# Patient Record
Sex: Male | Born: 1970 | Race: White | Hispanic: No | Marital: Married | Smoking: Never smoker
Health system: Southern US, Community
[De-identification: ages and names within clinical notes are randomized; demographics above are authoritative.]

## PROBLEM LIST (undated history)

## (undated) DIAGNOSIS — Z8489 Family history of other specified conditions: Secondary | ICD-10-CM

## (undated) DIAGNOSIS — I4510 Unspecified right bundle-branch block: Secondary | ICD-10-CM

## (undated) DIAGNOSIS — E785 Hyperlipidemia, unspecified: Secondary | ICD-10-CM

## (undated) DIAGNOSIS — Z86010 Personal history of colonic polyps: Principal | ICD-10-CM

## (undated) DIAGNOSIS — I341 Nonrheumatic mitral (valve) prolapse: Secondary | ICD-10-CM

## (undated) DIAGNOSIS — T4145XA Adverse effect of unspecified anesthetic, initial encounter: Secondary | ICD-10-CM

## (undated) DIAGNOSIS — G5600 Carpal tunnel syndrome, unspecified upper limb: Secondary | ICD-10-CM

## (undated) DIAGNOSIS — R011 Cardiac murmur, unspecified: Secondary | ICD-10-CM

## (undated) DIAGNOSIS — K625 Hemorrhage of anus and rectum: Secondary | ICD-10-CM

## (undated) DIAGNOSIS — G4730 Sleep apnea, unspecified: Secondary | ICD-10-CM

## (undated) HISTORY — DX: Hyperlipidemia, unspecified: E78.5

## (undated) HISTORY — PX: VASECTOMY: SHX75

## (undated) HISTORY — DX: Nonrheumatic mitral (valve) prolapse: I34.1

## (undated) HISTORY — PX: EYE SURGERY: SHX253

## (undated) HISTORY — DX: Personal history of colonic polyps: Z86.010

## (undated) HISTORY — DX: Cardiac murmur, unspecified: R01.1

## (undated) HISTORY — PX: COLONOSCOPY: SHX174

---

## 1994-12-12 DIAGNOSIS — T8859XA Other complications of anesthesia, initial encounter: Secondary | ICD-10-CM

## 1994-12-12 HISTORY — DX: Other complications of anesthesia, initial encounter: T88.59XA

## 1994-12-12 HISTORY — PX: NASAL TURBINATE REDUCTION: SHX2072

## 2004-12-12 HISTORY — PX: LASIK: SHX215

## 2006-04-22 ENCOUNTER — Observation Stay: Payer: Self-pay | Admitting: Vascular Surgery

## 2008-01-03 ENCOUNTER — Emergency Department (HOSPITAL_COMMUNITY): Admission: EM | Admit: 2008-01-03 | Discharge: 2008-01-03 | Payer: Self-pay | Admitting: Emergency Medicine

## 2009-09-28 ENCOUNTER — Ambulatory Visit: Payer: Self-pay | Admitting: Internal Medicine

## 2009-09-30 LAB — CONVERTED CEMR LAB
Direct LDL: 142.3 mg/dL
Glucose, Bld: 78 mg/dL (ref 70–99)
HDL: 35.9 mg/dL — ABNORMAL LOW (ref >39.00)
Triglycerides: 301 mg/dL — ABNORMAL HIGH (ref 0.0–149.0)
VLDL: 60.2 mg/dL — ABNORMAL HIGH (ref 0.0–40.0)

## 2010-04-22 ENCOUNTER — Ambulatory Visit: Payer: Self-pay | Admitting: Family Medicine

## 2010-04-22 DIAGNOSIS — IMO0002 Reserved for concepts with insufficient information to code with codable children: Secondary | ICD-10-CM | POA: Insufficient documentation

## 2010-09-30 ENCOUNTER — Telehealth: Payer: Self-pay | Admitting: Internal Medicine

## 2010-10-18 ENCOUNTER — Encounter: Payer: Self-pay | Admitting: Internal Medicine

## 2010-12-29 ENCOUNTER — Emergency Department (HOSPITAL_COMMUNITY)
Admission: EM | Admit: 2010-12-29 | Discharge: 2010-12-29 | Payer: Self-pay | Source: Home / Self Care | Admitting: Emergency Medicine

## 2011-01-11 NOTE — Miscellaneous (Signed)
Summary: Flu vaccine  Clinical Lists Changes  Observations: Added new observation of FLU VAX: Historical (10/13/2010 16:23)      Influenza Immunization History:    Influenza # 1:  Historical (10/13/2010) Received from from [facility] [address], [place], [place]

## 2011-01-11 NOTE — Assessment & Plan Note (Signed)
Summary: RIGHT KNEE PAIN/CLE   Vital Signs:  Patient profile:   40 year old male Height:      73 inches Weight:      293.6 pounds BMI:     38.88 Temp:     98.2 degrees F oral Pulse rhythm:   regular BP sitting:   118 / 82  (left arm) Cuff size:   large  Vitals Entered By: Benny Lennert CMA Duncan Dull) (Apr 22, 2010 3:52 PM)  History of Present Illness: Chief complaint right knee pain  the patient is a very friendly [place] gentleman who struck his right knee approximately 5 years ago, and after then, he subsequently developed medial knee pain.  Intermittently, this has not bothered him always, however he has gained approximately 50 pounds since he got married.  He does work in an [employer], and stoops and squats every day. He is unable to go down on his right knee, and has pain almost always when he flexes his right knee.  He was actually evaluated in  [place] previously, and he had a knee MRI that point, which showed his medial meniscal tear.  I confirmed this over the telephone with his wife, who is a  Primary school teacher.  The actual films and his report are not available for my review.  Now he has had worsening knee pain. He does describe 2 instances when it had mechanically locked up.  Does have pain going up and down stairs, does have pain with flexing and  twisting his knee.  Allergies (verified): No Known Drug Allergies  Past History:  Past medical, surgical, family and social histories (including risk factors) reviewed, and no changes noted (except as noted below).  Past Medical History: Reviewed history from 09/28/2009 and no changes required. Unremarkable  Past Surgical History: Reviewed history from 09/28/2009 and no changes required. 1996 Turbinate reduction 2006 Lasik  Family History: Reviewed history from 09/28/2009 and no changes required. Mom has multiple sclerosis--disabled now Dad healthy 1 sister--healthy 2 children with  asthma  Social History: Reviewed history from 09/28/2009 and no changes required. 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 Otherwise the pertinent positives of the ROS are noted above.    Physical Exam  General:  GEN: Well-developed,well-nourished,in no acute distress; alert,appropriate and cooperative throughout examination HEENT: Normocephalic and atraumatic without obvious abnormalities. No apparent alopecia or balding. Ears, externally no deformities PULM: Breathing comfortably in no respiratory distress EXT: No clubbing, cyanosis, or edema PSYCH: Normally interactive. Cooperative during the interview. Pleasant. Friendly and conversant. Not anxious or depressed appearing. Normal, full affect.  Msk:  right knee: Full extension. Flexion to approximately 120.  Nontender with patellar facet loading.  Negative grind test.  Nontender lateral joint line. Medial joint line is tender, notably posteriorly.  Strength is intact, 5/5.  Negative Lachman. Stable to varus and about distress. Negative anterior and posterior drawer  positive flexion pinch test.  Positive McMurray's test. The mcmurrays positive for pain, but no mechanical symptoms are felt.   Impression & Recommendations:  Problem # 1:  TEAR MEDIAL CARTILAGE OR MENISCUS KNEE CURRENT (ICD-836.0) DOI approx 04/11/2005  >25 minutes spent in face to face time with patient, >50% spent in counselling or coordination  of care:  I extensively reviewed the anatomy and different treatment options. Discussed the case with the patient and his wife.  This highly active 65 year old gentleman. He is a known medial meniscal  tear defined on MRI. I suspect that this is still present, and may have enlarged. Classic history and exam.  At this point, I believe it would be most helpful to discuss this case with Orthopedic Surgery, consult Dr. Ave Filter for evaluation and definitive management.  Orders: Orthopedic Surgeon Referral (Ortho Surgeon)  Patient Instructions: 1)  Referral Appointment Information 2)  Day/Date: 3)  Time: 4)  Place/MD: 5)  Address: 6)  Phone/Fax: 7)  Patient given appointment information. Information/Orders faxed/mailed.   Prior Medications (reviewed today): None Current Allergies (reviewed today): No known allergies

## 2011-01-11 NOTE — Progress Notes (Signed)
Summary: Orthopedic appt cancelled....  Phone Note Outgoing Call   Summary of Call: Othop appt cancelled. Pt was referred to a othop physician in May for his knee. Pt cancelled appt. I spoke w/ the pt says his knee is much better, no problems.Daine Gip  September 30, 2010 11:36 AM   Follow-up for Phone Call        noted Follow-up by: Cindee Salt MD,  September 30, 2010 1:28 PM

## 2011-04-08 ENCOUNTER — Encounter: Payer: Self-pay | Admitting: Internal Medicine

## 2011-04-11 ENCOUNTER — Ambulatory Visit: Payer: Self-pay | Admitting: Internal Medicine

## 2011-04-18 ENCOUNTER — Ambulatory Visit (INDEPENDENT_AMBULATORY_CARE_PROVIDER_SITE_OTHER): Payer: Self-pay | Admitting: Internal Medicine

## 2011-04-18 ENCOUNTER — Encounter: Payer: Self-pay | Admitting: Internal Medicine

## 2011-04-18 VITALS — BP 120/80 | HR 62 | Temp 98.5°F | Ht 73.0 in | Wt 296.0 lb

## 2011-04-18 DIAGNOSIS — M7520 Bicipital tendinitis, unspecified shoulder: Secondary | ICD-10-CM

## 2011-04-18 DIAGNOSIS — F4329 Adjustment disorder with other symptoms: Secondary | ICD-10-CM

## 2011-04-18 DIAGNOSIS — F4323 Adjustment disorder with mixed anxiety and depressed mood: Secondary | ICD-10-CM

## 2011-04-18 NOTE — Patient Instructions (Signed)
Please try 1-2 aleve twice a day and heat on the tendonitis area  Please call if your mood issues worsen, we can consider a trial of medication

## 2011-04-18 NOTE — Progress Notes (Signed)
  Subjective:    Patient ID: [name], male    DOB: 10-04-1971, 40 y.o.   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 Went back. Wife upset that she didn't go-- "who would watch the kids" Finds himself snapping at kids easy Getting upset easy Some degreee of anhedonia Sleeping okay Overeating with the stress  Current outpatient prescriptions:naproxen (NAPROSYN) 250 MG tablet, Take 250 mg by mouth 2 (two) times daily with a meal.  , Disp: , Rfl:   No past medical history on file.  Past Surgical History  Procedure Date  . Nasal turbinate reduction 1996  . Lasik 2006    Family History  Problem Relation Age of Onset  . Multiple sclerosis Mother     History   Social History  . Marital Status: Married    Spouse Name: N/A    Number of Children: N/A  . Years of Education: N/A   Occupational History  . Not on file.   Social History Main Topics  . Smoking status: Never Smoker   . Smokeless tobacco: Never Used  . Alcohol Use: Yes     occasional beer/wine  . Drug Use: Not on file  . Sexually Active: Not on file   Other Topics Concern  . Not on file   Social History Narrative  . No narrative on file   Review of Systems No other sig joint problems--does have occ knee pain Weight up 11# in a year     Objective:   Physical Exam  Constitutional: He appears well-developed and well-nourished. No distress.  Musculoskeletal:       Pain over biceps tendon on  right Normal ROM in elbow No bony tenderness  Neurological:       Normal strength in arms  Psychiatric: His behavior is normal. Judgment and thought content normal.       Melancholy Grieving for mom          Assessment & Plan:

## 2011-04-21 ENCOUNTER — Encounter: Payer: Self-pay | Admitting: Internal Medicine

## 2011-04-21 ENCOUNTER — Ambulatory Visit (INDEPENDENT_AMBULATORY_CARE_PROVIDER_SITE_OTHER): Payer: Self-pay | Admitting: Internal Medicine

## 2011-04-21 VITALS — BP 110/80 | HR 74 | Temp 99.0°F | Wt 294.0 lb

## 2011-04-21 DIAGNOSIS — R10A1 Flank pain, right side: Secondary | ICD-10-CM

## 2011-04-21 DIAGNOSIS — M549 Dorsalgia, unspecified: Secondary | ICD-10-CM

## 2011-04-21 DIAGNOSIS — R109 Unspecified abdominal pain: Secondary | ICD-10-CM

## 2011-04-21 LAB — POCT URINALYSIS DIPSTICK
Bilirubin, UA: NEGATIVE
Blood, UA: NEGATIVE
Ketones, UA: NEGATIVE
Protein, UA: NEGATIVE
pH, UA: 7

## 2011-04-21 NOTE — Patient Instructions (Signed)
Call if your pain comes back again

## 2011-04-21 NOTE — Progress Notes (Signed)
  Subjective:    Patient ID: [name], male    DOB: [birth year], 40 y.o.   MRN: [id number]  HPI Felt sharp pain and nausea in left upper back 2 nights ago Seemed to radiate down towards lower abdomen Continued to yesterday morning  Now better No history of kidney stones No hematuria  Did have some pain this AM None right now  Came on first in shower May be more when stretching arm up---not always consistent Working in [employer] yesterday and today Intermittent yesterday but better since the AM  No dysuria or urinary changes  Current outpatient prescriptions:naproxen (NAPROSYN) 250 MG tablet, Take 250 mg by mouth 2 (two) times daily with a meal.  , Disp: , Rfl:   No past medical history on file.  Past Surgical History  Procedure Date  . Nasal turbinate reduction 1996  . Lasik 2006    Family History  Problem Relation Age of Onset  . Multiple sclerosis Mother     History   Social History  . Marital Status: Married    Spouse Name: N/A    Number of Children: N/A  . Years of Education: N/A   Occupational History  . Not on file.   Social History Main Topics  . Smoking status: Never Smoker   . Smokeless tobacco: Never Used  . Alcohol Use: Yes     occasional beer/wine  . Drug Use: Not on file  . Sexually Active: Not on file   Other Topics Concern  . Not on file   Social History Narrative  . No narrative on file   Review of Systems No cough Did feel briefly SOB with the pain occ (very transient) Appetite is not changed    Objective:   Physical Exam  Constitutional: He appears well-developed and well-nourished. No distress.  Abdominal: Soft. He exhibits no mass. There is no tenderness.  Musculoskeletal:       No CVA tenderness now (but pain spot is right CVA area)  Normal gait and strength          Assessment & Plan:

## 2011-06-30 ENCOUNTER — Other Ambulatory Visit: Payer: Self-pay | Admitting: *Deleted

## 2011-06-30 MED ORDER — CLOBETASOL PROPIONATE 0.05 % EX OINT: TOPICAL_OINTMENT | Freq: Two times a day (BID) | CUTANEOUS | Status: DC | PRN

## 2011-06-30 NOTE — Telephone Encounter (Signed)
Rx sent electronically  Please let him know

## 2011-06-30 NOTE — Telephone Encounter (Signed)
Pt is requesting refill on clobetasol cream for his psoriasis.  This is not on med list, he states he has gotten it from you before.  Uses [facility] in [place].

## 2011-06-30 NOTE — Telephone Encounter (Signed)
Spoke with patient and advised results   

## 2011-09-02 LAB — POCT CARDIAC MARKERS
Operator id: 196461
Operator id: 196461
Troponin i, poc: <0.05
Troponin i, poc: <0.05

## 2011-09-02 LAB — DIFFERENTIAL
Basophils Absolute: 0
Basophils Relative: 0
Eosinophils Absolute: 0.1
Eosinophils Relative: 2
Monocytes Absolute: 0.3

## 2011-09-02 LAB — CBC
HCT: 45
Hemoglobin: 15.5
MCHC: 34.3
Platelets: 232
RDW: 12.4

## 2011-09-02 LAB — I-STAT 8, (EC8 V) (CONVERTED LAB)
Acid-Base Excess: 4 — ABNORMAL HIGH
Bicarbonate: 31.6 — ABNORMAL HIGH
Chloride: 105
TCO2: 33
pCO2, Ven: 56.9 — ABNORMAL HIGH
pH, Ven: 7.353 — ABNORMAL HIGH

## 2011-09-02 LAB — POCT I-STAT CREATININE: Operator id: 196461

## 2011-09-02 LAB — HEPATIC FUNCTION PANEL
ALT: 51
AST: 29
Albumin: 4.3
Total Bilirubin: 1

## 2011-12-26 ENCOUNTER — Ambulatory Visit: Payer: Self-pay | Admitting: Family Medicine

## 2011-12-26 ENCOUNTER — Encounter: Payer: Self-pay | Admitting: Internal Medicine

## 2011-12-26 ENCOUNTER — Ambulatory Visit (INDEPENDENT_AMBULATORY_CARE_PROVIDER_SITE_OTHER): Payer: Self-pay | Admitting: Internal Medicine

## 2011-12-26 VITALS — BP 110/80 | HR 64 | Temp 98.0°F | Wt 294.0 lb

## 2011-12-26 DIAGNOSIS — Z733 Stress, not elsewhere classified: Secondary | ICD-10-CM

## 2011-12-26 DIAGNOSIS — F439 Reaction to severe stress, unspecified: Secondary | ICD-10-CM | POA: Insufficient documentation

## 2011-12-26 DIAGNOSIS — K625 Hemorrhage of anus and rectum: Secondary | ICD-10-CM

## 2011-12-26 NOTE — Assessment & Plan Note (Signed)
Not really upper GI Has some nausea but generally intestinal symptoms Rectal exam shows no hemorrhoids or apparent source for his bleeding  Very suspicious for colitis Will check labs Set up with GI

## 2011-12-26 NOTE — Progress Notes (Signed)
  Subjective:    Patient ID: [name], male    DOB: 07-04-1971, 41 y.o.   MRN: [id number]  HPI Here at wife's urging  His nerves are a problem Stomach acts up Wife is concerned due to inappropriate communication he had in the past with another woman He is open with shared e-mail account and she was upset about unprofessional nature of e-mail from lady realtor  Feels his wife may be self concious since she is post partum,etc She even gets upset if he works out ---wonders why he is trying to make himself look better---or at least he should be spending the time with her Not much privacy with all their children---teenager and baby!! 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    No Known Allergies  No past medical history on file.  Past Surgical History  Procedure Date  . Nasal turbinate reduction 1996  . Lasik 2006    Family History  Problem Relation Age of Onset  . Multiple sclerosis Mother     History   Social History  . Marital Status: Married    Spouse Name: N/A    Number of Children: N/A  . Years of Education: N/A   Occupational History  . Not on file.   Social History Main Topics  . Smoking status: Never Smoker   . Smokeless tobacco: Never Used  . Alcohol Use: Yes     occasional beer/wine  . Drug Use: Not on file  . Sexually Active: Not on file   Other Topics Concern  .  Not on file   Social History Narrative  . No narrative on file   Review of Systems Still gets anxious and upset at times--esp at work Wife really watches over him --- observing while he is working, monitors the video New young woman employee--wife is watching and concerned due to past infidelity Moves his bowels frequently---at least 2-3 per day    Objective:   Physical Exam  Constitutional: He appears well-developed and well-nourished. No distress.  Neck: Normal range of motion. Neck supple. No thyromegaly present.  Cardiovascular: Normal rate, regular rhythm and normal heart sounds.  Exam reveals no gallop.   No murmur heard. Pulmonary/Chest: Effort normal and breath sounds normal. No respiratory distress. He has no wheezes. He has no rales.  Abdominal: Soft. Bowel sounds are normal. He exhibits no distension and no mass. There is no rebound and no guarding.       Mild RLQ tenderness and slightly greater LLQ tenderness  Musculoskeletal: He exhibits no edema and no tenderness.  Lymphadenopathy:    He has no cervical adenopathy.  Psychiatric:       Mild anxiety Doesn't seem to be depressed          Assessment & Plan:

## 2011-12-26 NOTE — Assessment & Plan Note (Signed)
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

## 2011-12-27 ENCOUNTER — Encounter: Payer: Self-pay | Admitting: Internal Medicine

## 2011-12-27 LAB — CBC WITH DIFFERENTIAL/PLATELET
Basophils Absolute: 0.1 10*3/uL (ref 0.0–0.1)
Eosinophils Absolute: 0.2 10*3/uL (ref 0.0–0.7)
HCT: 43 % (ref 39.0–52.0)
Lymphs Abs: 2.3 10*3/uL (ref 0.7–4.0)
MCV: 90.4 fl (ref 78.0–100.0)
Monocytes Absolute: 0.5 10*3/uL (ref 0.1–1.0)
Platelets: 232 10*3/uL (ref 150.0–400.0)
RDW: 12.1 % (ref 11.5–14.6)

## 2011-12-27 LAB — BASIC METABOLIC PANEL
BUN: 20 mg/dL (ref 6–23)
Chloride: 104 mEq/L (ref 96–112)
GFR: 85.55 mL/min (ref >60.00)
Glucose, Bld: 84 mg/dL (ref 70–99)
Potassium: 4.8 mEq/L (ref 3.5–5.1)

## 2011-12-27 LAB — TSH: TSH: 1.94 u[IU]/mL (ref 0.35–5.50)

## 2011-12-27 LAB — HEPATIC FUNCTION PANEL: Albumin: 4.6 g/dL (ref 3.5–5.2)

## 2012-01-03 ENCOUNTER — Encounter: Payer: Self-pay | Admitting: Internal Medicine

## 2012-01-03 ENCOUNTER — Ambulatory Visit (INDEPENDENT_AMBULATORY_CARE_PROVIDER_SITE_OTHER): Payer: Self-pay | Admitting: Internal Medicine

## 2012-01-03 DIAGNOSIS — R141 Gas pain: Secondary | ICD-10-CM

## 2012-01-03 DIAGNOSIS — R140 Abdominal distension (gaseous): Secondary | ICD-10-CM | POA: Insufficient documentation

## 2012-01-03 DIAGNOSIS — R198 Other specified symptoms and signs involving the digestive system and abdomen: Secondary | ICD-10-CM

## 2012-01-03 DIAGNOSIS — K625 Hemorrhage of anus and rectum: Secondary | ICD-10-CM

## 2012-01-03 MED ORDER — PEG-KCL-NACL-NASULF-NA ASC-C 100 G PO SOLR: 1.0000 | Freq: Once | ORAL | Status: DC

## 2012-01-03 NOTE — Assessment & Plan Note (Signed)
Still might be anorectal but the persistence and lack of hemorrhoids on exam warrants a colonoscopy. The patient understands and agrees to proceed.

## 2012-01-03 NOTE — Patient Instructions (Signed)
You have been scheduled for a Colonoscopy with separate instructions given. Your prep kit has been sent to your pharmacy for you to pick up. 

## 2012-01-03 NOTE — Progress Notes (Signed)
Subjective:    Patient ID: [name], male    DOB: Oct 25, 1971, 41 y.o.   MRN: [id number]  HPI This pleasant 41 year old white man presents for a 2-3 month history of intermittent rectal bleeding. His stools are more frequent and somewhat loose at times. Mostly the blood has been on the toilet paper or small amounts of the commode though 2 weeks ago he had a large amount of blood in the stool. It has been bright red. He saw primary care and no hemorrhoids were noted. He was sent here for further evaluation. These problems are accompanied by intermittent cramps and bloating. He can have these after eating, [place] food is a particular cause. He has not had that about a month. He says the bloating symptoms are not particularly bothersome and he can live with them but he is concerned about the bleeding. He's been on a probiotic for about 2 days, no change noted at this point. No Known Allergies No outpatient prescriptions prior to visit.   History reviewed. No pertinent past medical history. Past Surgical History  Procedure Date  . Nasal turbinate reduction 1996  . Lasik 2006    bilateral   History   Social History  . Marital Status: Married    Spouse Name: N/A    Number of Children: N/A  . 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 Smoking status: Never Smoker   . Smokeless tobacco: Never Used  . Alcohol Use: Yes     occasional beer/wine  . Drug Use: No  . Sexually Active: None   Other Topics Concern  . None   Social History Narrative  . None   Family History  Problem Relation Age of Onset  . Multiple sclerosis Mother   . Asthma      children; seasonal  . Colon cancer Neg Hx   . Colitis Father     questionable  . Arthritis Son     Wilder Glade rheumatoid         Review of Systems There is some interpersonal stress with his wife, she is recently postpartum, they have 4 children and there is  some stress there. She has some signs about his interactions with other females, and there has been instability in the past. He is having poor sleep at times and does have some anxiety overall this, admittedly. All other review of systems negative except as mentioned above.    Objective:   Physical Exam General:  Well-developed, well-nourished and in no acute distress Eyes:  anicteric. ENT:   Mouth and posterior pharynx free of lesions.  Neck:   supple w/o thyromegaly or mass.  Lungs: Clear to auscultation bilaterally. Heart:  S1S2, no rubs, murmurs, gallops. Abdomen:  soft, non-tender, no hepatosplenomegaly, hernia, or mass and BS+.  Rectal: Normal Tone and anoderm. Light brown stool. No mass. Prostate normal. Anoscopy   exam does not reveal obvious hemorrhoids. Lymph:  no cervical or supraclavicular adenopathy. Extremities:   no edema Skin   no rash. Neuro:  A&O x 3.  Psych:  appropriate mood and  Affect.   Data Reviewed: Recent CBC is normal, Dr. Alphonsus Sias notes are reviewed.       Assessment & Plan:   1. Change in bowel function - mild   2. Bloating   3. Rectal bleeding        Constellation of rectal bleeding, bloating and some change in  bowel habits. Etiology not clear. I couldn't find hemorrhoids though he could have those a little further up in the rectum. Given the overall constellation of problems, colonoscopy is recommended due to his chronic changes here, he could have some type of early inflammatory bowel disease problem, colorectal neoplasia is in the differential as well though less likely.The risks and benefits as well as alternatives of endoscopic procedure(s) have been discussed and reviewed. All questions answered. The patient agrees to proceed.  Is not interested in symptomatic therapy at this time. For the most part he is able to tolerate the bloating symptoms, he is most concerned about the bleeding and what it might represent.

## 2012-01-05 ENCOUNTER — Other Ambulatory Visit: Payer: Self-pay | Admitting: Internal Medicine

## 2012-01-19 ENCOUNTER — Encounter: Payer: Self-pay | Admitting: Internal Medicine

## 2012-01-19 ENCOUNTER — Ambulatory Visit (AMBULATORY_SURGERY_CENTER): Payer: Self-pay | Admitting: Internal Medicine

## 2012-01-19 VITALS — BP 124/72 | HR 73 | Temp 95.5°F | Resp 20 | Ht 76.0 in | Wt 294.0 lb

## 2012-01-19 DIAGNOSIS — D126 Benign neoplasm of colon, unspecified: Secondary | ICD-10-CM

## 2012-01-19 DIAGNOSIS — K921 Melena: Secondary | ICD-10-CM

## 2012-01-19 MED ORDER — SODIUM CHLORIDE 0.9 % IV SOLN: 500.0000 mL | INTRAVENOUS | Status: DC

## 2012-01-19 NOTE — Patient Instructions (Addendum)
One small polyp was removed. It was not causing problems but we removed them so they do not grow into problems or cancer in the future. The anal canal appeared irritated and I think you have bleeding from there at times. No hemorrhoids were seen. Please read the information provided and refer to the endoscopy report also. Iva Boop, MD, F FOLLOW DISCHARGE INSTRUCTIONS [facility] AND GREEN SHEETS).Marland Kitchen

## 2012-01-19 NOTE — Progress Notes (Signed)
Patient did not experience any of the following events: a burn prior to discharge; a fall within the facility; wrong site/side/patient/procedure/implant event; or a hospital transfer or hospital admission upon discharge from the facility. (G8907) Patient did not have preoperative order for IV antibiotic SSI prophylaxis. (G8918)  

## 2012-01-19 NOTE — Op Note (Signed)
[facility] [address] [place], [place]  [address]  COLONOSCOPY PROCEDURE REPORT  PATIENT:  [name], [name]  MR#:  [id number] BIRTHDATE:  August 21, 1971, 40 yrs. old  GENDER:  male ENDOSCOPIST:  Iva Boop, MD, [facility] REF. BY:  Tillman Abide, M.D. PROCEDURE DATE:  01/19/2012 PROCEDURE:  Colonoscopy with snare polypectomy ASA CLASS:  Class I INDICATIONS:  hematochezia, change in bowel habits Small volume bleeding and some change in bowel habits - loose stools MEDICATIONS:   These medications were titrated to patient response per physician's verbal order, Fentanyl 75 mcg IV, Versed 7 mg IV  DESCRIPTION OF PROCEDURE:   After the risks benefits and alternatives of the procedure were thoroughly explained, informed consent was obtained.  Digital rectal exam was performed and revealed no abnormalities.   The [id number] endoscope was introduced through the anus and advanced to the cecum, which was identified by both the appendix and ileocecal valve, without limitations.  The quality of the prep was excellent, using MoviPrep.  The instrument was then slowly withdrawn as the colon was fully examined. <<PROCEDUREIMAGES>>  FINDINGS:  A diminutive polyp (5mm) was found in the sigmoid colon. Polyp was snared without cautery. Retrieval was successful. snare polyp  This was otherwise a normal examination of the colon. Erythematous anal mucosa seen  The time to cecum = 5:50 minutes. The scope was then withdrawn in 13:08 minutes from the cecum and the procedure completed. COMPLICATIONS:  None ENDOSCOPIC IMPRESSION: 1) Diminutive polyp in the sigmoid colon - removed 2) Erythematous anal mucosa 3) Otherwise normal examination - excellent prep RECOMMENDATIONS: Sounds like bleeding has improved, I think it was anorectal - if he has recurrent bleeding problems would use hydrocortisone suppositories or cream to se if it helps as the anal canal appears somewhat inflamed today (in  part probably from the prep but seems like he has had anorectal bleeding).  See GI as needed REPEAT EXAM:  In for Colonoscopy, pending biopsy results.  Iva Boop, MD, [facility]  CC:  Karie Schwalbe, MD and The Patient  n. eSIGNED:   Iva Boop at 01/19/2012 03:23 PM  Letha Cape, [phone]

## 2012-01-20 ENCOUNTER — Telehealth: Payer: Self-pay | Admitting: *Deleted

## 2012-01-20 NOTE — Telephone Encounter (Signed)
  Follow up Call-  Call back number 01/19/2012  Post procedure Call Back phone  # [phone] cell (patient's)  Permission to leave phone message Yes     Patient questions:  Do you have a fever, pain , or abdominal swelling? no Pain Score  0 *  Have you tolerated food without any problems? yes  Have you been able to return to your normal activities? yes  Do you have any questions about your discharge instructions: Diet   no Medications  no Follow up visit  no  Do you have questions or concerns about your Care? no  Actions: * If pain score is 4 or above: No action needed, pain <4.

## 2012-01-24 ENCOUNTER — Encounter: Payer: Self-pay | Admitting: Internal Medicine

## 2012-01-24 DIAGNOSIS — Z860100 Personal history of colon polyps, unspecified: Secondary | ICD-10-CM

## 2012-01-24 HISTORY — DX: Personal history of colonic polyps: Z86.010

## 2012-01-24 HISTORY — DX: Personal history of colon polyps, unspecified: Z86.0100

## 2012-01-24 NOTE — Progress Notes (Signed)
Quick Note:  5 mm adenoma Routine repeat colonoscopy approx 01/2017 ______

## 2012-02-13 ENCOUNTER — Encounter (HOSPITAL_COMMUNITY): Payer: Self-pay | Admitting: Emergency Medicine

## 2012-02-13 ENCOUNTER — Emergency Department (HOSPITAL_COMMUNITY): Payer: Medicaid Other

## 2012-02-13 ENCOUNTER — Emergency Department (HOSPITAL_COMMUNITY)
Admission: EM | Admit: 2012-02-13 | Discharge: 2012-02-14 | Disposition: A | Discharge status: Home / Self Care | Payer: Medicaid Other | Attending: Emergency Medicine | Admitting: Emergency Medicine

## 2012-02-13 DIAGNOSIS — M25569 Pain in unspecified knee: Secondary | ICD-10-CM | POA: Insufficient documentation

## 2012-02-13 DIAGNOSIS — M25561 Pain in right knee: Secondary | ICD-10-CM

## 2012-02-13 DIAGNOSIS — Z87828 Personal history of other (healed) physical injury and trauma: Secondary | ICD-10-CM | POA: Insufficient documentation

## 2012-02-13 NOTE — ED Notes (Signed)
PT. REPORTS RIGHT KNEE PAIN FOR 4 DAYS , DENIES INJURY OR FALL , AMBULATORY.

## 2012-02-14 MED ORDER — HYDROCODONE-ACETAMINOPHEN 5-325 MG PO TABS: 1.0000 | ORAL_TABLET | Freq: Four times a day (QID) | ORAL | Status: AC | PRN

## 2012-02-14 NOTE — ED Notes (Signed)
Reports pain to rgt knee - just medial to patella as well as proximal to patella - reports previous injury x7 yrs ago - states was told he had a "small tear" to his Lateral meniscus - no surgical intervention needed at that time per pt; reports continued pain interm since initial injury - normally takes motrin for pain - has not been helping during this acute exacerbation; no new injury per pt; small amt of swelling noted around rgt patella area; ambulates without difficulty; reports pain worsens with flexion and extension of rgt knee

## 2012-02-14 NOTE — ED Provider Notes (Signed)
History     CSN: [id number]  Arrival date & time 02/13/12  2250   First MD Initiated Contact with Patient 02/14/12 0203      Chief Complaint  Patient presents with  . Knee Pain    (Consider location/radiation/quality/duration/timing/severity/associated sxs/prior treatment) HPI This is a 41 year old male who injured his right meniscus about 7 years ago. He has had intermittent pain in the right knee since then. He complains of moderate to severe pain in the right knee for about the last 4 days. He denies specific injury that precipitated this. The pain is deep to the patella and worse when attempting to flex the knee in a straight position, improved when attempting to flex the knee at about 90. There is no associated swelling or deformity. It is exacerbated by ambulation. He states an MRI of the knee was once considered but he was lost to followup.  History reviewed. No pertinent past medical history.  Past Surgical History  Procedure Date  . Nasal turbinate reduction 1996  . Lasik 2006    bilateral    Family History  Problem Relation Age of Onset  . Multiple sclerosis Mother   . Asthma      children; seasonal  . Colon cancer Neg Hx   . Colitis Father     questionable  . Arthritis Son     juvinile rheumatoid    History  Substance Use Topics  . Smoking status: Never Smoker   . Smokeless tobacco: Never Used  . Alcohol Use: Yes     occasional beer/wine      Review of Systems  All other systems reviewed and are negative.    Allergies  Review of patient's allergies indicates no known allergies.  Home Medications   Current Outpatient Rx  Name Route Sig Dispense Refill  . IBUPROFEN 800 MG PO TABS Oral Take 800 mg by mouth every 8 (eight) hours as needed. For pain    . MULTI-VITAMIN/MINERALS PO TABS Oral Take 1 tablet by mouth daily as needed.      BP 111/72  Pulse 95  Temp(Src) 98 F (36.7 C) (Oral)  Resp 16  SpO2 95%  Physical Exam General:  Well-developed, well-nourished male in no acute distress; appearance consistent with age of record HENT: normocephalic, atraumatic Eyes: pupils equal round and reactive to light; extraocular muscles intact Neck: supple Heart: regular rate and rhythm Lungs: clear to auscultation bilaterally Abdomen: soft; nondistended; nontender Extremities: No deformity; full range of motion; pulses normal; pain on range of motion right knee without deformity or swelling Neurologic: Awake, alert and oriented; motor function intact in all extremities and symmetric; no facial droop Skin: Warm and dry Psychiatric: Normal mood and affect    ED Course  Procedures (including critical care time)   MDM  We'll refer to Dr. Ranell Patrick of [facility].         Hanley Seamen, MD 02/14/12 [phone]

## 2012-02-14 NOTE — Discharge Instructions (Signed)
Knee Pain The knee is the complex joint between your thigh and your lower leg. It is made up of bones, tendons, ligaments, and cartilage. The bones that make up the knee are:  The femur in the thigh.   The tibia and fibula in the lower leg.   The patella or kneecap riding in the groove on the lower femur.  CAUSES  Knee pain is a common complaint with many causes. A few of these causes are:  Injury, such as:   A ruptured ligament or tendon injury.   Torn cartilage.   Medical conditions, such as:   Gout   Arthritis   Infections   Overuse, over training or overdoing a physical activity.  Knee pain can be minor or severe. Knee pain can accompany debilitating injury. Minor knee problems often respond well to self-care measures or get well on their own. More serious injuries may need medical intervention or even surgery. SYMPTOMS The knee is complex. Symptoms of knee problems can vary widely. Some of the problems are:  Pain with movement and weight bearing.   Swelling and tenderness.   Buckling of the knee.   Inability to straighten or extend your knee.   Your knee locks and you cannot straighten it.   Warmth and redness with pain and fever.   Deformity or dislocation of the kneecap.  DIAGNOSIS  Determining what is wrong may be very straight forward such as when there is an injury. It can also be challenging because of the complexity of the knee. Tests to make a diagnosis may include:  Your caregiver taking a history and doing a physical exam.   Routine X-rays can be used to rule out other problems. X-rays will not reveal a cartilage tear. Some injuries of the knee can be diagnosed by:   Arthroscopy a surgical technique by which a small video camera is inserted through tiny incisions on the sides of the knee. This procedure is used to examine and repair internal knee joint problems. Tiny instruments can be used during arthroscopy to repair the torn knee cartilage  (meniscus).   Arthrography is a radiology technique. A contrast liquid is directly injected into the knee joint. Internal structures of the knee joint then become visible on X-ray film.   An MRI scan is a non x-ray radiology procedure in which magnetic fields and a computer produce two- or three-dimensional images of the inside of the knee. Cartilage tears are often visible using an MRI scanner. MRI scans have largely replaced arthrography in diagnosing cartilage tears of the knee.   Blood work.   Examination of the fluid that helps to lubricate the knee joint (synovial fluid). This is done by taking a sample out using a needle and a syringe.  TREATMENT The treatment of knee problems depends on the cause. Some of these treatments are:  Depending on the injury, proper casting, splinting, surgery or physical therapy care will be needed.   Give yourself adequate recovery time. Do not overuse your joints. If you begin to get sore during workout routines, back off. Slow down or do fewer repetitions.   For repetitive activities such as cycling or running, maintain your strength and nutrition.   Alternate muscle groups. For example if you are a weight lifter, work the upper body on one day and the lower body the next.   Either tight or weak muscles do not give the proper support for your knee. Tight or weak muscles do not absorb the stress placed   on the knee joint. Keep the muscles surrounding the knee strong.   Take care of mechanical problems.   If you have flat feet, orthotics or special shoes may help. See your caregiver if you need help.   Arch supports, sometimes with wedges on the inner or outer aspect of the heel, can help. These can shift pressure away from the side of the knee most bothered by osteoarthritis.   A brace called an "unloader" brace also may be used to help ease the pressure on the most arthritic side of the knee.   If your caregiver has prescribed crutches, braces,  wraps or ice, use as directed. The acronym for this is PRICE. This means protection, rest, ice, compression and elevation.   Nonsteroidal anti-inflammatory drugs (NSAID's), can help relieve pain. But if taken immediately after an injury, they may actually increase swelling. Take NSAID's with food in your stomach. Stop them if you develop stomach problems. Do not take these if you have a history of ulcers, stomach pain or bleeding from the bowel. Do not take without your caregiver's approval if you have problems with fluid retention, heart failure, or kidney problems.   For ongoing knee problems, physical therapy may be helpful.   Glucosamine and chondroitin are over-the-counter dietary supplements. Both may help relieve the pain of osteoarthritis in the knee. These medicines are different from the usual anti-inflammatory drugs. Glucosamine may decrease the rate of cartilage destruction.   Injections of a corticosteroid drug into your knee joint may help reduce the symptoms of an arthritis flare-up. They may provide pain relief that lasts a few months. You may have to wait a few months between injections. The injections do have a small increased risk of infection, water retention and elevated blood sugar levels.   Hyaluronic acid injected into damaged joints may ease pain and provide lubrication. These injections may work by reducing inflammation. A series of shots may give relief for as long as 6 months.   Topical painkillers. Applying certain ointments to your skin may help relieve the pain and stiffness of osteoarthritis. Ask your pharmacist for suggestions. Many over the-counter products are approved for temporary relief of arthritis pain.   In some countries, doctors often prescribe topical NSAID's for relief of chronic conditions such as arthritis and tendinitis. A review of treatment with NSAID creams found that they worked as well as oral medications but without the serious side effects.    PREVENTION  Maintain a healthy weight. Extra pounds put more strain on your joints.   Get strong, stay limber. Weak muscles are a common cause of knee injuries. Stretching is important. Include flexibility exercises in your workouts.   Be smart about exercise. If you have osteoarthritis, chronic knee pain or recurring injuries, you may need to change the way you exercise. This does not mean you have to stop being active. If your knees ache after jogging or playing basketball, consider switching to swimming, water aerobics or other low-impact activities, at least for a few days a week. Sometimes limiting high-impact activities will provide relief.   Make sure your shoes fit well. Choose footwear that is right for your sport.   Protect your knees. Use the proper gear for knee-sensitive activities. Use kneepads when playing volleyball or laying carpet. Buckle your seat belt every time you drive. Most shattered kneecaps occur in car accidents.   Rest when you are tired.  SEEK MEDICAL CARE IF:  You have knee pain that is continual and does not   seem to be getting better.  SEEK IMMEDIATE MEDICAL CARE IF:  Your knee joint feels hot to the touch and you have a high fever. MAKE SURE YOU:   Understand these instructions.   Will watch your condition.   Will get help right away if you are not doing well or get worse.  Document Released: 09/25/2007 Document Revised: 11/17/2011 Document Reviewed: 09/25/2007 Exit[employer] Patient Information 2012 [employer]. 

## 2012-02-24 ENCOUNTER — Encounter (HOSPITAL_COMMUNITY): Payer: Self-pay

## 2012-02-24 ENCOUNTER — Other Ambulatory Visit: Payer: Self-pay | Admitting: Orthopedic Surgery

## 2012-02-24 ENCOUNTER — Encounter (HOSPITAL_COMMUNITY): Payer: Self-pay | Admitting: Pharmacy Technician

## 2012-02-24 ENCOUNTER — Encounter (HOSPITAL_COMMUNITY)
Admission: RE | Admit: 2012-02-24 | Discharge: 2012-02-24 | Disposition: A | Discharge status: Home / Self Care | Payer: Medicaid Other | Source: Ambulatory Visit | Attending: Orthopedic Surgery | Admitting: Orthopedic Surgery

## 2012-02-24 HISTORY — DX: Hemorrhage of anus and rectum: K62.5

## 2012-02-24 NOTE — Pre-Procedure Instructions (Addendum)
20 Pavan Bring  02/24/2012   Your procedure is scheduled on:  Monday, March 18th.  Report to [facility] at 1000AM.  Call this number if you have problems the morning of surgery: [phone]   Remember:   Do not eat food:After Midnight.  May have clear liquids: up to 4 Hours before arrival.  Clear liquids include soda, tea, black coffee, apple or grape juice, broth.  Take these medicines the morning of surgery with A SIP OF WATER: Hydrocodone- Acetaminophen  If needed.  Stop taking Ibuprofen ( Advil, Motrin)  Do not wear jewelry, make-up or nail polish.  Do not wear lotions, powders, or perfumes. You may wear deodorant.  Do not shave 48 hours prior to surgery.  Do not bring valuables to the hospital.  Contacts, dentures or bridgework may not be worn into surgery.  Leave suitcase in the car. After surgery it may be brought to your room.  For patients admitted to the hospital, checkout time is 11:00 AM the day of discharge.   Patients discharged the day of surgery will not be allowed to drive home.  Name and phone number of your driver: --  Special Instructions: CHG Shower Shower 2 days before surgery and 1 day before surgery with Hibiclens.   Please read over the following fact sheets that you were given: Pain Booklet, Coughing and Deep Breathing, MRSA Information and Surgical Site Infection Prevention

## 2012-02-26 MED ORDER — CEFAZOLIN SODIUM-DEXTROSE 2-3 GM-% IV SOLR
2.0000 g | INTRAVENOUS | Status: AC
  Administered 2012-02-27: 2 g via INTRAVENOUS
  Filled 2012-02-26: qty 50

## 2012-02-27 ENCOUNTER — Encounter (HOSPITAL_COMMUNITY)
Admission: RE | Disposition: A | Discharge status: Home / Self Care | Payer: Self-pay | Source: Ambulatory Visit | Attending: Orthopedic Surgery

## 2012-02-27 ENCOUNTER — Ambulatory Visit (HOSPITAL_COMMUNITY): Payer: Medicaid Other

## 2012-02-27 ENCOUNTER — Encounter (HOSPITAL_COMMUNITY): Payer: Self-pay | Admitting: *Deleted

## 2012-02-27 ENCOUNTER — Ambulatory Visit (HOSPITAL_COMMUNITY)
Admission: RE | Admit: 2012-02-27 | Discharge: 2012-02-27 | Disposition: A | Discharge status: Home / Self Care | Payer: Medicaid Other | Source: Ambulatory Visit | Attending: Orthopedic Surgery | Admitting: Orthopedic Surgery

## 2012-02-27 ENCOUNTER — Encounter (HOSPITAL_COMMUNITY): Payer: Self-pay | Admitting: Vascular Surgery

## 2012-02-27 ENCOUNTER — Encounter (HOSPITAL_COMMUNITY): Payer: Self-pay

## 2012-02-27 DIAGNOSIS — M23329 Other meniscus derangements, posterior horn of medial meniscus, unspecified knee: Secondary | ICD-10-CM | POA: Insufficient documentation

## 2012-02-27 DIAGNOSIS — Z01812 Encounter for preprocedural laboratory examination: Secondary | ICD-10-CM | POA: Insufficient documentation

## 2012-02-27 DIAGNOSIS — M2240 Chondromalacia patellae, unspecified knee: Secondary | ICD-10-CM

## 2012-02-27 DIAGNOSIS — S83241A Other tear of medial meniscus, current injury, right knee, initial encounter: Secondary | ICD-10-CM | POA: Diagnosis present

## 2012-02-27 HISTORY — PX: CHONDROPLASTY: SHX5177

## 2012-02-27 HISTORY — DX: Adverse effect of unspecified anesthetic, initial encounter: T41.45XA

## 2012-02-27 LAB — CBC
HCT: 43.6 % (ref 39.0–52.0)
Hemoglobin: 15.4 g/dL (ref 13.0–17.0)
MCH: 30.8 pg (ref 26.0–34.0)
MCV: 87.2 fL (ref 78.0–100.0)
RBC: 5 MIL/uL (ref 4.22–5.81)

## 2012-02-27 SURGERY — ARTHROSCOPY, KNEE, WITH MEDIAL MENISCECTOMY
Anesthesia: General | Site: Knee | Laterality: Right | Wound class: Clean

## 2012-02-27 MED ORDER — LACTATED RINGERS IV SOLN
INTRAVENOUS | Status: DC | PRN
  Administered 2012-02-27: 12:00:00 via INTRAVENOUS

## 2012-02-27 MED ORDER — PROMETHAZINE HCL 25 MG/ML IJ SOLN: 6.2500 mg | INTRAMUSCULAR | Status: DC | PRN

## 2012-02-27 MED ORDER — MEPERIDINE HCL 25 MG/ML IJ SOLN: 6.2500 mg | INTRAMUSCULAR | Status: DC | PRN

## 2012-02-27 MED ORDER — MIDAZOLAM HCL 5 MG/5ML IJ SOLN
INTRAMUSCULAR | Status: DC | PRN
  Administered 2012-02-27: 2 mg via INTRAVENOUS

## 2012-02-27 MED ORDER — OXYCODONE-ACETAMINOPHEN 5-325 MG PO TABS
1.0000 | ORAL_TABLET | Freq: Four times a day (QID) | ORAL | Status: DC | PRN
  Administered 2012-02-27: 2 via ORAL

## 2012-02-27 MED ORDER — PROPOFOL 10 MG/ML IV EMUL
INTRAVENOUS | Status: DC | PRN
  Administered 2012-02-27: 140 ug/kg/min via INTRAVENOUS

## 2012-02-27 MED ORDER — MIDAZOLAM HCL 2 MG/2ML IJ SOLN: 0.5000 mg | Freq: Once | INTRAMUSCULAR | Status: DC | PRN

## 2012-02-27 MED ORDER — ONDANSETRON HCL 4 MG/2ML IJ SOLN
INTRAMUSCULAR | Status: DC | PRN
  Administered 2012-02-27: 4 mg via INTRAVENOUS

## 2012-02-27 MED ORDER — LACTATED RINGERS IV SOLN: INTRAVENOUS | Status: DC

## 2012-02-27 MED ORDER — SODIUM CHLORIDE 0.9 % IR SOLN
Status: DC | PRN
  Administered 2012-02-27: 3000 mL

## 2012-02-27 MED ORDER — KETOROLAC TROMETHAMINE 30 MG/ML IJ SOLN
15.0000 mg | Freq: Once | INTRAMUSCULAR | Status: AC | PRN
  Administered 2012-02-27: 30 mg via INTRAVENOUS

## 2012-02-27 MED ORDER — ACETAMINOPHEN 325 MG PO TABS: 325.0000 mg | ORAL_TABLET | ORAL | Status: DC | PRN

## 2012-02-27 MED ORDER — OXYCODONE-ACETAMINOPHEN 5-325 MG PO TABS: 1.0000 | ORAL_TABLET | Freq: Four times a day (QID) | ORAL | Status: AC | PRN

## 2012-02-27 MED ORDER — OXYCODONE-ACETAMINOPHEN 5-325 MG PO TABS
ORAL_TABLET | ORAL | Status: AC
  Filled 2012-02-27: qty 2

## 2012-02-27 MED ORDER — FENTANYL CITRATE 0.05 MG/ML IJ SOLN
INTRAMUSCULAR | Status: DC | PRN
  Administered 2012-02-27: 100 ug via INTRAVENOUS
  Administered 2012-02-27 (×2): 50 ug via INTRAVENOUS

## 2012-02-27 MED ORDER — FENTANYL CITRATE 0.05 MG/ML IJ SOLN: 25.0000 ug | INTRAMUSCULAR | Status: DC | PRN

## 2012-02-27 MED ORDER — POVIDONE-IODINE 7.5 % EX SOLN: Freq: Once | CUTANEOUS | Status: DC

## 2012-02-27 MED ORDER — BUPIVACAINE-EPINEPHRINE PF 0.25-1:200000 % IJ SOLN
INTRAMUSCULAR | Status: DC | PRN
  Administered 2012-02-27: 20 mL

## 2012-02-27 MED ORDER — LIDOCAINE HCL (CARDIAC) 20 MG/ML IV SOLN
INTRAVENOUS | Status: DC | PRN
  Administered 2012-02-27: 60 mg via INTRAVENOUS

## 2012-02-27 MED ORDER — PROPOFOL 10 MG/ML IV EMUL
INTRAVENOUS | Status: DC | PRN
  Administered 2012-02-27: 200 mg via INTRAVENOUS

## 2012-02-27 MED ORDER — EPINEPHRINE HCL 1 MG/ML IJ SOLN
INTRAMUSCULAR | Status: DC | PRN
  Administered 2012-02-27: 1 mg

## 2012-02-27 SURGICAL SUPPLY — 47 items
BANDAGE ELASTIC 6 VELCRO ST LF (GAUZE/BANDAGES/DRESSINGS) ×3 IMPLANT
BANDAGE GAUZE ELAST BULKY 4 IN (GAUZE/BANDAGES/DRESSINGS) ×1 IMPLANT
BLADE CUDA 5.5 (BLADE) IMPLANT
BLADE CUTTER GATOR 3.5 (BLADE) IMPLANT
BLADE GREAT WHITE 4.2 (BLADE) ×3 IMPLANT
CLOTH BEACON ORANGE TIMEOUT ST (SAFETY) ×3 IMPLANT
CUFF TOURNIQUET SINGLE 34IN LL (TOURNIQUET CUFF) IMPLANT
CUFF TOURNIQUET SINGLE 44IN (TOURNIQUET CUFF) IMPLANT
DRAPE ARTHROSCOPY W/POUCH 114 (DRAPES) ×3 IMPLANT
DRAPE PROXIMA HALF (DRAPES) ×3 IMPLANT
DRAPE U-SHAPE 47X51 STRL (DRAPES) ×3 IMPLANT
DRSG EMULSION OIL 3X3 NADH (GAUZE/BANDAGES/DRESSINGS) ×3 IMPLANT
DRSG PAD ABDOMINAL 8X10 ST (GAUZE/BANDAGES/DRESSINGS) ×1 IMPLANT
DURAPREP 26ML APPLICATOR (WOUND CARE) ×3 IMPLANT
FILTER STRAW FLUID ASPIR (MISCELLANEOUS) ×1 IMPLANT
GAUZE SPONGE 4X4 16PLY XRAY LF (GAUZE/BANDAGES/DRESSINGS) ×3 IMPLANT
GLOVE BIOGEL PI IND STRL 6.5 (GLOVE) ×4 IMPLANT
GLOVE BIOGEL PI IND STRL 8 (GLOVE) ×4 IMPLANT
GLOVE BIOGEL PI INDICATOR 6.5 (GLOVE) ×2
GLOVE BIOGEL PI INDICATOR 8 (GLOVE) ×2
GLOVE ECLIPSE 7.5 STRL STRAW (GLOVE) ×6 IMPLANT
GLOVE SURG SS PI 6.5 STRL IVOR (GLOVE) ×4 IMPLANT
GOWN PREVENTION PLUS XLARGE (GOWN DISPOSABLE) ×3 IMPLANT
GOWN SRG XL XLNG 56XLVL 4 (GOWN DISPOSABLE) ×2 IMPLANT
GOWN STRL NON-REIN LRG LVL3 (GOWN DISPOSABLE) ×3 IMPLANT
GOWN STRL NON-REIN XL XLG LVL4 (GOWN DISPOSABLE) ×3
KIT BASIN OR (CUSTOM PROCEDURE TRAY) ×3 IMPLANT
KIT ROOM TURNOVER OR (KITS) ×3 IMPLANT
NDL 18GX1X1/2 (RX/OR ONLY) (NEEDLE) ×1 IMPLANT
NDL FILTER BLUNT 18X1 1/2 (NEEDLE) IMPLANT
NEEDLE 18GX1X1/2 (RX/OR ONLY) (NEEDLE) ×3 IMPLANT
NEEDLE FILTER BLUNT 18X 1/2SAF (NEEDLE) ×1
NEEDLE FILTER BLUNT 18X1 1/2 (NEEDLE) ×2 IMPLANT
NEEDLE HYPO 25GX1X1/2 BEV (NEEDLE) ×3 IMPLANT
PACK ARTHROSCOPY DSU (CUSTOM PROCEDURE TRAY) ×3 IMPLANT
PAD ARMBOARD 7.5X6 YLW CONV (MISCELLANEOUS) ×6 IMPLANT
PAD CAST 4YDX4 CTTN HI CHSV (CAST SUPPLIES) ×3 IMPLANT
PADDING CAST COTTON 4X4 STRL (CAST SUPPLIES) ×3
SET ARTHROSCOPY TUBING (MISCELLANEOUS) ×3
SET ARTHROSCOPY TUBING LN (MISCELLANEOUS) ×2 IMPLANT
SPONGE GAUZE 4X4 12PLY (GAUZE/BANDAGES/DRESSINGS) ×3 IMPLANT
SUT ETHILON 4 0 PS 2 18 (SUTURE) ×3 IMPLANT
SYR 5ML LL (SYRINGE) ×1 IMPLANT
TOWEL OR 17X24 6PK STRL BLUE (TOWEL DISPOSABLE) ×3 IMPLANT
TOWEL OR 17X26 10 PK STRL BLUE (TOWEL DISPOSABLE) ×3 IMPLANT
TUBE CONNECTING 12X1/4 (SUCTIONS) ×2 IMPLANT
WRAP KNEE MAXI GEL POST OP (GAUZE/BANDAGES/DRESSINGS) ×3 IMPLANT

## 2012-02-27 NOTE — H&P (Signed)
PREOPERATIVE H&P  Chief Complaint: r. Knee pain  HPI: [name] is a 41 y.o. male who presents for evaluation of r. knee. It has been present for 6 mon and has been worsening. He has failed conservative measures. Pain is rated as moderate.  Past Medical History  Diagnosis Date  . Rectal bleeding     polyp removed 01/19/2012 pre canerous  . Complication of anesthesia 1996    ran a fever after sinus surgery   Past Surgical History  Procedure Date  . Nasal turbinate reduction 1996  . Lasik 2006    bilateral  . Colonoscopy    History   Social History  . Marital Status: Married    Spouse Name: N/A    Number of Children: N/A  . 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 Smoking status: Never Smoker   . Smokeless tobacco: Never Used  . Alcohol Use: Yes     occasional beer/wine  . Drug Use: No  . Sexually Active: None   Other Topics Concern  . None   Social History Narrative  . None   Family History  Problem Relation Age of Onset  . Multiple sclerosis Mother   . Anesthesia problems Mother   . Asthma      children; seasonal  . Colon cancer Neg Hx   . Colitis Father     questionable  . Arthritis Son     juvinile rheumatoid   No Known Allergies Prior to Admission medications   Medication Sig Start Date End Date Taking? Authorizing Provider  ibuprofen (ADVIL,MOTRIN) 800 MG tablet Take 800 mg by mouth every 8 (eight) hours as needed. For pain   Yes Historical Provider, MD  Multiple Vitamins-Minerals (MULTIVITAMIN WITH MINERALS) tablet Take 1 tablet by mouth daily as needed.   Yes Historical Provider, MD     Positive ROS: none  All other systems have been reviewed and were otherwise negative with the exception of those mentioned in the HPI and as above.  Physical Exam: Filed Vitals:   02/27/12 1037  BP: 119/83  Pulse: 65  Temp: 98 F (36.7 C)  Resp: 18    General: Alert, no  acute distress Cardiovascular: No pedal edema Respiratory: No cyanosis, no use of accessory musculature GI: No organomegaly, abdomen is soft and non-tender Skin: No lesions in the area of chief complaint Neurologic: Sensation intact distally Psychiatric: Patient is competent for consent with normal mood and affect Lymphatic: No axillary or cervical lymphadenopathy  MUSCULOSKELETAL: r knee: med pain + mcmurray - instability  Assessment/Plan: EFFUSION RIGHT KNEE w/ mmt by mri Plan for Procedure(s): ARTHROSCOPY KNEEright  The risks benefits and alternatives were discussed with the patient including but not limited to the risks of nonoperative treatment, versus surgical intervention including infection, bleeding, nerve injury, malunion, nonunion, hardware prominence, hardware failure, need for hardware removal, blood clots, cardiopulmonary complications, morbidity, mortality, among others, and they were willing to proceed.  Predicted outcome is good, although there will be at least a six to nine month expected recovery.  Harvie Junior, MD 02/27/2012 12:43 PM

## 2012-02-27 NOTE — Progress Notes (Signed)
Orthopedic Tech Progress Note Patient Details:  [name] 1971-03-14 [id number]  Other Ortho Devices Type of Ortho Device: Crutches Ortho Device Interventions: Application   Lashawn Bromwell T 02/27/2012, 2:43 PM

## 2012-02-27 NOTE — Anesthesia Postprocedure Evaluation (Signed)
Anesthesia Post Note  Patient: [name]  Procedure(s) Performed: Procedure(s) (LRB): KNEE ARTHROSCOPY WITH MEDIAL MENISECTOMY (Right) CHONDROPLASTY (Right)  Anesthesia type: general  Patient location: PACU  Post pain: Pain level controlled  Post assessment: Patient's Cardiovascular Status Stable  Last Vitals:  Filed Vitals:   02/27/12 1415  BP: 123/70  Pulse: 57  Temp:   Resp: 11    Post vital signs: Reviewed and stable  Level of consciousness: sedated  Complications: No apparent anesthesia complications

## 2012-02-27 NOTE — Brief Op Note (Addendum)
02/27/2012  1:47 PM  PATIENT:  Letha Cape  41 y.o. male  PRE-OPERATIVE DIAGNOSIS:Medial meniscus tear right knee  POST-OPERATIVE DIAGNOSIS:  medial meniscus tear; chondromalacia patella-femoral joint right knee  PROCEDURE:  Procedure(s) (LRB): KNEE ARTHROSCOPY WITH MEDIAL MENISECTOMY (Right) CHONDROPLASTY (Right)  SURGEON:  Surgeon(s) and Role:    * Harvie Junior, MD - Primary  PHYSICIAN ASSISTANT: [place] PA-C    ANESTHESIA:   general  EBL:  Total I/O In: 850 [I.V.:850] Out: -   LOCAL MEDICATIONS USED: Marcaine with epi  DICTATION: .Other Dictation: Dictation Number   PLAN OF CARE: Discharge to home after PACU  PATIENT DISPOSITION:  PACU - hemodynamically stable.    Delay start of Pharmacological VTE agent (>24hrs) due to surgical blood loss or risk of bleeding: not applicable   Dictation number  [id number]

## 2012-02-27 NOTE — Discharge Instructions (Signed)
POST-OP KNEE ARTHROSCOPY INSTRUCTIONS  °Dr. John Graves/Jim Breionna Punt PA-C ° °Pain °You will be expected to have a moderate amount of pain in the affected knee for approximately two weeks. However, the first two days will be the most severe pain. A prescription has been provided to take as needed for the pain. The pain can be reduced by applying ice packs to the knee for the first 1-2 weeks post surgery. Also, keeping the leg elevated on pillows will help alleviate the pain. If you develop any acute pain or swelling in your calf muscle, please call the doctor. ° °Activity °It is preferred that you stay at bed rest for approximately 24 hours. However, you may go to the bathroom with help. Weight bearing as tolerated. You may begin the knee exercises the day of surgery. Discontinue crutches as the knee pain resolves. ° °Dressing °Keep the dressing dry. If the ace bandage should wrinkle or roll up, this can be rewrapped to prevent ridges in the bandage. You may remove all dressings in 48 hours,  apply bandaids to each wound. You may shower on the 4th day after surgery but no tub bath. ° °Symptoms to report to your doctor °Extreme pain °Extreme swelling °Temperature above 101 degrees °Change in the feeling, color, or movement of your toes °Redness, heat, or swelling at your incision ° °Exercise °If is preferred that as soon as possible you try to do a straight leg raise without bending the knee and concentrate on bringing the heel of your foot off the bed up to approximately 45 degrees and hold for the count of 10 seconds. Repeat this at least 10 times three or four times per day. Additional exercises are provided below. ° °You are encouraged to bend the knee as tolerated. ° °Follow-Up °Call to schedule a follow-up appointment in 5-7 days. Our office # is [phone]. ° °POST-OP EXERCISES ° °Short Arc Quads ° °1. Lie on back with legs straight. Place towel roll under thigh, just above knee. °2. Tighten thigh muscles to  straighten knee and lift heel off bed. °3. Hold for slow count of five, then lower. °4. Do three sets of ten ° ° ° °Straight Leg Raises ° °1. Lie on back with operative leg straight and other leg bent. °2. Keeping operative leg completely straight, slowly lift operative leg so foot is 5 inches off bed. °3. Hold for slow count of five, then lower. °4. Do three sets of ten. ° ° ° °DO BOTH EXERCISES 2 TIMES A DAY ° °Ankle Pumps ° °Work/move the operative ankle and foot up and down 10 times every hour while awake. °

## 2012-02-27 NOTE — Anesthesia Procedure Notes (Signed)
Procedure Name: LMA Insertion Date/Time: 02/27/2012 1:03 PM Performed by: Carmela Rima Pre-anesthesia Checklist: Emergency Drugs available, Patient identified, Timeout performed, Suction available and Patient being monitored Patient Re-evaluated:Patient Re-evaluated prior to inductionOxygen Delivery Method: Circle system utilized Preoxygenation: Pre-oxygenation with 100% oxygen Intubation Type: IV induction Ventilation: Mask ventilation without difficulty LMA: LMA inserted LMA Size: 5.0 Number of attempts: 1 Placement Confirmation: positive ETCO2 Tube secured with: Tape Dental Injury: Teeth and Oropharynx as per pre-operative assessment

## 2012-02-27 NOTE — Progress Notes (Signed)
Dr. Sheral Apley notified that patient reports he ran a fever after his sinus surgery in 1996.

## 2012-02-27 NOTE — Preoperative (Signed)
Beta Blockers   Reason not to administer Beta Blockers:Not Applicable 

## 2012-02-27 NOTE — Anesthesia Preprocedure Evaluation (Signed)
Anesthesia Evaluation  Patient identified by MRN, date of birth, ID band Patient awake    Reviewed: Allergy & Precautions, H&P , Patient's Chart, lab work & pertinent test results, reviewed documented beta blocker date and time   Airway Mallampati: III TM Distance: >3 FB Neck ROM: full    Dental No notable dental hx.    Pulmonary neg pulmonary ROS,  breath sounds clear to auscultation  Pulmonary exam normal       Cardiovascular Exercise Tolerance: Good negative cardio ROS  Rhythm:regular Rate:Normal     Neuro/Psych negative neurological ROS  negative psych ROS   GI/Hepatic negative GI ROS, Neg liver ROS,   Endo/Other  negative endocrine ROS  Renal/GU negative Renal ROS     Musculoskeletal   Abdominal   Peds  Hematology negative hematology ROS (+)   Anesthesia Other Findings   Reproductive/Obstetrics negative OB ROS                           Anesthesia Physical Anesthesia Plan  ASA: II  Anesthesia Plan: General LMA   Post-op Pain Management:    Induction:   Airway Management Planned:   Additional Equipment:   Intra-op Plan:   Post-operative Plan:   Informed Consent: I have reviewed the patients History and Physical, chart, labs and discussed the procedure including the risks, benefits and alternatives for the proposed anesthesia with the patient or authorized representative who has indicated his/her understanding and acceptance.   Dental Advisory Given  Plan Discussed with: CRNA, Surgeon and Anesthesiologist  Anesthesia Plan Comments:         Anesthesia Quick Evaluation

## 2012-02-27 NOTE — Transfer of Care (Signed)
Immediate Anesthesia Transfer of Care Note  Patient: [name]  Procedure(s) Performed: Procedure(s) (LRB): KNEE ARTHROSCOPY WITH MEDIAL MENISECTOMY (Right) CHONDROPLASTY (Right)  Patient Location: PACU  Anesthesia Type: General  Level of Consciousness: awake, alert  and oriented  Airway & Oxygen Therapy: Patient Spontanous Breathing and Patient connected to nasal cannula oxygen  Post-op Assessment: Report given to PACU RN, Post -op Vital signs reviewed and stable and Patient moving all extremities  Post vital signs: Reviewed and stable  Complications: No apparent anesthesia complications

## 2012-02-28 ENCOUNTER — Encounter (HOSPITAL_COMMUNITY): Payer: Self-pay | Admitting: Orthopedic Surgery

## 2012-02-28 NOTE — Op Note (Signed)
NAMESAYAN, ALDAVA            ACCOUNT NO.:  [id number]  MEDICAL RECORD NO.:  [id number]  LOCATION:  [facility]                         FACILITY:  [facility]  PHYSICIAN:  Harvie Junior, M.D.   DATE OF BIRTH:  [birth year]  DATE OF PROCEDURE:  02/27/2012 DATE OF DISCHARGE:  02/27/2012                              OPERATIVE REPORT   PREOPERATIVE DIAGNOSIS:  Medial meniscal tear.  POSTOPERATIVE DIAGNOSES: 1. Medial meniscal tear. 2. Chondromalacia of patellofemoral joint.  PROCEDURES: 1. Partial posterior horn medial meniscectomy. 2. Chondroplasty of patellofemoral joint.  SURGEON:  Harvie Junior, M.D.  ASSISTANT:  Marshia Ly, PA  ANESTHESIA:  General.  BRIEF HISTORY:  Mr. Heffern is a 41 year old male with a history of having significant left knee pain.  He had been treated conservatively for a period time.  MRI was obtained about a year ago, which showed he had a medial meniscal tear.  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  Old MRI showed same and because of failure of all conservative care, he was also taken to the operating room for operative knee arthroscopy.  PROCEDURE:  The patient was brought to the operating room.  After adequate anesthesia was obtained with general anesthetic, the patient was placed supine on the operating table.  The right leg was then prepped and draped in usual sterile fashion.  Following this, routine arthroscopic examination of the knee revealed there was an obvious posterior horn medial meniscal tear with flaps anterior and posterior. Once these flaps were identified, the straight biting forceps, upbiting forceps, and right-hand side biting forceps were used to contour down the meniscal rim.  Once this was done, the attention was turned to the ACL,  which was normal.  Lateral side normal.  Attention was turned back to the medial side where final touch of work was done with a shaver and following this, attention was turned up to the patellofemoral joint.  He did unfortunately right in the patellofemoral trochlea have a deep chondral groove.  This was debrided with a suction shaver back to a smooth and stable rim.  Once this was done, the knee was copiously and thoroughly lavaged with 6 L of normal saline irrigation and suctioned dry.  All sterile portals were closed with bandage.  Sterile compressive dressing was applied and 20 mL of 0.25% Marcaine with 1:100,000 epinephrine was instilled into the knee for postoperative anesthesia.  The patient was taken to the recovery room where he was noted to be in satisfactory condition.  Estimated loss for this procedure was none.     Harvie Junior, M.D.     Ranae Plumber  D:  02/27/2012  T:  02/28/2012  Job:  [id number]

## 2012-08-23 ENCOUNTER — Telehealth: Payer: Self-pay | Admitting: Internal Medicine

## 2012-08-23 NOTE — Telephone Encounter (Signed)
Patient with continued rectal bleeding and he wants to be seen before his insurance runs out at the end of the month.  He will come see Mike Gip PA on 08/28/12 10:00

## 2012-08-27 ENCOUNTER — Ambulatory Visit: Payer: Medicaid Other | Admitting: Physician Assistant

## 2012-10-15 ENCOUNTER — Ambulatory Visit (INDEPENDENT_AMBULATORY_CARE_PROVIDER_SITE_OTHER): Payer: Self-pay | Admitting: Internal Medicine

## 2012-10-15 ENCOUNTER — Encounter: Payer: Self-pay | Admitting: Internal Medicine

## 2012-10-15 VITALS — BP 122/80 | HR 76 | Temp 98.6°F | Wt 294.0 lb

## 2012-10-15 DIAGNOSIS — IMO0002 Reserved for concepts with insufficient information to code with codable children: Secondary | ICD-10-CM

## 2012-10-15 MED ORDER — CEPHALEXIN 500 MG PO CAPS: 500.0000 mg | ORAL_CAPSULE | Freq: Three times a day (TID) | ORAL | Status: DC

## 2012-10-15 NOTE — Assessment & Plan Note (Signed)
Due to ingrowing Will treat with keflex Distal ingrowing nail resected today

## 2012-10-15 NOTE — Progress Notes (Signed)
  Subjective:    Patient ID: [name], male    DOB: December 20, 1970, 41 y.o.   MRN: [id number]  HPI Has had ingrown nail in left great toe for some time Diagnosed with fungus by Dr Lacie Scotts Given antifungal but didn't do any good so he stopped  Swelling bad in the past month Some discharge along lateral side Some redness Even hurts to have covers on his toe  No current outpatient prescriptions on file prior to visit.    No Known Allergies  Past Medical History  Diagnosis Date  . Rectal bleeding     polyp removed 01/19/2012 pre canerous  . Complication of anesthesia 1996    ran a fever after sinus surgery    Past Surgical History  Procedure Date  . Nasal turbinate reduction 1996  . Lasik 2006    bilateral  . Colonoscopy   . Chondroplasty 02/27/2012    Procedure: CHONDROPLASTY;  Surgeon: Harvie Junior, MD;  Location: [facility];  Service: Orthopedics;  Laterality: Right;  patella-femoral joint    Family History  Problem Relation Age of Onset  . Multiple sclerosis Mother   . Anesthesia problems Mother   . Asthma      children; seasonal  . Colon cancer Neg Hx   . Colitis Father     questionable  . Arthritis Son     juvinile rheumatoid    History   Social History  . Marital Status: Married    Spouse Name: N/A    Number of Children: N/A  . 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 Smoking status: Never Smoker   . Smokeless tobacco: Never Used  . Alcohol Use: Yes     Comment: occasional beer/wine  . Drug Use: No  . Sexually Active: Not on file   Other Topics Concern  . Not on file   Social History Narrative  . No narrative on file   Review of Systems No fever No nausea Appetite is okay    Objective:   Physical Exam  Constitutional: He appears well-developed and well-nourished. No distress.  Musculoskeletal:       Lateral and distal ingrowing of left great toenail Ethyl  chloride/ distal small wedge resection  Swollen, red and tender          Assessment & Plan:

## 2012-12-07 ENCOUNTER — Ambulatory Visit (INDEPENDENT_AMBULATORY_CARE_PROVIDER_SITE_OTHER): Payer: Self-pay | Admitting: Internal Medicine

## 2012-12-07 ENCOUNTER — Encounter: Payer: Self-pay | Admitting: Internal Medicine

## 2012-12-07 VITALS — BP 130/90 | HR 86 | Temp 99.3°F | Wt 293.0 lb

## 2012-12-07 DIAGNOSIS — J111 Influenza due to unidentified influenza virus with other respiratory manifestations: Secondary | ICD-10-CM

## 2012-12-07 MED ORDER — OSELTAMIVIR PHOSPHATE 75 MG PO CAPS: 75.0000 mg | ORAL_CAPSULE | Freq: Two times a day (BID) | ORAL | Status: DC

## 2012-12-07 NOTE — Progress Notes (Signed)
  Subjective:    Patient ID: Randie Tallarico, male    DOB: 1971-05-18, 41 y.o.   MRN: [id number]  HPI Here with wife and 2 children  Started feeling sick yesterday Sore throat and chest is tight Fever and cough--especially last night Lots of sweats---fever broke with advil Generalized body aches  Tylenol also and mucinex Tried chlorphenirmine also  Slight mucus with cough Some pain in chest with cough Head congestion Feels weak Some ear pain  No current outpatient prescriptions on file prior to visit.    No Known Allergies  Past Medical History  Diagnosis Date  . Rectal bleeding     polyp removed 01/19/2012 pre canerous  . Complication of anesthesia 1996    ran a fever after sinus surgery    Past Surgical History  Procedure Date  . Nasal turbinate reduction 1996  . Lasik 2006    bilateral  . Colonoscopy   . Chondroplasty 02/27/2012    Procedure: CHONDROPLASTY;  Surgeon: Harvie Junior, MD;  Location: [facility];  Service: Orthopedics;  Laterality: Right;  patella-femoral joint    Family History  Problem Relation Age of Onset  . Multiple sclerosis Mother   . Anesthesia problems Mother   . Asthma      children; seasonal  . Colon cancer Neg Hx   . Colitis Father     questionable  . Arthritis Son     juvinile rheumatoid    History   Social History  . Marital Status: Married    Spouse Name: N/A    Number of Children: N/A  . 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 Smoking status: Never Smoker   . Smokeless tobacco: Never Used  . Alcohol Use: Yes     Comment: occasional beer/wine  . Drug Use: No  . Sexually Active: Not on file   Other Topics Concern  . Not on file   Social History Narrative  . No narrative on file   Review of Systems No vomiting or diarrhea  Still able to eat    Objective:   Physical Exam  Constitutional: He appears well-developed and well-nourished.  HENT:        No sinus tenderness  Mild pharyngeal injection Moderate nasal inflammation and swelling  Neck: Normal range of motion. Neck supple.  Pulmonary/Chest: Effort normal and breath sounds normal. No respiratory distress. He has no wheezes. He has no rales.  Lymphadenopathy:    He has no cervical adenopathy.  Skin: He is diaphoretic.          Assessment & Plan:

## 2012-12-07 NOTE — Assessment & Plan Note (Signed)
Classic symptom complex Onset ~24 hours Discussed tamiflu and possibility that it could shorten the duration of the illness

## 2012-12-10 ENCOUNTER — Ambulatory Visit (INDEPENDENT_AMBULATORY_CARE_PROVIDER_SITE_OTHER): Payer: Self-pay | Admitting: Internal Medicine

## 2012-12-10 ENCOUNTER — Encounter: Payer: Self-pay | Admitting: Internal Medicine

## 2012-12-10 VITALS — BP 122/80 | HR 73 | Temp 98.4°F | Resp 12 | Wt 296.0 lb

## 2012-12-10 DIAGNOSIS — J019 Acute sinusitis, unspecified: Secondary | ICD-10-CM | POA: Insufficient documentation

## 2012-12-10 MED ORDER — AMOXICILLIN 500 MG PO TABS: 1000.0000 mg | ORAL_TABLET | Freq: Two times a day (BID) | ORAL | Status: DC

## 2012-12-10 NOTE — Progress Notes (Signed)
  Subjective:    Patient ID: [name], male    DOB: [birth year], 41 y.o.   MRN: [id number]  HPI Here with wife Not feeling better Day 4 of the tamiflu Now productive of thick dark green mucus Feels pain under sternum upon deep breath--radiates to back Some SOB---hard to finish his breath at times  Has had fevers at night Drenching night sweats Stopped the advil yesterday as he was feeling less aching  Throat feels mostly better No ear pain  Current Outpatient Prescriptions on File Prior to Visit  Medication Sig Dispense Refill  . oseltamivir (TAMIFLU) 75 MG capsule Take 1 capsule (75 mg total) by mouth 2 (two) times daily.  10 capsule  0    No Known Allergies  Past Medical History  Diagnosis Date  . Rectal bleeding     polyp removed 01/19/2012 pre canerous  . Complication of anesthesia 1996    ran a fever after sinus surgery    Past Surgical History  Procedure Date  . Nasal turbinate reduction 1996  . Lasik 2006    bilateral  . Colonoscopy   . Chondroplasty 02/27/2012    Procedure: CHONDROPLASTY;  Surgeon: Harvie Junior, MD;  Location: [facility];  Service: Orthopedics;  Laterality: Right;  patella-femoral joint    Family History  Problem Relation Age of Onset  . Multiple sclerosis Mother   . Anesthesia problems Mother   . Asthma      children; seasonal  . Colon cancer Neg Hx   . Colitis Father     questionable  . Arthritis Son     juvinile rheumatoid    History   Social History  . Marital Status: Married    Spouse Name: N/A    Number of Children: N/A  . 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 Smoking status: Never Smoker   . Smokeless tobacco: Never Used  . Alcohol Use: Yes     Comment: occasional beer/wine  . Drug Use: No  . Sexually Active: Not on file   Other Topics Concern  . Not on file   Social History Narrative  . No narrative on file   Review of Systems No  vomiting or diarrhea Appetite is improving     Objective:   Physical Exam  Constitutional: He appears well-developed and well-nourished. No distress.  HENT:  Mouth/Throat: Oropharynx is clear and moist. No oropharyngeal exudate.       No sinus tenderness Moderate nasal congestion and inflammation. Green mucus TMS normal  Neck: Normal range of motion. Neck supple.  Pulmonary/Chest: Effort normal and breath sounds normal. No respiratory distress. He has no wheezes. He has no rales.  Lymphadenopathy:    He has no cervical adenopathy.          Assessment & Plan:

## 2012-12-10 NOTE — Assessment & Plan Note (Signed)
May have secondary bacterial infection now Overall improved Will treat with amoxil

## 2012-12-31 ENCOUNTER — Encounter: Payer: Self-pay | Admitting: Internal Medicine

## 2013-01-04 ENCOUNTER — Encounter: Payer: Self-pay | Admitting: Internal Medicine

## 2013-01-04 ENCOUNTER — Ambulatory Visit (INDEPENDENT_AMBULATORY_CARE_PROVIDER_SITE_OTHER): Payer: Self-pay | Admitting: Internal Medicine

## 2013-01-04 VITALS — BP 112/70 | HR 71 | Temp 97.9°F | Ht 74.0 in | Wt 297.0 lb

## 2013-01-04 DIAGNOSIS — E669 Obesity, unspecified: Secondary | ICD-10-CM | POA: Insufficient documentation

## 2013-01-04 DIAGNOSIS — Z0000 Encounter for general adult medical examination without abnormal findings: Secondary | ICD-10-CM

## 2013-01-04 LAB — CBC WITH DIFFERENTIAL/PLATELET
Basophils Relative: 0.8 % (ref 0.0–3.0)
Eosinophils Relative: 3.1 % (ref 0.0–5.0)
HCT: 45.9 % (ref 39.0–52.0)
Hemoglobin: 15.5 g/dL (ref 13.0–17.0)
Lymphocytes Relative: 38.5 % (ref 12.0–46.0)
Lymphs Abs: 2.6 10*3/uL (ref 0.7–4.0)
Monocytes Relative: 8.4 % (ref 3.0–12.0)
Neutro Abs: 3.4 10*3/uL (ref 1.4–7.7)
RBC: 5.15 Mil/uL (ref 4.22–5.81)
RDW: 13.3 % (ref 11.5–14.6)
WBC: 6.8 10*3/uL (ref 4.5–10.5)

## 2013-01-04 LAB — HEPATIC FUNCTION PANEL
ALT: 83 U/L — ABNORMAL HIGH (ref 0–53)
AST: 39 U/L — ABNORMAL HIGH (ref 0–37)
Albumin: 4.3 g/dL (ref 3.5–5.2)
Alkaline Phosphatase: 51 U/L (ref 39–117)
Bilirubin, Direct: 0 mg/dL (ref 0.0–0.3)
Total Bilirubin: 0.7 mg/dL (ref 0.3–1.2)
Total Protein: 7.5 g/dL (ref 6.0–8.3)

## 2013-01-04 LAB — BASIC METABOLIC PANEL WITH GFR
BUN: 19 mg/dL (ref 6–23)
CO2: 28 meq/L (ref 19–32)
Calcium: 9.3 mg/dL (ref 8.4–10.5)
Chloride: 104 meq/L (ref 96–112)
Creatinine, Ser: 1 mg/dL (ref 0.4–1.5)
GFR: 90.13 mL/min (ref >60.00)
Glucose, Bld: 106 mg/dL — ABNORMAL HIGH (ref 70–99)
Potassium: 4.5 meq/L (ref 3.5–5.1)
Sodium: 138 meq/L (ref 135–145)

## 2013-01-04 LAB — TSH: TSH: 0.92 u[IU]/mL (ref 0.35–5.50)

## 2013-01-04 LAB — LDL CHOLESTEROL, DIRECT: Direct LDL: 143.4 mg/dL

## 2013-01-04 LAB — LIPID PANEL
HDL: 38.3 mg/dL — ABNORMAL LOW (ref >39.00)
Total CHOL/HDL Ratio: 6

## 2013-01-04 NOTE — Progress Notes (Signed)
Subjective:    Patient ID: [name], male    DOB: 11-Feb-1971, 41 y.o.   MRN: [id number]  HPI Here for physical No specific concerns---worried about cholesterol, etc  Has gained 70# since getting married Schedule tough with owning restaurant Knee is better --hopes to start walking again when weather is better May join Y with wife Discussed dietary rigor  No current outpatient prescriptions on file prior to visit.    No Known Allergies  Past Medical History  Diagnosis Date  . Rectal bleeding     polyp removed 01/19/2012 pre canerous  . Complication of anesthesia 1996    ran a fever after sinus surgery    Past Surgical History  Procedure Date  . Nasal turbinate reduction 1996  . Lasik 2006    bilateral  . Colonoscopy   . Chondroplasty 02/27/2012    Procedure: CHONDROPLASTY;  Surgeon: Harvie Junior, MD;  Location: [facility];  Service: Orthopedics;  Laterality: Right;  patella-femoral joint    Family History  Problem Relation Age of Onset  . Multiple sclerosis Mother   . Anesthesia problems Mother   . Asthma      children; seasonal  . Colon cancer Neg Hx   . Colitis Father     questionable  . Arthritis Son     juvinile rheumatoid    History   Social History  . Marital Status: Married    Spouse Name: N/A    Number of Children: N/A  . 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 Smoking status: Never Smoker   . Smokeless tobacco: Never Used  . Alcohol Use: Yes     Comment: occasional beer/wine  . Drug Use: No  . Sexually Active: Not on file   Other Topics Concern  . Not on file   Social History Narrative  . No narrative on file   Review of Systems  Constitutional: Negative for fatigue and unexpected weight change.       Wears seat belt  HENT: Positive for congestion. Negative for hearing loss, rhinorrhea, dental problem and tinnitus.        Regular with dentist Occ  congestion--no regular problems.   Eyes: Negative for visual disturbance.       No diplopia or unilateral vision loss  Respiratory: Negative for cough, chest tightness and shortness of breath.   Cardiovascular: Positive for palpitations. Negative for chest pain and leg swelling.       With stress heart may go fast  Gastrointestinal: Negative for nausea, vomiting, abdominal pain, constipation and blood in stool.       Occ stomach upset if he eats too late  Genitourinary: Negative for frequency and difficulty urinating.       No sexual problems  Musculoskeletal: Negative for back pain, joint swelling and arthralgias.  Skin: Negative for rash.       No suspicious lesions Dry skin in cold weather  Neurological: Negative for dizziness, syncope, weakness, light-headedness, numbness and headaches.  Hematological: Negative for adenopathy. Does not bruise/bleed easily.  Psychiatric/Behavioral: Negative for sleep disturbance and dysphoric mood. The patient is not nervous/anxious.        Stress with work is persistent---feels overwhelmed at times       Objective:   Physical Exam  Constitutional: He is oriented to person, place, and time. He appears well-developed and well-nourished. No distress.  HENT:  Head: Normocephalic and atraumatic.  Right Ear: External ear normal.  Left Ear: External ear normal.  Mouth/Throat: Oropharynx is clear and moist. No oropharyngeal exudate.  Eyes: Conjunctivae normal and EOM are normal. Pupils are equal, round, and reactive to light.  Neck: Normal range of motion. Neck supple. No thyromegaly present.  Cardiovascular: Normal rate, regular rhythm, normal heart sounds and intact distal pulses.  Exam reveals no gallop.   No murmur heard. Pulmonary/Chest: Effort normal and breath sounds normal. No respiratory distress. He has no wheezes. He has no rales.  Abdominal: Soft. There is no tenderness.  Musculoskeletal: He exhibits no edema and no tenderness.    Lymphadenopathy:    He has no cervical adenopathy.  Neurological: He is alert and oriented to person, place, and time.  Skin: No rash noted. No erythema.       Multiple benign nevi  Psychiatric: He has a normal mood and affect. His behavior is normal.       Clear stress           Assessment & Plan:

## 2013-01-04 NOTE — Patient Instructions (Addendum)

## 2013-01-04 NOTE — Assessment & Plan Note (Signed)
Healthy Needs to work on fitness and better eating

## 2013-01-04 NOTE — Assessment & Plan Note (Signed)
Discussed trying to fit in exercise when he can Must improve diet

## 2013-01-07 ENCOUNTER — Encounter: Payer: Self-pay | Admitting: *Deleted

## 2013-01-14 ENCOUNTER — Telehealth: Payer: Self-pay

## 2013-01-14 NOTE — Telephone Encounter (Signed)
Pt is changing insurance to [employer] and wants to discuss some issues about his and wife's condition prior to changing insurance co. I asked pt if he could be more specific about what he needed to know and Pt is in no hurry but wants to speak with Dr Alphonsus Sias.

## 2013-01-14 NOTE — Telephone Encounter (Signed)
He wonders if I could still downcode visits if he has insurance with high deductible I told him I still think those plans have a copayment for primary care visits If not, I can reduce charge to accomodate him

## 2013-07-12 ENCOUNTER — Encounter: Payer: Self-pay | Admitting: Family Medicine

## 2013-07-12 ENCOUNTER — Ambulatory Visit (INDEPENDENT_AMBULATORY_CARE_PROVIDER_SITE_OTHER): Payer: BC Managed Care – PPO | Admitting: Family Medicine

## 2013-07-12 VITALS — BP 110/78 | HR 64 | Temp 98.4°F | Wt 302.5 lb

## 2013-07-12 DIAGNOSIS — R1011 Right upper quadrant pain: Secondary | ICD-10-CM

## 2013-07-12 LAB — CBC WITH DIFFERENTIAL/PLATELET
Eosinophils Absolute: 0.2 10*3/uL (ref 0.0–0.7)
Hemoglobin: 15 g/dL (ref 13.0–17.0)
Lymphocytes Relative: 45 % (ref 12–46)
Lymphs Abs: 3.1 10*3/uL (ref 0.7–4.0)
MCH: 32.2 pg (ref 26.0–34.0)
MCV: 91 fL (ref 78.0–100.0)
Monocytes Relative: 6 % (ref 3–12)
Neutrophils Relative %: 45 % (ref 43–77)
Platelets: 243 10*3/uL (ref 150–400)
RBC: 4.66 MIL/uL (ref 4.22–5.81)
WBC: 6.7 10*3/uL (ref 4.0–10.5)

## 2013-07-12 LAB — COMPREHENSIVE METABOLIC PANEL
AST: 29 U/L (ref 0–37)
Albumin: 4.1 g/dL (ref 3.5–5.2)
Alkaline Phosphatase: 65 U/L (ref 39–117)
BUN: 24 mg/dL — ABNORMAL HIGH (ref 6–23)
Creat: 1.1 mg/dL (ref 0.50–1.35)
Glucose, Bld: 82 mg/dL (ref 70–99)

## 2013-07-12 NOTE — Assessment & Plan Note (Signed)
Nontoxic, minimal pain now.  D/w pt.  He ate about 2 hours ago.  Would not proceed with u/s yet given recent po intake.  Would check labs stat, notify pt.  Nondairy, low fat diet in meantime.  If profoundly worse over the weekend, to ER.  O/w check u/s early next week.  He agrees.  Okay for outpatient f/u.

## 2013-07-12 NOTE — Progress Notes (Signed)
Sx started this AM with sharp pain on RUQ.  Sitting down would help.  Worse on standing.  No pain like this previously.  He did have some abnormal sensation in the RUQ prev, much more mild in the last week.  Last night he had some nausea. He took zofran and melatonin, either or both seemed to help.  Fatigued more in the last week.  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  No fevers.  No vomiting.  No diarrhea.  No blood in stool.  H/o colonoscopy.  No h/o abdominal surgery.    Meds, vitals, and allergies reviewed.   ROS: See HPI.  Otherwise, noncontributory.  nad ncat Mmm rrr ctab abd soft, not ttp except for minimal ttp in the RUQ, murphy's neg No rebound No jaundice. Normal BS Ext w/o edema

## 2013-07-12 NOTE — Patient Instructions (Addendum)
Go to the lab on the way out.  We'll contact you with your lab report.  See Shirlee Limerick about your referral before you leave today. If the pain is much worse, then go to the ER.  Take care.

## 2013-07-15 ENCOUNTER — Encounter: Payer: Self-pay | Admitting: Family Medicine

## 2013-07-15 ENCOUNTER — Telehealth: Payer: Self-pay | Admitting: Family Medicine

## 2013-07-15 ENCOUNTER — Other Ambulatory Visit: Payer: Self-pay | Admitting: Family Medicine

## 2013-07-15 ENCOUNTER — Ambulatory Visit: Payer: Self-pay | Admitting: Family Medicine

## 2013-07-15 DIAGNOSIS — R1011 Right upper quadrant pain: Secondary | ICD-10-CM

## 2013-07-15 NOTE — Telephone Encounter (Signed)
Didn't you talk to the patient about this already?

## 2013-07-15 NOTE — Telephone Encounter (Signed)
[facility] Triage Call Report Triage Record Num: [id number] Operator: Craig Guess Patient Name: [name] Call Date & Time: 07/12/2013 8:25:06PM Patient Phone: PCP: Crawford Givens Patient Gender: Male PCP Fax : Patient DOB: 12-29-1970 Practice Name: [facility] Reason for Call: Caller: Zella Ball; PCP: Crawford Givens Clelia Croft) [facility]); CB#: [phone]; Call regarding Zella Ball is calling from [place] regarding a CMP ordered on [place] by Crawford Givens Clelia Croft) [facility]). Zella Ball calling with results of STAT labs. BUN is high at 24 (normals 6-23) and ALT is elevated at 74 (normals 0-53). No critical results. Results of CBC, lipase and rest of CMP are all within normal limits. Results faxed to office per practice profile. Protocol(s) Used: PCP Calls, No Triage (Adult) Recommended Outcome per Protocol: Call Provider within 24 Hours Reason for Outcome: Lab calling with test results Care Advice: ~ 07/12/2013 8:31:00PM Page 1 of 1 [id number]

## 2013-07-15 NOTE — Telephone Encounter (Signed)
I had reviewed and talked to pt about labs.  See result note.

## 2013-07-16 ENCOUNTER — Other Ambulatory Visit: Payer: Self-pay | Admitting: Family Medicine

## 2013-07-16 DIAGNOSIS — R1011 Right upper quadrant pain: Secondary | ICD-10-CM

## 2013-07-18 ENCOUNTER — Ambulatory Visit: Payer: Self-pay | Admitting: Family Medicine

## 2013-07-18 ENCOUNTER — Encounter: Payer: Self-pay | Admitting: Family Medicine

## 2013-07-18 ENCOUNTER — Other Ambulatory Visit: Payer: Self-pay | Admitting: Family Medicine

## 2013-07-18 DIAGNOSIS — R1011 Right upper quadrant pain: Secondary | ICD-10-CM

## 2013-07-19 ENCOUNTER — Encounter: Payer: Self-pay | Admitting: Family Medicine

## 2013-07-21 ENCOUNTER — Telehealth (INDEPENDENT_AMBULATORY_CARE_PROVIDER_SITE_OTHER): Payer: Self-pay | Admitting: General Surgery

## 2013-07-21 NOTE — Telephone Encounter (Signed)
His wife called.  He has biliary dyskinesia and is in pain in the RUQ after eating chicken salad.  He was instructed, by his PCP to get an appointment with [place].  He does not have cholelithiasis.  They were told to go to the ED if his situation got worse.  I discussed the nature of his disease with her.  I suggested he try and take Advil first before going to the ED and if that did not work, they could then go to the ED.  I also recommended a strict lowfat diet.  He was wondering if there was any way he could avoid an operation by changing his diet and losing weight.  I explained to him that in my experience with biliary dyskinesia, once it becomes symptomatic, it rarely become asymptomatic.  I advised them to call the office tomorrow morning and arrangement to be seen next week.

## 2013-07-23 ENCOUNTER — Ambulatory Visit (INDEPENDENT_AMBULATORY_CARE_PROVIDER_SITE_OTHER): Payer: BC Managed Care – PPO | Admitting: General Surgery

## 2013-07-23 ENCOUNTER — Encounter (INDEPENDENT_AMBULATORY_CARE_PROVIDER_SITE_OTHER): Payer: Self-pay | Admitting: General Surgery

## 2013-07-23 VITALS — BP 138/72 | HR 68 | Temp 97.7°F | Resp 14 | Ht 74.0 in | Wt 295.8 lb

## 2013-07-23 DIAGNOSIS — K828 Other specified diseases of gallbladder: Secondary | ICD-10-CM

## 2013-07-23 NOTE — Progress Notes (Signed)
Subjective:     Patient ID: [name], male   DOB: [birth year], 42 y.o.   MRN: [id number]  HPI The patient is a 42 year old male who is referred by Dr.   Para March for an evaluation of biliary dyskinesia. Patient states that for approximately to 3 months he's had some previous right upper quadrant pain as well as pain radiating to her shoulder. Patient undergo ultrasound which revealed no stones but is also HIDA scan which revealed an ejection fraction of 19%.  The patient states he had several life status changes recently which include increased stress as well as having a more fast food take out for meals. He feels that his right upper quadrant pain has fallen in line with these changes.  Review of Systems  Constitutional: Negative.   HENT: Negative.   Respiratory: Negative.   Cardiovascular: Negative.   Gastrointestinal: Negative.   Neurological: Negative.   All other systems reviewed and are negative.       Objective:   Physical Exam  Constitutional: He is oriented to person, place, and time. He appears well-developed and well-nourished.  HENT:  Head: Normocephalic and atraumatic.  Eyes: Conjunctivae and EOM are normal. Pupils are equal, round, and reactive to light.  Neck: Normal range of motion. Neck supple.  Cardiovascular: Normal rate, regular rhythm and normal heart sounds.   Pulmonary/Chest: Effort normal and breath sounds normal.  Abdominal: Bowel sounds are normal. There is tenderness (min ttp).  Musculoskeletal: Normal range of motion.  Neurological: He is alert and oriented to person, place, and time.       Assessment:     42 year old male with likely biliary dyskinesia with an ejection fraction of 19%.     Plan:     1. The patient and I had a long discussion in regards to nonoperative treatment at this time which would include doing a low fat diet and eating as much holding carbs as possible. The patient works as a Investment banker, operational and has a high interest in the content of  food.  2.the patient and follow back up as needed if he continues with occasional right upper quadrant pain even with diet changes. At that time we talked about removing his gallbladder

## 2013-07-29 ENCOUNTER — Ambulatory Visit (INDEPENDENT_AMBULATORY_CARE_PROVIDER_SITE_OTHER): Payer: BC Managed Care – PPO | Admitting: General Surgery

## 2013-10-08 ENCOUNTER — Encounter: Payer: Self-pay | Admitting: Internal Medicine

## 2014-03-05 ENCOUNTER — Ambulatory Visit (INDEPENDENT_AMBULATORY_CARE_PROVIDER_SITE_OTHER): Payer: BC Managed Care – PPO | Admitting: Internal Medicine

## 2014-03-05 ENCOUNTER — Encounter: Payer: Self-pay | Admitting: Internal Medicine

## 2014-03-05 VITALS — BP 120/80 | HR 85 | Temp 98.5°F | Wt 304.0 lb

## 2014-03-05 DIAGNOSIS — Z733 Stress, not elsewhere classified: Secondary | ICD-10-CM

## 2014-03-05 DIAGNOSIS — F439 Reaction to severe stress, unspecified: Secondary | ICD-10-CM

## 2014-03-05 NOTE — Progress Notes (Signed)
Pre visit review using our clinic review tool, if applicable. No additional management support is needed unless otherwise documented below in the visit note. 
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
## 2014-03-05 NOTE — Assessment & Plan Note (Signed)
Wife wants him on meds but I don't think he has explosive affective disorder requiring meds He gets angry when stressed (mostly marital---doesn't get out of control with work stress)---but knows to walk away Will hold off on meds for now They need to go for marital counseling-made referral for wife  Spent 35-40 minutes Mostly counseling

## 2014-03-18 ENCOUNTER — Encounter: Payer: Self-pay | Admitting: Internal Medicine

## 2014-03-18 MED ORDER — HYDROCORTISONE 2.5 % EX CREA: TOPICAL_CREAM | Freq: Three times a day (TID) | CUTANEOUS | Status: DC | PRN

## 2014-04-16 ENCOUNTER — Encounter: Payer: Self-pay | Admitting: Internal Medicine

## 2014-04-16 ENCOUNTER — Ambulatory Visit (INDEPENDENT_AMBULATORY_CARE_PROVIDER_SITE_OTHER): Payer: BC Managed Care – PPO | Admitting: Internal Medicine

## 2014-04-16 VITALS — BP 110/80 | HR 69 | Temp 98.3°F | Ht 74.0 in | Wt 304.0 lb

## 2014-04-16 DIAGNOSIS — F439 Reaction to severe stress, unspecified: Secondary | ICD-10-CM

## 2014-04-16 DIAGNOSIS — Z0000 Encounter for general adult medical examination without abnormal findings: Secondary | ICD-10-CM

## 2014-04-16 DIAGNOSIS — E785 Hyperlipidemia, unspecified: Secondary | ICD-10-CM

## 2014-04-16 DIAGNOSIS — I872 Venous insufficiency (chronic) (peripheral): Secondary | ICD-10-CM

## 2014-04-16 DIAGNOSIS — Z733 Stress, not elsewhere classified: Secondary | ICD-10-CM

## 2014-04-16 LAB — COMPREHENSIVE METABOLIC PANEL
ALK PHOS: 55 U/L (ref 39–117)
ALT: 80 U/L — ABNORMAL HIGH (ref 0–53)
AST: 38 U/L — ABNORMAL HIGH (ref 0–37)
Albumin: 4.3 g/dL (ref 3.5–5.2)
BILIRUBIN TOTAL: 0.4 mg/dL (ref 0.2–1.2)
BUN: 19 mg/dL (ref 6–23)
CO2: 28 mEq/L (ref 19–32)
CREATININE: 1.1 mg/dL (ref 0.4–1.5)
Calcium: 9.6 mg/dL (ref 8.4–10.5)
Chloride: 104 mEq/L (ref 96–112)
GFR: 77.56 mL/min (ref >60.00)
GLUCOSE: 92 mg/dL (ref 70–99)
Potassium: 4.3 mEq/L (ref 3.5–5.1)
SODIUM: 140 meq/L (ref 135–145)
Total Protein: 7.3 g/dL (ref 6.0–8.3)

## 2014-04-16 LAB — CBC WITH DIFFERENTIAL/PLATELET
BASOS PCT: 0.6 % (ref 0.0–3.0)
Basophils Absolute: 0 10*3/uL (ref 0.0–0.1)
EOS PCT: 2.9 % (ref 0.0–5.0)
Eosinophils Absolute: 0.2 10*3/uL (ref 0.0–0.7)
HEMATOCRIT: 42.8 % (ref 39.0–52.0)
HEMOGLOBIN: 15.3 g/dL (ref 13.0–17.0)
LYMPHS ABS: 2.2 10*3/uL (ref 0.7–4.0)
Lymphocytes Relative: 34.5 % (ref 12.0–46.0)
MCHC: 35.7 g/dL (ref 30.0–36.0)
MCV: 93.7 fl (ref 78.0–100.0)
MONO ABS: 0.5 10*3/uL (ref 0.1–1.0)
Monocytes Relative: 8 % (ref 3.0–12.0)
Neutro Abs: 3.4 10*3/uL (ref 1.4–7.7)
Neutrophils Relative %: 54 % (ref 43.0–77.0)
Platelets: 258 10*3/uL (ref 150.0–400.0)
RBC: 4.57 Mil/uL (ref 4.22–5.81)
RDW: 12.9 % (ref 11.5–15.5)
WBC: 6.3 10*3/uL (ref 4.0–10.5)

## 2014-04-16 LAB — LIPID PANEL
CHOL/HDL RATIO: 6
Cholesterol: 205 mg/dL — ABNORMAL HIGH (ref 0–200)
HDL: 37.1 mg/dL — ABNORMAL LOW (ref >39.00)
LDL CALC: 132 mg/dL — AB (ref 0–99)
Triglycerides: 178 mg/dL — ABNORMAL HIGH (ref 0.0–149.0)
VLDL: 35.6 mg/dL (ref 0.0–40.0)

## 2014-04-16 LAB — T4, FREE: FREE T4: 0.75 ng/dL (ref 0.60–1.60)

## 2014-04-16 LAB — TSH: TSH: 1.51 u[IU]/mL (ref 0.35–4.50)

## 2014-04-16 NOTE — Assessment & Plan Note (Signed)
Healthy but lots of stress and unhealthy lifestyle---discussed

## 2014-04-16 NOTE — Patient Instructions (Signed)
Exercise to Lose Weight Exercise and a healthy diet may help you lose weight. Your doctor may suggest specific exercises. EXERCISE IDEAS AND TIPS  Choose low-cost things you enjoy doing, such as walking, bicycling, or exercising to workout videos.  Take stairs instead of the elevator.  Walk during your lunch break.  Park your car further away from work or school.  Go to a gym or an exercise class.  Start with 5 to 10 minutes of exercise each day. Build up to 30 minutes of exercise 4 to 6 days a week.  Wear shoes with good support and comfortable clothes.  Stretch before and after working out.  Work out until you breathe harder and your heart beats faster.  Drink extra water when you exercise.  Do not do so much that you hurt yourself, feel dizzy, or get very short of breath. Exercises that burn about 150 calories:  Running 1  miles in 15 minutes.  Playing volleyball for 45 to 60 minutes.  Washing and waxing a car for 45 to 60 minutes.  Playing touch football for 45 minutes.  Walking 1  miles in 35 minutes.  Pushing a stroller 1  miles in 30 minutes.  Playing basketball for 30 minutes.  Raking leaves for 30 minutes.  Bicycling 5 miles in 30 minutes.  Walking 2 miles in 30 minutes.  Dancing for 30 minutes.  Shoveling snow for 15 minutes.  Swimming laps for 20 minutes.  Walking up stairs for 15 minutes.  Bicycling 4 miles in 15 minutes.  Gardening for 30 to 45 minutes.  Jumping rope for 15 minutes.  Washing windows or floors for 45 to 60 minutes. Document Released: 12/31/2010 Document Revised: 02/20/2012 Document Reviewed: 12/31/2010 [facility] Patient Information 2014 [place], [place]. DASH Diet The DASH diet stands for "Dietary Approaches to Stop Hypertension." It is a healthy eating plan that has been shown to reduce high blood pressure (hypertension) in as little as 14 days, while also possibly providing other significant health benefits. These other  health benefits include reducing the risk of breast cancer after menopause and reducing the risk of type 2 diabetes, heart disease, colon cancer, and stroke. Health benefits also include weight loss and slowing kidney failure in patients with chronic kidney disease.  DIET GUIDELINES  Limit salt (sodium). Your diet should contain less than 1500 mg of sodium daily.  Limit refined or processed carbohydrates. Your diet should include mostly whole grains. Desserts and added sugars should be used sparingly.  Include small amounts of heart-healthy fats. These types of fats include nuts, oils, and tub margarine. Limit saturated and trans fats. These fats have been shown to be harmful in the body. CHOOSING FOODS  The following food groups are based on a 2000 calorie diet. See your Registered Dietitian for individual calorie needs. Grains and Grain Products (6 to 8 servings daily)  Eat More Often: Whole-wheat bread, brown rice, whole-grain or wheat pasta, quinoa, popcorn without added fat or salt (air popped).  Eat Less Often: White bread, white pasta, white rice, cornbread. Vegetables (4 to 5 servings daily)  Eat More Often: Fresh, frozen, and canned vegetables. Vegetables may be raw, steamed, roasted, or grilled with a minimal amount of fat.  Eat Less Often/Avoid: Creamed or fried vegetables. Vegetables in a cheese sauce. Fruit (4 to 5 servings daily)  Eat More Often: All fresh, canned (in natural juice), or frozen fruits. Dried fruits without added sugar. One hundred percent fruit juice ( cup [237 mL] daily).  Eat Less Often: Dried fruits with added sugar. Canned fruit in light or heavy syrup. [employer], Fish, and Poultry (2 servings or less daily. One serving is 3 to 4 oz [85-114 g]).  Eat More Often: Ninety percent or leaner ground beef, tenderloin, sirloin. Round cuts of beef, chicken breast, [place] breast. All fish. Grill, bake, or broil your meat. Nothing should be fried.  Eat Less  Often/Avoid: Fatty cuts of meat, [place], or chicken leg, thigh, or wing. Fried cuts of meat or fish. Dairy (2 to 3 servings)  Eat More Often: Low-fat or fat-free milk, low-fat plain or light yogurt, reduced-fat or part-skim cheese.  Eat Less Often/Avoid: Milk (whole, 2%).Whole milk yogurt. Full-fat cheeses. Nuts, Seeds, and Legumes (4 to 5 servings per week)  Eat More Often: All without added salt.  Eat Less Often/Avoid: Salted nuts and seeds, canned beans with added salt. Fats and Sweets (limited)  Eat More Often: Vegetable oils, tub margarines without trans fats, sugar-free gelatin. Mayonnaise and salad dressings.  Eat Less Often/Avoid: Coconut oils, palm oils, butter, stick margarine, cream, half and half, cookies, candy, pie. FOR MORE INFORMATION The Dash Diet Eating Plan: www.dashdiet.org Document Released: 11/17/2011 Document Revised: 02/20/2012 Document Reviewed: 11/17/2011 [facility] Patient Information 2014 [place], [place].

## 2014-04-16 NOTE — Progress Notes (Signed)
Subjective:    Patient ID: [name], male    DOB: 1971/05/12, 43 y.o.   MRN: [id number]  HPI Doing some better Things have calmed down--stress with the kids, etc Her jealousy seems some better--they are working on this Legal issue due to be dealt with (where she was arrested) He feels they are back on the same team in dealing with the kids He still has financial issues--- now putting kids in [facility] for better faith and discipline experience  Had some left arm stiffness and hand tingling--- 2-3 days ago No known injury Tingling in ulnar distribution  Weight is back up some Hard to find time for exercise Discussed proper eating  No current outpatient prescriptions on file prior to visit.   No current facility-administered medications on file prior to visit.    No Known Allergies  Past Medical History  Diagnosis Date  . Rectal bleeding     polyp removed 01/19/2012 pre canerous  . Complication of anesthesia 1996    ran a fever after sinus surgery    Past Surgical History  Procedure Laterality Date  . Nasal turbinate reduction  1996  . Lasik  2006    bilateral  . Colonoscopy    . Chondroplasty  02/27/2012    Procedure: CHONDROPLASTY;  Surgeon: Alta Corning, MD;  Location: [facility];  Service: Orthopedics;  Laterality: Right;  patella-femoral joint    Family History  Problem Relation Age of Onset  . Multiple sclerosis Mother   . Anesthesia problems Mother   . Asthma      children; seasonal  . Colon cancer Neg Hx   . Colitis Father     questionable  . Arthritis Son     juvinile rheumatoid    History   Social History  . Marital Status: Married    Spouse Name: N/A    Number of Children: N/A  . 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 Smoking status: Never Smoker   . Smokeless tobacco: Never Used  . Alcohol Use: Yes     Comment: occasional beer/wine  . Drug Use: No  . Sexual  Activity: Not on file   Other Topics Concern  . Not on file   Social History Narrative   5 children and 3 step children   Review of Systems  Constitutional: Positive for unexpected weight change. Negative for fatigue.       Wears seat belt  HENT: Negative for dental problem, hearing loss and tinnitus.        Regular with dentist  Eyes: Negative for visual disturbance.       Some refractive change No diplopia or unilateral vision  Respiratory: Positive for cough and chest tightness. Negative for shortness of breath.        Tightness due to stress Slight tickle cough  Cardiovascular: Positive for leg swelling. Negative for chest pain and palpitations.       May have slight swelling in feet at end of day  Gastrointestinal: Negative for nausea, vomiting, abdominal pain, constipation and blood in stool.       No heartburn   Endocrine: Negative for cold intolerance and heat intolerance.  Genitourinary: Negative for urgency, frequency and difficulty urinating.       No sexual problems  Musculoskeletal: Negative for arthralgias, back pain and joint swelling.  Skin: Positive for rash.       Ongoing eczema--  has clobetasol cream  Allergic/Immunologic: Positive for environmental allergies. Negative for immunocompromised state.       This year pollen not as bad for him  Neurological: Positive for numbness. Negative for dizziness, syncope, weakness, light-headedness and headaches.  Hematological: Negative for adenopathy. Does not bruise/bleed easily.  Psychiatric/Behavioral: Negative for sleep disturbance and dysphoric mood. The patient is nervous/anxious.        Sleeping better with new mattress       Objective:   Physical Exam  Constitutional: He appears well-developed and well-nourished. No distress.  HENT:  Head: Normocephalic and atraumatic.  Right Ear: External ear normal.  Left Ear: External ear normal.  Mouth/Throat: Oropharynx is clear and moist. No oropharyngeal exudate.    Eyes: Conjunctivae and EOM are normal. Pupils are equal, round, and reactive to light.  Neck: Normal range of motion. Neck supple. No thyromegaly present.  Cardiovascular: Normal rate, regular rhythm, normal heart sounds and intact distal pulses.  Exam reveals no gallop.   No murmur heard. Pulmonary/Chest: Effort normal and breath sounds normal. No respiratory distress. He has no wheezes. He has no rales.  Abdominal: Soft. There is no tenderness.  Musculoskeletal: He exhibits no edema and no tenderness.  Lymphadenopathy:    He has no cervical adenopathy.  Skin: No erythema.  Scaling in spots Multiple benign nevi  Psychiatric: He has a normal mood and affect. His behavior is normal.          Assessment & Plan:

## 2014-04-16 NOTE — Progress Notes (Signed)
Pre visit review using our clinic review tool, if applicable. No additional management support is needed unless otherwise documented below in the visit note. 

## 2014-04-16 NOTE — Assessment & Plan Note (Signed)
Some better

## 2014-04-16 NOTE — Assessment & Plan Note (Signed)
[name] recheck but no primary prevention

## 2014-09-03 ENCOUNTER — Emergency Department (HOSPITAL_COMMUNITY)
Admission: EM | Admit: 2014-09-03 | Discharge: 2014-09-04 | Disposition: A | Discharge status: Home / Self Care | Payer: BC Managed Care – PPO | Attending: Emergency Medicine | Admitting: Emergency Medicine

## 2014-09-03 ENCOUNTER — Encounter (HOSPITAL_COMMUNITY): Payer: Self-pay | Admitting: Emergency Medicine

## 2014-09-03 ENCOUNTER — Emergency Department (HOSPITAL_COMMUNITY): Payer: BC Managed Care – PPO

## 2014-09-03 DIAGNOSIS — R079 Chest pain, unspecified: Secondary | ICD-10-CM | POA: Diagnosis present

## 2014-09-03 DIAGNOSIS — Z862 Personal history of diseases of the blood and blood-forming organs and certain disorders involving the immune mechanism: Secondary | ICD-10-CM | POA: Insufficient documentation

## 2014-09-03 DIAGNOSIS — Z8719 Personal history of other diseases of the digestive system: Secondary | ICD-10-CM | POA: Diagnosis not present

## 2014-09-03 DIAGNOSIS — Z7982 Long term (current) use of aspirin: Secondary | ICD-10-CM | POA: Diagnosis not present

## 2014-09-03 DIAGNOSIS — Z8639 Personal history of other endocrine, nutritional and metabolic disease: Secondary | ICD-10-CM | POA: Diagnosis not present

## 2014-09-03 LAB — BASIC METABOLIC PANEL
Anion gap: 13 (ref 5–15)
BUN: 19 mg/dL (ref 6–23)
CALCIUM: 9.5 mg/dL (ref 8.4–10.5)
CHLORIDE: 104 meq/L (ref 96–112)
CO2: 24 meq/L (ref 19–32)
Creatinine, Ser: 0.86 mg/dL (ref 0.50–1.35)
GFR calc Af Amer: >90 mL/min (ref >90)
GFR calc non Af Amer: >90 mL/min (ref >90)
GLUCOSE: 106 mg/dL — AB (ref 70–99)
Potassium: 4.1 mEq/L (ref 3.7–5.3)
Sodium: 141 mEq/L (ref 137–147)

## 2014-09-03 LAB — CBC
HEMATOCRIT: 41.5 % (ref 39.0–52.0)
Hemoglobin: 14.9 g/dL (ref 13.0–17.0)
MCH: 32.1 pg (ref 26.0–34.0)
MCHC: 35.9 g/dL (ref 30.0–36.0)
MCV: 89.4 fL (ref 78.0–100.0)
PLATELETS: 235 10*3/uL (ref 150–400)
RBC: 4.64 MIL/uL (ref 4.22–5.81)
RDW: 12.5 % (ref 11.5–15.5)
WBC: 6 10*3/uL (ref 4.0–10.5)

## 2014-09-03 LAB — I-STAT TROPONIN, ED
TROPONIN I, POC: 0 ng/mL (ref 0.00–0.08)
Troponin i, poc: 0 ng/mL (ref 0.00–0.08)

## 2014-09-03 NOTE — ED Notes (Signed)
Pt reports onset one hour ago of mid chest pain that he describes as "squeezing pain." having mild nausea, diaphoresis and sob. ekg done at triage, airway intact.

## 2014-09-03 NOTE — ED Provider Notes (Signed)
CSN: [id number]     Arrival date & time 09/03/14  1850 History   First MD Initiated Contact with Patient 09/03/14 2038     Chief Complaint  Patient presents with  . Chest Pain     (Consider location/radiation/quality/duration/timing/severity/associated sxs/prior Treatment) HPI Comments: Patient is a 43 year old male past medical history significant for hyperlipidemia presenting to the emergency department for acute onset mid chest pain with radiation into her throat the patient describes as a squeezing pain with associated mild nausea, diaphoresis and shortness of breath. He also endorses he had a mild headache with bilateral hand tingling, those have also resolved. He states the pain only lasted a few minutes. He has not had any recurrence of his pain. He states the pain began at 6:30 PM this evening. He states he had a stress test and echocardiogram 2 years ago without any abnormalities. No early familial cardiac history.  Patient is a 43 y.o. male presenting with chest pain.  Chest Pain Associated symptoms: diaphoresis (resolved), nausea (resolved) and shortness of breath (resolved)     Past Medical History  Diagnosis Date  . Rectal bleeding     polyp removed 01/19/2012 pre canerous  . Complication of anesthesia 1996    ran a fever after sinus surgery  . Hyperlipidemia    Past Surgical History  Procedure Laterality Date  . Nasal turbinate reduction  1996  . Lasik  2006    bilateral  . Colonoscopy    . Chondroplasty  02/27/2012    Procedure: CHONDROPLASTY;  Surgeon: Alta Corning, MD;  Location: [facility];  Service: Orthopedics;  Laterality: Right;  patella-femoral joint   Family History  Problem Relation Age of Onset  . Multiple sclerosis Mother   . Anesthesia problems Mother   . Asthma      children; seasonal  . Colon cancer Neg Hx   . Colitis Father     questionable  . Arthritis Son     juvinile rheumatoid   History  Substance Use Topics  . Smoking status: Never Smoker    . Smokeless tobacco: Never Used  . Alcohol Use: Yes     Comment: occasional beer/wine    Review of Systems  Constitutional: Positive for diaphoresis (resolved).  Respiratory: Positive for shortness of breath (resolved).   Cardiovascular: Positive for chest pain (resolved).  Gastrointestinal: Positive for nausea (resolved).  All other systems reviewed and are negative.     Allergies  Review of patient's allergies indicates no known allergies.  Home Medications   Prior to Admission medications   Medication Sig Start Date End Date Taking? Authorizing Provider  aspirin 81 MG tablet Take 162 mg by mouth once.   Yes Historical Provider, MD   BP 139/74  Pulse 68  Temp(Src) 98 F (36.7 C) (Oral)  Resp 17  SpO2 95% Physical Exam  Nursing note and vitals reviewed. Constitutional: He is oriented to person, place, and time. He appears well-developed and well-nourished. No distress.  HENT:  Head: Normocephalic and atraumatic.  Right Ear: External ear normal.  Left Ear: External ear normal.  Nose: Nose normal.  Mouth/Throat: Oropharynx is clear and moist. No oropharyngeal exudate.  Eyes: Conjunctivae and EOM are normal.  Neck: Neck supple.  Cardiovascular: Normal rate, regular rhythm, normal heart sounds and intact distal pulses.   Pulmonary/Chest: Effort normal and breath sounds normal. No respiratory distress.  Abdominal: Soft. Bowel sounds are normal. There is no tenderness.  Musculoskeletal: Normal range of motion. He exhibits no edema.  Neurological: He is alert and oriented to person, place, and time.  Sensation grossly intact.   Skin: Skin is warm and dry. He is not diaphoretic.    ED Course  Procedures (including critical care time) Medications - No data to display  Labs Review Labs Reviewed  BASIC METABOLIC PANEL - Abnormal; Notable for the following:    Glucose, Bld 106 (*)    All other components within normal limits  CBC  I-STAT TROPOININ, ED  Randolm Idol, ED    Imaging Review Dg Chest 2 View  09/03/2014   CLINICAL DATA:  43 year old male with chest pain and nausea.  EXAM: CHEST - 2 VIEW  COMPARISON:  01/03/2008  FINDINGS: Cardiomediastinal silhouette within normal limits in size and contour. No pulmonary vascular congestion is evident.  No confluent airspace disease, pneumothorax, pleural effusion.  No displaced fracture.  Unremarkable appearance of the upper abdomen.  IMPRESSION: No radiographic evidence of acute cardiopulmonary disease  Signed,  Dulcy Fanny. Earleen Newport, DO  [facility]  [facility]   Electronically Signed   By: Corrie Mckusick O.D.   On: 09/03/2014 22:04     EKG Interpretation   Date/Time:  Wednesday September 03 2014 18:53:35 EDT Ventricular Rate:  85 PR Interval:  194 QRS Duration: 162 QT Interval:  414 QTC Calculation: 492 R Axis:   -126 Text Interpretation:  Normal sinus rhythm Right bundle branch block  Inferior infarct , age undetermined Anterolateral infarct , age  undetermined Abnormal ECG No prior Confirmed by Mingo Amber  MD, BLAIR [phone])  on 09/03/2014 8:39:47 PM      Patient states he has RBBB, this is not new for him.   Heart Score 3  Plan to delta trop patient.   MDM   Final diagnoses:  Chest pain, central    Filed Vitals:   09/03/14 2330  BP: 139/74  Pulse: 68  Temp:   Resp: 17   Afebrile, NAD, non-toxic appearing, AAOx4. Patient is to be discharged with recommendation to follow up with PCP in regards to today's hospital visit. Chest pain is not likely of cardiac or pulmonary etiology d/t presentation, perc negative, VSS, no tracheal deviation, no JVD or new murmur, RRR, breath sounds equal bilaterally, EKG without acute abnormalities, negative delta troponin, and negative CXR. Pt has been advised to follow up with cardiology and his PCP and return to the ED is CP becomes exertional, associated with diaphoresis or nausea, radiates to left jaw/arm,  worsens or becomes concerning in any way. Pt appears reliable for follow up and is agreeable to discharge.   Case has been discussed with  Dr. Mingo Amber who agrees with the above plan to discharge.       Harlow Mares, PA-C 09/04/14 0112

## 2014-09-03 NOTE — ED Notes (Signed)
Patient transported to X-ray 

## 2014-09-04 ENCOUNTER — Telehealth: Payer: Self-pay | Admitting: Internal Medicine

## 2014-09-04 ENCOUNTER — Encounter: Payer: Self-pay | Admitting: Internal Medicine

## 2014-09-04 DIAGNOSIS — R079 Chest pain, unspecified: Secondary | ICD-10-CM

## 2014-09-04 NOTE — Telephone Encounter (Signed)
Rosaria Ferries made card appointment 10/8 dr Rockey Situ  Pt wanted to be seen sooner   Made appointment with dr Silvio Pate 9/25 @ 11 pt aware of appointment date and time

## 2014-09-04 NOTE — Telephone Encounter (Signed)
Pt called asking what cardiology office the er dr wanted him to see.  He stated he didn't have discharge note.  I had Dee look to see if she could find discharge note in pt chart.  She couldn't find name of cardiology office they recommended him to go to  [facility] said to  give lb cardiology.  And to make him an appointment to see dr Haynes Bast appointment with dr Silvio Pate wed 9/30 @ 12 pt stated he had to check with his wife for his schedule. He wanted to know if he still needed to go to cardiologist I told pt I wasn't clinical and he needed to do what the discharge note said  I put pt on hold to get cardiology number in [place]  Went back to let pt know number he had put the phone down  I could hear people talking but pt never came back to phone

## 2014-09-04 NOTE — Discharge Instructions (Signed)
Please follow up with your primary care physician in 1-2 days. If you do not have one please call the [place] number listed above. Please follow up with the cardiology office to schedule a follow up appointment.  Please read all discharge instructions and return precautions.    Chest Pain (Nonspecific) It is often hard to give a specific diagnosis for the cause of chest pain. There is always a chance that your pain could be related to something serious, such as a heart attack or a blood clot in the lungs. You need to follow up with your health care provider for further evaluation. CAUSES   Heartburn.  Pneumonia or bronchitis.  Anxiety or stress.  Inflammation around your heart (pericarditis) or lung (pleuritis or pleurisy).  A blood clot in the lung.  A collapsed lung (pneumothorax). It can develop suddenly on its own (spontaneous pneumothorax) or from trauma to the chest.  Shingles infection (herpes zoster virus). The chest wall is composed of bones, muscles, and cartilage. Any of these can be the source of the pain.  The bones can be bruised by injury.  The muscles or cartilage can be strained by coughing or overwork.  The cartilage can be affected by inflammation and become sore (costochondritis). DIAGNOSIS  Lab tests or other studies may be needed to find the cause of your pain. Your health care provider may have you take a test called an ambulatory electrocardiogram (ECG). An ECG records your heartbeat patterns over a 24-hour period. You may also have other tests, such as:  Transthoracic echocardiogram (TTE). During echocardiography, sound waves are used to evaluate how blood flows through your heart.  Transesophageal echocardiogram (TEE).  Cardiac monitoring. This allows your health care provider to monitor your heart rate and rhythm in real time.  Holter monitor. This is a portable device that records your heartbeat and can help diagnose heart  arrhythmias. It allows your health care provider to track your heart activity for several days, if needed.  Stress tests by exercise or by giving medicine that makes the heart beat faster. TREATMENT   Treatment depends on what may be causing your chest pain. Treatment may include:  Acid blockers for heartburn.  Anti-inflammatory medicine.  Pain medicine for inflammatory conditions.  Antibiotics if an infection is present.  You may be advised to change lifestyle habits. This includes stopping smoking and avoiding alcohol, caffeine, and chocolate.  You may be advised to keep your head raised (elevated) when sleeping. This reduces the chance of acid going backward from your stomach into your esophagus. Most of the time, nonspecific chest pain will improve within 2-3 days with rest and mild pain medicine.  HOME CARE INSTRUCTIONS   If antibiotics were prescribed, take them as directed. Finish them even if you start to feel better.  For the next few days, avoid physical activities that bring on chest pain. Continue physical activities as directed.  Do not use any tobacco products, including cigarettes, chewing tobacco, or electronic cigarettes.  Avoid drinking alcohol.  Only take medicine as directed by your health care provider.  Follow your health care provider's suggestions for further testing if your chest pain does not go away.  Keep any follow-up appointments you made. If you do not go to an appointment, you could develop lasting (chronic) problems with pain. If there is any problem keeping an appointment, call to reschedule. SEEK MEDICAL CARE IF:   Your chest pain does not go away, even after treatment.  You have a rash with blisters on your chest.  You have a fever. SEEK IMMEDIATE MEDICAL CARE IF:   You have increased chest pain or pain that spreads to your arm, neck, jaw, back, or abdomen.  You have shortness of breath.  You have an increasing cough, or you cough up  blood.  You have severe back or abdominal pain.  You feel nauseous or vomit.  You have severe weakness.  You faint.  You have chills. This is an emergency. Do not wait to see if the pain will go away. Get medical help at once. Call your local emergency services (911 in [place] Do not drive yourself to the hospital. MAKE SURE YOU:   Understand these instructions.  Will watch your condition.  Will get help right away if you are not doing well or get worse. Document Released: 09/07/2005 Document Revised: 12/03/2013 Document Reviewed: 07/03/2008 [facility] Patient Information 2015 [place], [place]. This information is not intended to replace advice given to you by your health care provider. Make sure you discuss any questions you have with your health care provider.

## 2014-09-04 NOTE — Telephone Encounter (Signed)
Responded to his My Chart comment about the cardiologist. Reviewed his ER note from yesterday.  He is very anxious about his past history of heart murmur and chest symptoms (though work up negative in ER) Had testing 2 years ago (?[place]) but not in EPIC Wants to see cardiologist again---will refer to [facility] in [place]

## 2014-09-04 NOTE — Telephone Encounter (Signed)
I spoke to him after this and put in referral for cardiology. He is very anxious about what is going on and should see cardiology. Can hold off on appt with me

## 2014-09-05 ENCOUNTER — Ambulatory Visit: Payer: BC Managed Care – PPO | Admitting: Internal Medicine

## 2014-09-05 DIAGNOSIS — Z0289 Encounter for other administrative examinations: Secondary | ICD-10-CM

## 2014-09-05 NOTE — Telephone Encounter (Signed)
I called [facility] just now for any cancellations and they gave him an Appt with Dr Fletcher Anon for this Monday, Set 28th at 1:30pm. Gave the paper to [facility] to give to your patient when he arrives here for his appt.

## 2014-09-08 ENCOUNTER — Telehealth: Payer: Self-pay | Admitting: Internal Medicine

## 2014-09-08 ENCOUNTER — Ambulatory Visit (INDEPENDENT_AMBULATORY_CARE_PROVIDER_SITE_OTHER): Payer: BC Managed Care – PPO | Admitting: Cardiovascular Disease

## 2014-09-08 ENCOUNTER — Encounter: Payer: Self-pay | Admitting: Cardiovascular Disease

## 2014-09-08 VITALS — BP 112/84 | HR 70 | Ht 76.0 in | Wt 309.2 lb

## 2014-09-08 DIAGNOSIS — R0602 Shortness of breath: Secondary | ICD-10-CM

## 2014-09-08 DIAGNOSIS — R079 Chest pain, unspecified: Secondary | ICD-10-CM

## 2014-09-08 NOTE — Assessment & Plan Note (Signed)
One episode of chest pain with quality concerning for angina although he only had one episode. I recommend evaluation with a treadmill nuclear stress test. His baseline ECG is abnormal with right bundle branch block and repolarization abnormalities. Thus, a treadmill stress test alone is not going to be sufficient.

## 2014-09-08 NOTE — Telephone Encounter (Signed)
Patient did not come for their scheduled appointment 09/05/14 for er follow up  Please let me know if the patient needs to be contacted immediately for follow up or if no follow up is necessary.

## 2014-09-08 NOTE — Progress Notes (Signed)
Primary care physician: Dr. Silvio Pate  HPI  This is a pleasant 43 year old male who was referred for evaluation of chest pain. He reports having a heart murmur due to unspecified mitral valve disease. He has history of obesity and hyperlipidemia. No history of diabetes or hypertension. No smoking or family history of premature coronary artery disease. He reports [facility] than one [facility] due to atypical chest pain. Stress test was unremarkable. 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 This lasted for about 10 minutes and was associated with shortness of breath. He has noticed worsening exertional dyspnea since then. He went to the emergency room at [facility]. His labs were unremarkable including negative cardiac enzymes. EKG showed right bundle branch block. He reports no similar episodes since then although he reports continued worsening exertional dyspnea.  No Known Allergies   No current outpatient prescriptions on file prior to visit.   No current facility-administered medications on file prior to visit.     Past Medical History  Diagnosis Date  . Rectal bleeding     polyp removed 01/19/2012 pre canerous  . Complication of anesthesia 1996    ran a fever after sinus surgery  . Hyperlipidemia   . Heart murmur   . Mitral valve prolapse      Past Surgical History  Procedure Laterality Date  . Nasal turbinate reduction  1996  . Lasik  2006    bilateral  . Colonoscopy    . Chondroplasty  02/27/2012    Procedure: CHONDROPLASTY;  Surgeon: Alta Corning, MD;  Location: [place];  Service: Orthopedics;  Laterality: Right;  patella-femoral joint     Family History  Problem Relation Age of Onset  . Multiple sclerosis Mother   . Anesthesia problems Mother   . Hypertension Mother   . Asthma      children;  seasonal  . Colon cancer Neg Hx   . Colitis Father     questionable  . Arthritis Son     juvinile rheumatoid     History   Social History  . Marital Status: Married    Spouse Name: N/A    Number of Children: N/A  . 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 Smoking status: Never Smoker   . Smokeless tobacco: Never Used  . Alcohol Use: Yes     Comment: occasional beer/wine  . Drug Use: No  . Sexual Activity: Not on file   Other Topics Concern  . Not on file   Social History Narrative   5 children and 3 step children     ROS A 10 point review of system was performed. It is negative other than that mentioned in the history of present illness.   PHYSICAL EXAM   BP 112/84  Pulse 70  Ht 6\' 4"  (1.93 m)  Wt 309 lb 4 oz (140.275 kg)  BMI 37.66 kg/m2 Constitutional: He is oriented to person, place, and time. He appears well-developed and well-nourished. No distress.  HENT: No nasal discharge.  Head: Normocephalic and atraumatic.  Eyes: Pupils are equal and round.  No discharge. Neck: Normal range of motion. Neck supple. No JVD present. No thyromegaly present.  Cardiovascular: Normal rate, regular rhythm, normal heart sounds. Exam reveals no gallop  and no friction rub. No murmur heard.  Pulmonary/Chest: Effort normal and breath sounds normal. No stridor. No respiratory distress. He has no wheezes. He has no rales. He exhibits no tenderness.  Abdominal: Soft. Bowel sounds are normal. He exhibits no distension. There is no tenderness. There is no rebound and no guarding.  Musculoskeletal: Normal range of motion. He exhibits no edema and no tenderness.  Neurological: He is alert and oriented to person, place, and time. Coordination normal.  Skin: Skin is warm and dry. No rash noted. He is not diaphoretic. No erythema. No pallor.  Psychiatric: He has a normal mood and affect. His behavior is normal.  Judgment and thought content normal.       ACZ:YSAYT  Rhythm  -Right bundle branch block.   ABNORMAL     ASSESSMENT AND PLAN

## 2014-09-08 NOTE — Assessment & Plan Note (Signed)
Given worsening exertional dyspnea and reported history of mitral valve disease, I requested an echocardiogram for evaluation. If cardiac workup is unremarkable and he continues to have symptoms, further pulmonary evaluation might be needed.

## 2014-09-08 NOTE — Patient Instructions (Addendum)
[place]  Your caregiver has ordered a Stress Test with nuclear imaging. The purpose of this test is to evaluate the blood supply to your heart muscle. This procedure is referred to as a "Non-Invasive Stress Test." This is because other than having an IV started in your vein, nothing is inserted or "invades" your body. Cardiac stress tests are done to find areas of poor blood flow to the heart by determining the extent of coronary artery disease (CAD). Some patients exercise on a treadmill, which naturally increases the blood flow to your heart, while others who are  unable to walk on a treadmill due to physical limitations have a pharmacologic/chemical stress agent called Lexiscan . This medicine will mimic walking on a treadmill by temporarily increasing your coronary blood flow.   Please note: these test may take anywhere between 2-4 hours to complete  PLEASE REPORT TO [facility] WILL DIRECT YOU WHERE TO GO  Date of Procedure:  09/09/14 and 09/10/14  Arrival Time for Procedure:   At [place] am on 9/29 and 0745 am on 9/30      PLEASE NOTIFY THE OFFICE AT LEAST 24 HOURS IN ADVANCE IF YOU ARE UNABLE TO KEEP YOUR APPOINTMENT.  [phone] AND  PLEASE NOTIFY NUCLEAR MEDICINE AT [facility] AT LEAST 24 HOURS IN ADVANCE IF YOU ARE UNABLE TO KEEP YOUR APPOINTMENT. [phone]  How to prepare for your Myoview test:  1. Do not eat or drink after midnight 2. No caffeine for 24 hours prior to test 3. No smoking 24 hours prior to test. 4. Your medication may be taken with water.  If your doctor stopped a medication because of this test, do not take that medication. 5. Ladies, please do not wear dresses.  Skirts or pants are appropriate. Please wear a short sleeve shirt. 6. No perfume, cologne or lotion. 7. Wear comfortable walking shoes. No heels!       Your physician has requested that you have an echocardiogram. Echocardiography is a painless test  that uses sound waves to create images of your heart. It provides your doctor with information about the size and shape of your heart and how well your heart's chambers and valves are working. This procedure takes approximately one hour. There are no restrictions for this procedure.  Your physician recommends that you schedule a follow-up appointment in:  As needed with Dr. Fletcher Anon

## 2014-09-08 NOTE — ED Provider Notes (Signed)
Medical screening examination/treatment/procedure(s) were performed by non-physician practitioner and as supervising physician I was immediately available for consultation/collaboration.   EKG Interpretation   Date/Time:  Wednesday September 03 2014 18:53:35 EDT Ventricular Rate:  85 PR Interval:  194 QRS Duration: 162 QT Interval:  414 QTC Calculation: 492 R Axis:   -126 Text Interpretation:  Normal sinus rhythm Right bundle branch block  Inferior infarct , age undetermined Anterolateral infarct , age  undetermined Abnormal ECG No prior Confirmed by Mingo Amber  MD, Tyrae Alcoser ([id number])  on 09/03/2014 8:39:47 PM        Evelina Bucy, MD 09/08/14 1520

## 2014-09-08 NOTE — Telephone Encounter (Signed)
No follow up is necessary He has gone to see a cardiologist

## 2014-09-09 ENCOUNTER — Ambulatory Visit: Payer: Self-pay | Admitting: Cardiovascular Disease

## 2014-09-09 DIAGNOSIS — R079 Chest pain, unspecified: Secondary | ICD-10-CM

## 2014-09-10 ENCOUNTER — Other Ambulatory Visit: Payer: Self-pay

## 2014-09-10 DIAGNOSIS — R079 Chest pain, unspecified: Secondary | ICD-10-CM

## 2014-09-18 ENCOUNTER — Ambulatory Visit: Payer: BC Managed Care – PPO | Admitting: Cardiovascular Disease

## 2014-09-23 ENCOUNTER — Other Ambulatory Visit: Payer: BC Managed Care – PPO

## 2014-10-24 ENCOUNTER — Encounter: Payer: Self-pay | Admitting: Internal Medicine

## 2014-10-27 ENCOUNTER — Encounter: Payer: Self-pay | Admitting: Internal Medicine

## 2014-10-28 ENCOUNTER — Other Ambulatory Visit: Payer: BC Managed Care – PPO

## 2014-11-13 ENCOUNTER — Other Ambulatory Visit (INDEPENDENT_AMBULATORY_CARE_PROVIDER_SITE_OTHER): Payer: BC Managed Care – PPO

## 2014-11-13 DIAGNOSIS — R0602 Shortness of breath: Secondary | ICD-10-CM

## 2014-11-13 DIAGNOSIS — R079 Chest pain, unspecified: Secondary | ICD-10-CM

## 2014-11-13 DIAGNOSIS — R011 Cardiac murmur, unspecified: Secondary | ICD-10-CM

## 2014-11-21 ENCOUNTER — Ambulatory Visit (INDEPENDENT_AMBULATORY_CARE_PROVIDER_SITE_OTHER): Payer: BC Managed Care – PPO | Admitting: Podiatry

## 2014-11-21 VITALS — BP 107/73 | HR 65 | Resp 16 | Ht 76.0 in | Wt 300.0 lb

## 2014-11-21 DIAGNOSIS — L600 Ingrowing nail: Secondary | ICD-10-CM

## 2014-11-21 NOTE — Progress Notes (Signed)
   Subjective:    Patient ID: [name], male    DOB: [birth year], 43 y.o.   MRN: [id number]  HPI Comments: Ingrown toenail left great , lateral corner. Puffy and sore      Review of Systems  All other systems reviewed and are negative.      Objective:   Physical Exam        Assessment & Plan:

## 2014-11-21 NOTE — Patient Instructions (Signed)

## 2014-11-22 NOTE — Progress Notes (Signed)
Subjective:     Patient ID: [name], male   DOB: 1971/01/22, 43 y.o.   MRN: [id number]  HPI patient states that had a chronic ingrown toenail of my left big toe lateral border and I've tried to soak and trimming without relief and there was drainage which has resolved but pain remains   Review of Systems  All other systems reviewed and are negative.      Objective:   Physical Exam  Constitutional: He is oriented to person, place, and time.  Cardiovascular: Intact distal pulses.   Musculoskeletal: Normal range of motion.  Neurological: He is oriented to person, place, and time.  Skin: Skin is warm.  Nursing note and vitals reviewed.  neurovascular status intact with muscle strength adequate range of motion within normal limits of the subtalar and midtarsal joint. 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 I've recommended removal of the corner and I explained procedure to patient and risk. Patient wants surgery and today I infiltrated 60 mg Xylocaine Marcaine mixture remove the lateral border exposed matrix and apply chemical 3 applications phenol 30 seconds followed by alcohol lavaged and sterile dressing. Gave instructions on soaks and reappoint

## 2014-12-12 HISTORY — PX: HEEL SPUR SURGERY: SHX665

## 2014-12-29 ENCOUNTER — Telehealth: Payer: Self-pay | Admitting: Internal Medicine

## 2014-12-29 ENCOUNTER — Encounter: Payer: Self-pay | Admitting: Internal Medicine

## 2014-12-29 MED ORDER — CEPHALEXIN 500 MG PO CAPS: 500.0000 mg | ORAL_CAPSULE | Freq: Four times a day (QID) | ORAL | Status: DC

## 2014-12-29 NOTE — Telephone Encounter (Signed)
Spoke with patient and advised results   

## 2014-12-29 NOTE — Telephone Encounter (Signed)
Saw him outside of office. Had left great toenail removed by podiatrist some time ago "hasn't taken care of it" and now seems infected  I checked it and there was clear paronychia but no worrisome findings Will treat empirically with keflex Advised soaks   Dee, Please let him know the Rx has been sent

## 2015-02-10 HISTORY — PX: KNEE ARTHROSCOPY: SUR90

## 2015-03-03 ENCOUNTER — Encounter: Payer: Self-pay | Admitting: Internal Medicine

## 2015-03-03 NOTE — Telephone Encounter (Signed)
Okay to send refill to cover her till her next visit Check with him on her name because I don't remember it offhand

## 2015-03-30 ENCOUNTER — Encounter: Payer: Self-pay | Admitting: Internal Medicine

## 2015-04-21 ENCOUNTER — Ambulatory Visit (INDEPENDENT_AMBULATORY_CARE_PROVIDER_SITE_OTHER): Payer: BLUE CROSS/BLUE SHIELD | Admitting: Internal Medicine

## 2015-04-21 ENCOUNTER — Encounter: Payer: Self-pay | Admitting: Internal Medicine

## 2015-04-21 VITALS — BP 120/80 | HR 67 | Temp 98.5°F | Ht 76.0 in | Wt 300.0 lb

## 2015-04-21 DIAGNOSIS — Z0000 Encounter for general adult medical examination without abnormal findings: Secondary | ICD-10-CM | POA: Diagnosis not present

## 2015-04-21 DIAGNOSIS — Z23 Encounter for immunization: Secondary | ICD-10-CM

## 2015-04-21 DIAGNOSIS — E785 Hyperlipidemia, unspecified: Secondary | ICD-10-CM

## 2015-04-21 LAB — COMPREHENSIVE METABOLIC PANEL
ALBUMIN: 4.2 g/dL (ref 3.5–5.2)
ALT: 55 U/L — ABNORMAL HIGH (ref 0–53)
AST: 26 U/L (ref 0–37)
Alkaline Phosphatase: 56 U/L (ref 39–117)
BUN: 23 mg/dL (ref 6–23)
CHLORIDE: 103 meq/L (ref 96–112)
CO2: 30 mEq/L (ref 19–32)
Calcium: 9.9 mg/dL (ref 8.4–10.5)
Creatinine, Ser: 1.04 mg/dL (ref 0.40–1.50)
GFR: 83.21 mL/min (ref >60.00)
Glucose, Bld: 101 mg/dL — ABNORMAL HIGH (ref 70–99)
Potassium: 4.3 mEq/L (ref 3.5–5.1)
SODIUM: 138 meq/L (ref 135–145)
TOTAL PROTEIN: 7.3 g/dL (ref 6.0–8.3)
Total Bilirubin: 0.7 mg/dL (ref 0.2–1.2)

## 2015-04-21 LAB — CBC WITH DIFFERENTIAL/PLATELET
BASOS ABS: 0 10*3/uL (ref 0.0–0.1)
BASOS PCT: 0.7 % (ref 0.0–3.0)
Eosinophils Absolute: 0.2 10*3/uL (ref 0.0–0.7)
Eosinophils Relative: 2.4 % (ref 0.0–5.0)
HEMATOCRIT: 43.5 % (ref 39.0–52.0)
HEMOGLOBIN: 15.2 g/dL (ref 13.0–17.0)
LYMPHS ABS: 2.8 10*3/uL (ref 0.7–4.0)
Lymphocytes Relative: 41 % (ref 12.0–46.0)
MCHC: 34.9 g/dL (ref 30.0–36.0)
MCV: 91.6 fl (ref 78.0–100.0)
MONOS PCT: 7.8 % (ref 3.0–12.0)
Monocytes Absolute: 0.5 10*3/uL (ref 0.1–1.0)
NEUTROS ABS: 3.3 10*3/uL (ref 1.4–7.7)
Neutrophils Relative %: 48.1 % (ref 43.0–77.0)
Platelets: 263 10*3/uL (ref 150.0–400.0)
RBC: 4.76 Mil/uL (ref 4.22–5.81)
RDW: 12.8 % (ref 11.5–15.5)
WBC: 6.8 10*3/uL (ref 4.0–10.5)

## 2015-04-21 LAB — LIPID PANEL
Cholesterol: 190 mg/dL (ref 0–200)
HDL: 40.5 mg/dL (ref >39.00)
LDL Cholesterol: 119 mg/dL — ABNORMAL HIGH (ref 0–99)
NONHDL: 149.5
Total CHOL/HDL Ratio: 5
Triglycerides: 154 mg/dL — ABNORMAL HIGH (ref 0.0–149.0)
VLDL: 30.8 mg/dL (ref 0.0–40.0)

## 2015-04-21 LAB — T4, FREE: Free T4: 0.96 ng/dL (ref 0.60–1.60)

## 2015-04-21 NOTE — Progress Notes (Signed)
Pre visit review using our clinic review tool, if applicable. No additional management support is needed unless otherwise documented below in the visit note. 

## 2015-04-21 NOTE — Addendum Note (Signed)
Addended by: Despina Hidden on: 04/21/2015 02:39 PM   Modules accepted: Orders

## 2015-04-21 NOTE — Assessment & Plan Note (Signed)
Mild Discussed meds--he prefers no statin for primary prevention Will recheck though

## 2015-04-21 NOTE — Assessment & Plan Note (Signed)
Healthy but still out of shape Fortunately normal stress test in past year Discussed fitness---but work and family stress continue Colonoscopy will be due in 2018 Tdap today

## 2015-04-21 NOTE — Progress Notes (Signed)
Subjective:    Patient ID: [name], male    DOB: [birth year], 44 y.o.   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   No current facility-administered medications on file prior to visit.    No Known Allergies  Past Medical History  Diagnosis Date  . Rectal bleeding     polyp removed 01/19/2012 pre canerous  . Complication of anesthesia 1996    ran a fever after sinus surgery  . Hyperlipidemia   . Heart murmur   . Mitral valve prolapse     Past Surgical History  Procedure Laterality Date  . Nasal turbinate reduction  1996  . Lasik  2006    bilateral  . Colonoscopy    . Chondroplasty  02/27/2012    Procedure: CHONDROPLASTY;  Surgeon: Alta Corning, MD;  Location: [place];  Service: Orthopedics;  Laterality: Right;  patella-femoral joint  . Knee arthroscopy Left 3/16    Family History  Problem Relation Age of Onset  . Multiple sclerosis Mother   . Anesthesia problems Mother   . Hypertension Mother   . Asthma      children; seasonal  . Colon cancer Neg Hx   . Colitis Father     questionable  . Arthritis Son     juvinile rheumatoid    History   Social History  . Marital Status: Married    Spouse Name: N/A  . Number of Children: N/A  . 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 Smoking status: Never Smoker   . Smokeless tobacco: Never Used  . Alcohol Use: Yes     Comment: occasional beer/wine  . Drug Use: No    . Sexual Activity: Not on file   Other Topics Concern  . Not on file   Social History Narrative   5 children and 3 step children    Review of Systems  Constitutional: Negative for fatigue and unexpected weight change.       Does stress eat at times and not exercising Wears seat belt  HENT: Negative for dental problem, hearing loss and tinnitus.        Keeps up with dentist  Eyes: Negative for visual disturbance.       No diplopia or unilateral vision loss Hard to focus at times  Respiratory: Negative for cough and shortness of breath.   Cardiovascular: Positive for chest pain. Negative for palpitations and leg swelling.       Gets tightness in chest when stressed  Gastrointestinal: Positive for anal bleeding. Negative for nausea, vomiting, abdominal pain and constipation.  Endocrine: Negative for polydipsia and polyuria.  Genitourinary: Negative for frequency and difficulty urinating.       No sexual problems  Musculoskeletal: Positive for arthralgias. Negative for back pain.  Skin: Negative for rash.       Ingrown nail debrided on left by podiatrist  Allergic/Immunologic: Negative for environmental allergies and immunocompromised  state.  Neurological: Negative for dizziness, syncope, weakness, light-headedness, numbness and headaches.       Some tingling in arms--if stressed out  Hematological: Negative for adenopathy. Does not bruise/bleed easily.  Psychiatric/Behavioral: Negative for sleep disturbance. The patient is nervous/anxious.        Lots of stress Some dysthymia from arguing with wife and stress with teenaged kids       Objective:   Physical Exam  Constitutional: He is oriented to person, place, and time. He appears well-developed and well-nourished. No distress.  HENT:  Head: Normocephalic and atraumatic.  Right Ear: External ear normal.  Left Ear: External ear normal.  Mouth/Throat: Oropharynx is clear and moist. No oropharyngeal exudate.  Eyes:  Conjunctivae and EOM are normal. Pupils are equal, round, and reactive to light.  Neck: Normal range of motion. Neck supple. No thyromegaly present.  Cardiovascular: Normal rate, regular rhythm, normal heart sounds and intact distal pulses.  Exam reveals no gallop.   No murmur heard. Pulmonary/Chest: Effort normal and breath sounds normal. No respiratory distress. He has no wheezes. He has no rales.  Abdominal: Soft. There is no tenderness.  Musculoskeletal: He exhibits no edema or tenderness.  Lymphadenopathy:    He has no cervical adenopathy.  Neurological: He is alert and oriented to person, place, and time.  Skin: No rash noted. No erythema.  Psychiatric: He has a normal mood and affect. His behavior is normal.          Assessment & Plan:

## 2015-05-13 ENCOUNTER — Ambulatory Visit: Payer: BLUE CROSS/BLUE SHIELD | Admitting: Internal Medicine

## 2015-05-20 ENCOUNTER — Ambulatory Visit: Payer: BLUE CROSS/BLUE SHIELD | Admitting: Internal Medicine

## 2015-05-27 ENCOUNTER — Telehealth: Payer: Self-pay | Admitting: *Deleted

## 2015-05-27 NOTE — Telephone Encounter (Signed)
Spoke w/ pt's wife.  She reports that pt is experiencing chest pain, HA & sweating all over for the past hr.  She requests an appt to be seen today. Advised her that Dr. Fletcher Anon is out of the office today; she is a pt of Dr. Rockey Situ and requests an appt w/ him. Advised her that if pt is symptomatic, he needs to seek immediate attention and for her to either take pt to ED or call EMS to have them evaluate him.  After some discussion, she verbalizes understanding and will proceed w/ taking pt to ED.

## 2015-05-27 NOTE — Telephone Encounter (Signed)
Pt wife calling stating that patient is having an episode He is having chills, like sweat chest pains  Pt c/o of Chest Pain: STAT if CP now or developed within 24 hours  1. Are you having CP right now? Little pains here and there.   2. Are you experiencing any other symptoms (ex. SOB, nausea, vomiting, sweating)? Sweating and chills, and headache   3. How long have you been experiencing CP?  For about an hour   4. Is your CP continuous or coming and going? Coming and going   5. Have you taken Nitroglycerin? Does not have it, took two aspirin   ?HR is about 90 right now

## 2015-07-03 ENCOUNTER — Telehealth: Payer: Self-pay | Admitting: *Deleted

## 2015-07-03 ENCOUNTER — Telehealth: Payer: Self-pay | Admitting: Internal Medicine

## 2015-07-03 NOTE — Telephone Encounter (Signed)
S/w pt wife Lattie Haw who states pt has been experiencing leg pain/tingling, headache, ear ache and throat pain since last night. States he has expressed some chest tightness as well but per pt wife, this may be anxiety.  Called PCP and states office is closed. Would like for pt to be seen. Informed wife that MD has no openings today and she should consider having him go to Urgent Care or ED if chest pain with left arm tingling, diaphoretic, nauseated. Lattie Haw verbalized understanding and states she will try and get him to go to Urgent Care.

## 2015-07-03 NOTE — Telephone Encounter (Signed)
Patient Name: BRAYLIN XU DOB: 1971-03-26 Initial Comment caller states husband has headache, leg pain and feels like something is stuck in his throat Nurse Assessment Nurse: Vallery Sa, RN, [place] Date/Time (Eastern Time): 07/03/2015 2:36:46 PM Confirm and document reason for call. If symptomatic, describe symptoms. ---Lattie Haw states that she is not with [facility] and she is unable to answer triage questions. She is unsure is he is having a swallowing emergency. She will have him call back for triage evaluation or will take him to an ER/urgent care facility as needed as there are no appointments at the office this afternoon. Encouraged to call back as needed. Has the patient traveled out of the country within the last 30 days? ---Not Applicable Does the patient require triage? ---No Guidelines Guideline Title Affirmed Question Affirmed Notes Final Disposition User Clinical Call Cameron, RN, Cathy Referrals GO TO [facility]

## 2015-07-03 NOTE — Telephone Encounter (Signed)
Pt wife calling stating pt is not sure if it is anxiety.  Pt is not sure if stress or not, asked them why have they not called for ED  She thinks it would be easier to come to "someone they know"  tingling   in leg, called PCP but they are closed already.  Is having headaches. But again not sure where to take him, they just want him to be check out.

## 2015-07-03 NOTE — Telephone Encounter (Signed)
I agree with advisement to go to UC - it does not sound like he can wait for Sat clinic

## 2015-07-04 NOTE — Telephone Encounter (Signed)
Please check on him on Monday 

## 2015-07-06 NOTE — Telephone Encounter (Signed)
Spoke with patient and he did not go to UC, he states that he got better. Pt is now asking if he can have another colonoscopy? Per pt he's having rectal bleeding, I advised he is not due for another colonoscopy, per pt the first colonoscopy was free and now he has insurance. I advised that from his last colonoscopy it was suggested if he has rectal bleeding again that they would try suppositories or cream. I advised pt to call GI to set up appointment.

## 2015-07-07 MED ORDER — HYDROCORTISONE 2.5 % EX CREA: TOPICAL_CREAM | Freq: Three times a day (TID) | CUTANEOUS | Status: DC | PRN

## 2015-07-07 NOTE — Telephone Encounter (Signed)
Please call him to reassure that another colonoscopy is not needed. I will send Rx for 2.5% hydrocortisone cream  to use tid --to calm down the irritation that was seen on the colonoscopy and is probably causing any bleeding he is having.  It is okay for him to go in to GI to be reevaluated---but he should stop worrying about something serious (since colonoscopy only 3 years ago)

## 2015-07-07 NOTE — Telephone Encounter (Signed)
Left detailed message on VM that identified him, advised pt to call back if he has any questions

## 2016-02-01 ENCOUNTER — Telehealth: Payer: Self-pay

## 2016-02-01 NOTE — Telephone Encounter (Signed)
Pt has appt with Dr Silvio Pate 02/02/16 at 8:45.

## 2016-02-01 NOTE — Telephone Encounter (Signed)
PLEASE NOTE: All timestamps contained within this report are represented as [place] Standard Time. CONFIDENTIALTY NOTICE: This fax transmission is intended only for the addressee. It contains information that is legally privileged, confidential or otherwise protected from use or disclosure. If you are not the intended recipient, you are strictly prohibited from reviewing, disclosing, copying using or disseminating any of this information or taking any action in reliance on or regarding this information. If you have received this fax in error, please notify us immediately by telephone so that we can arrange for its return to [place]. Phone: [phone], Toll-Free: [phone], Fax: [phone] Page: 1 of 2 Call Id: [id number] Terrell Patient Name: [name] Gender: Male DOB: 03-Mar-1971 Age: 45 Y 10 M 11 D Return Phone Number: [phone] (Primary) Address: City/State/Zip:  Client [place] Night - Client Client Site [employer] Physician Viviana Simpler Contact Type Call Who Is Calling Patient / Member / Family / Caregiver Call Type Triage / Clinical Caller Name Saaid Relationship To Patient Self Return Phone Number [phone] (Primary) Chief Complaint Post-op Incision Symptoms Reason for Call Symptomatic / Request for [place] states he had previous foot surgery - causing swelling and pain. Also a rash on the front part of leg. Wants to be seen today. PreDisposition Call Doctor Translation No Nurse Assessment Nurse: Mills-Hernandez, RN, Izora Gala Date/Time (Eastern Time): 02/01/2016 3:22:13 PM Confirm and document reason for call. If symptomatic, describe symptoms. You must click the next button to save text entered. ---Caller states he had previous foot surgery on Dec 19th - causing swelling and  pain. Also a rash on the front part of leg. Wants to be seen today. Has the patient traveled out of the country within the last 30 days? ---No Does the patient have any new or worsening symptoms? ---Yes Will a triage be completed? ---Yes Related visit to physician within the last 2 weeks? ---Yes Does the PT have any chronic conditions? (i.e. diabetes, asthma, etc.) ---No Is this a behavioral health or substance abuse call? ---No Guidelines Guideline Title Affirmed Question Affirmed Notes Nurse Date/Time Eilene Ghazi Time) Post-Op Incision Symptoms [1] Widespread rash AND [2] bright red, sunburnlike Mills-Hernandez, RN, Izora Gala 02/01/2016 3:25:51 PM Disp. Time Eilene Ghazi Time) Disposition Final User 02/01/2016 3:34:51 PM Go to ED Now Yes Mills-Hernandez, RN, Izora Gala PLEASE NOTE: All timestamps contained within this report are represented as [place] Standard Time. CONFIDENTIALTY NOTICE: This fax transmission is intended only for the addressee. It contains information that is legally privileged, confidential or otherwise protected from use or disclosure. If you are not the intended recipient, you are strictly prohibited from reviewing, disclosing, copying using or disseminating any of this information or taking any action in reliance on or regarding this information. If you have received this fax in error, please notify us immediately by telephone so that we can arrange for its return to [place]. Phone: [phone], Toll-Free: [phone], Fax: [phone] Page: 2 of 2 Call Id: [id number] Caller Understands: Yes Disagree/Comply: Disagree Disagree/Comply Reason: Disagree with instructions Care Advice Given Per Guideline GO TO ED NOW: You need to be seen in the Emergency Department. Go to the ER at ___________ [place] now. Drive carefully. BRING MEDICINES: * Please bring a list of your current medicines when you go to the Emergency Department (ER). DRIVING: Another adult should drive. CARE  ADVICE per Post-Op Incision Symptoms (Adult) guideline. Comments  User: Haroldine Laws, RN Date/Time Eilene Ghazi Time): 02/01/2016 3:34:49 PM Caller states that he is not willilng to go to ER. He just wants to see his doctor. Nurse did call the office because we got the call during their regular office hrs and the lady who answered the phone stated that they do not have any more appointment available for today so they probably send him to [place] to triage. Caller stated to nurse that he was just going to go to the office and see if he could speak with doctor. Referrals GO TO FACILITY REFUSED

## 2016-02-01 NOTE — Telephone Encounter (Signed)
Unfortunately I was overbooked Will check him in the morning

## 2016-02-02 ENCOUNTER — Telehealth: Payer: Self-pay | Admitting: Internal Medicine

## 2016-02-02 ENCOUNTER — Ambulatory Visit: Payer: BLUE CROSS/BLUE SHIELD | Admitting: Internal Medicine

## 2016-02-02 NOTE — Telephone Encounter (Signed)
Patient did not come for their scheduled appointment today for leg pain Please let me know if the patient needs to be contacted immediately for follow up or if no follow up is necessary.

## 2016-02-02 NOTE — Telephone Encounter (Signed)
Called to check on pt and to see why he missed his appt. .left message to have patient return my call.

## 2016-02-03 NOTE — Telephone Encounter (Signed)
He only needs to come in prn. No charge for the no show please

## 2016-02-06 ENCOUNTER — Encounter (HOSPITAL_COMMUNITY): Payer: Self-pay

## 2016-02-06 ENCOUNTER — Emergency Department (HOSPITAL_COMMUNITY)
Admission: EM | Admit: 2016-02-06 | Discharge: 2016-02-06 | Disposition: A | Discharge status: Home / Self Care | Payer: BLUE CROSS/BLUE SHIELD | Attending: Emergency Medicine | Admitting: Emergency Medicine

## 2016-02-06 DIAGNOSIS — M66872 Spontaneous rupture of other tendons, left ankle and foot: Secondary | ICD-10-CM | POA: Diagnosis not present

## 2016-02-06 DIAGNOSIS — Z8679 Personal history of other diseases of the circulatory system: Secondary | ICD-10-CM | POA: Insufficient documentation

## 2016-02-06 DIAGNOSIS — Z8639 Personal history of other endocrine, nutritional and metabolic disease: Secondary | ICD-10-CM | POA: Diagnosis not present

## 2016-02-06 DIAGNOSIS — S86012A Strain of left Achilles tendon, initial encounter: Secondary | ICD-10-CM

## 2016-02-06 DIAGNOSIS — M25572 Pain in left ankle and joints of left foot: Secondary | ICD-10-CM | POA: Diagnosis present

## 2016-02-06 DIAGNOSIS — Z9889 Other specified postprocedural states: Secondary | ICD-10-CM | POA: Insufficient documentation

## 2016-02-06 DIAGNOSIS — Z7952 Long term (current) use of systemic steroids: Secondary | ICD-10-CM | POA: Diagnosis not present

## 2016-02-06 DIAGNOSIS — R011 Cardiac murmur, unspecified: Secondary | ICD-10-CM | POA: Diagnosis not present

## 2016-02-06 DIAGNOSIS — Z8719 Personal history of other diseases of the digestive system: Secondary | ICD-10-CM | POA: Diagnosis not present

## 2016-02-06 NOTE — ED Notes (Signed)
Repaged [facility](Dr. Dorna Leitz) to Dr. Winfred Leeds @ [phone]

## 2016-02-06 NOTE — ED Notes (Signed)
Per Dr. Joanie Coddington request, RN requested that the sec. Page Eulogio Bear for the third time, which was done.

## 2016-02-06 NOTE — Progress Notes (Signed)
Orthopedic Tech Progress Note Patient Details:  [name] May 28, 1971 [id number] Applied fiberglass posterior short leg splint to LLE.  Positioned foot in plantar flexion as ordered.  Pulses, sensation, motion intact before and after splinting.  Capillary refill less than 2 seconds before and after splinting. Ortho Devices Type of Ortho Device: Post (short leg) splint Ortho Device/Splint Location: LLE Ortho Device/Splint Interventions: Application   Darrol Poke 02/06/2016, 8:39 PM

## 2016-02-06 NOTE — ED Provider Notes (Signed)
CSN: [id number]     Arrival date & time 02/06/16  1742 History   First MD Initiated Contact with Patient 02/06/16 1906     Chief Complaint  Patient presents with  . Post-op Problem     (Consider location/radiation/quality/duration/timing/severity/associated sxs/prior Treatment) HPI She complains of pain at left posterior ankle onset yesterday after he forcibly dorsiflexed his ankle while walking. Patient is status post Achilles tendon repair 11/29/2015. He is afraid that he may have re-torn his Achilles tendon left side. He complains of pain at posterior ankle only with weightbearing no pain with nonweightbearing other associated symptoms include swelling at left ankle.. No other associated symptoms Past Medical History  Diagnosis Date  . Rectal bleeding     polyp removed 01/19/2012 pre canerous  . Complication of anesthesia 1996    ran a fever after sinus surgery  . Hyperlipidemia   . Heart murmur   . Mitral valve prolapse    Past Surgical History  Procedure Laterality Date  . Nasal turbinate reduction  1996  . Lasik  2006    bilateral  . Colonoscopy    . Chondroplasty  02/27/2012    Procedure: CHONDROPLASTY;  Surgeon: Alta Corning, MD;  Location: [facility];  Service: Orthopedics;  Laterality: Right;  patella-femoral joint  . Knee arthroscopy Left 3/16   Family History  Problem Relation Age of Onset  . Multiple sclerosis Mother   . Anesthesia problems Mother   . Hypertension Mother   . Asthma      children; seasonal  . Colon cancer Neg Hx   . Colitis Father     questionable  . Arthritis Son     juvinile rheumatoid   Social History  Substance Use Topics  . Smoking status: Never Smoker   . Smokeless tobacco: Never Used  . Alcohol Use: Yes     Comment: occasional beer/wine    Review of Systems  Constitutional: Negative.   Musculoskeletal: Positive for arthralgias.      Allergies  Review of patient's allergies indicates no known allergies.  Home Medications    Prior to Admission medications   Medication Sig Start Date End Date Taking? Authorizing Provider  hydrocortisone 2.5 % cream Apply topically 3 (three) times daily as needed. 07/07/15   Venia Carbon, MD   BP 135/87 mmHg  Pulse 93  Temp(Src) 97.2 F (36.2 C) (Oral)  Resp 22  SpO2 96% Physical Exam  Constitutional: He appears well-developed and well-nourished. No distress.  HENT:  Head: Normocephalic and atraumatic.  Eyes: EOM are normal.  Neck: Neck supple. No tracheal deviation present.  Cardiovascular: Normal rate.   Pulmonary/Chest: Effort normal.  Abdominal:  Obese  Musculoskeletal: Normal range of motion.  Left lower sternum minimally reddened over shin. Surgical scar overlying Achilles tendon. Positive Thompson's test. DP pulse 2+. Good capillary refill. No cords or Homans sign tenderness or thigh tenderness to all other extremities without redness or tenderness neurovascularly intact  Neurological: He is alert. Coordination normal.  Skin: Skin is warm and dry. No rash noted.  Psychiatric: He has a normal mood and affect.  Nursing note and vitals reviewed.   ED Course  Procedures (including critical care time) Labs Review Labs Reviewed - No data to display  Imaging Review No results found. I have personally reviewed and evaluated these images and lab results as part of my medical decision-making.   EKG Interpretation None     Posterior short leg splint placed on patient by orthopedic technician with plantar  flexion. Comfortable for patient. X-ray of left ankle ordered. Patient inclined x-ray and x-ray canceled her declines pain medication in the ED. Patient hasmobic if needed MDM  I'm in agreement the patient does not require x-ray of left ankle Exam is consistent with Achilles tendon rupture and no evidence of infection or phlebitis Final diagnoses:  None  Case discussed with Dr.Thiompson LAN patient is to see Dr. Rip Harbour at [facility] in 2  days Diagnosis left Achilles tendon rupture      Orlie Dakin, MD 02/06/16 2047

## 2016-02-06 NOTE — Discharge Instructions (Signed)
Keep your scheduled appointment with Dr. Rip Harbour in 2 days. You can take your mobile, or other prescribed pain medicine as needed.

## 2016-02-06 NOTE — ED Notes (Signed)
Pt had bone spur and had surgery on left foot on December 19. He has been wearing a cam walker boot since then. He has been noticing swelling and redness to ankle and lower leg. His wife is concerned about a possible blood clot or infection. He called his ortho MD and was told to refrain from wearing the cam walker. He called ortho again today and was told to come to ED.

## 2016-02-06 NOTE — ED Notes (Signed)
[facility] confirmed Dr. Grandville Silos is on call instead of Dr. Berenice Primas.

## 2016-02-06 NOTE — ED Notes (Signed)
Repaged [facility] to Dr. Winfred Leeds, for the 3rd time.

## 2016-02-18 ENCOUNTER — Encounter: Payer: Self-pay | Admitting: Internal Medicine

## 2016-02-18 ENCOUNTER — Ambulatory Visit (INDEPENDENT_AMBULATORY_CARE_PROVIDER_SITE_OTHER): Payer: BLUE CROSS/BLUE SHIELD | Admitting: Internal Medicine

## 2016-02-18 ENCOUNTER — Ambulatory Visit (HOSPITAL_COMMUNITY)
Admission: RE | Admit: 2016-02-18 | Discharge: 2016-02-18 | Disposition: A | Discharge status: Home / Self Care | Payer: BLUE CROSS/BLUE SHIELD | Source: Ambulatory Visit | Attending: Orthopedic Surgery | Admitting: Orthopedic Surgery

## 2016-02-18 ENCOUNTER — Other Ambulatory Visit (HOSPITAL_COMMUNITY): Payer: Self-pay | Admitting: Orthopedic Surgery

## 2016-02-18 VITALS — BP 110/70 | HR 100 | Temp 98.2°F | Wt 318.0 lb

## 2016-02-18 DIAGNOSIS — M79605 Pain in left leg: Secondary | ICD-10-CM | POA: Diagnosis not present

## 2016-02-18 DIAGNOSIS — I8312 Varicose veins of left lower extremity with inflammation: Secondary | ICD-10-CM | POA: Diagnosis not present

## 2016-02-18 DIAGNOSIS — J0110 Acute frontal sinusitis, unspecified: Secondary | ICD-10-CM | POA: Diagnosis not present

## 2016-02-18 DIAGNOSIS — I872 Venous insufficiency (chronic) (peripheral): Secondary | ICD-10-CM

## 2016-02-18 DIAGNOSIS — M7989 Other specified soft tissue disorders: Secondary | ICD-10-CM

## 2016-02-18 DIAGNOSIS — M79662 Pain in left lower leg: Secondary | ICD-10-CM

## 2016-02-18 NOTE — Progress Notes (Signed)
*  PRELIMINARY RESULTS* Vascular Ultrasound Left lower extremity venous duplex has been completed.  Preliminary findings: No evidence of DVT or baker's cyst.  Called results to [facility].   Landry Mellow, RDMS, RVT  02/18/2016, 12:02 PM

## 2016-02-18 NOTE — Progress Notes (Signed)
   Subjective:    Patient ID: [name], male    DOB: 08-20-1971, 45 y.o.   MRN: [id number]  HPI Here due to respiratory symptoms and left leg rash  Saw orthopedist today Rash on left calf---had duplex negative for DVT Rx for cephalexin given Has been going on for some time Foot still swollen and some redness for weeks Had x-ray 2 weeks ago after acute strain--fortunately had not retorn the Achilles  Sick with respiratory illness  Started about 1 week ago Entire family sick No fever--but had cold chill today Frontal pressure Ears feel pressure No cough---just congested No SOB--but gets tight feeling in chest  Taking allergy/decongestant med--some help (zyrtec D) No recent NSAIDs----mobic up till 3-4 days ago  Current Outpatient Prescriptions on File Prior to Visit  Medication Sig Dispense Refill  . meloxicam (MOBIC) 15 MG tablet Take 15 mg by mouth daily.  1   No current facility-administered medications on file prior to visit.    No Known Allergies  Past Medical History  Diagnosis Date  . Rectal bleeding     polyp removed 01/19/2012 pre canerous  . Complication of anesthesia 1996    ran a fever after sinus surgery  . Hyperlipidemia   . Heart murmur   . Mitral valve prolapse     Past Surgical History  Procedure Laterality Date  . Nasal turbinate reduction  1996  . Lasik  2006    bilateral  . Colonoscopy    . Chondroplasty  02/27/2012    Procedure: CHONDROPLASTY;  Surgeon: Alta Corning, MD;  Location: [facility];  Service: Orthopedics;  Laterality: Right;  patella-femoral joint  . Knee arthroscopy Left 3/16    Family History  Problem Relation Age of Onset  . Multiple sclerosis Mother   . Anesthesia problems Mother   . Hypertension Mother   . Asthma      children; seasonal  . Colon cancer Neg Hx   . Colitis Father     questionable  . Arthritis Son     juvinile rheumatoid    Social History   Social History  . Marital Status: Married    Spouse  Name: N/A  . Number of Children: N/A  . 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 Smoking status: Never Smoker   . Smokeless tobacco: Never Used  . Alcohol Use: Yes     Comment: occasional beer/wine  . Drug Use: No  . Sexual Activity: Not on file   Other Topics Concern  . Not on file   Social History Narrative   5 children and 3 step children   Review of Systems  No other rash No vomiting or diarrhea  Appetite is off some     Objective:   Physical Exam  Constitutional: He appears well-developed. No distress.  HENT:  No sinus tenderness Moderate nasal inflammation Slight pharyngeal injection--no exudates TMs normal   Neck: Normal range of motion. Neck supple.  Pulmonary/Chest: Effort normal and breath sounds normal. No respiratory distress. He has no wheezes. He has no rales.  Musculoskeletal:  2+ non pitting edema in left calf No tenderness   Lymphadenopathy:    He has no cervical adenopathy.  Skin:  Classic reddish brown stasis rash on left anterior calf Very faint on right calf          Assessment & Plan:

## 2016-02-18 NOTE — Assessment & Plan Note (Signed)
May still be viral but over a week Okay to use the cephalexin for this

## 2016-02-18 NOTE — Patient Instructions (Signed)
Stasis Dermatitis Stasis dermatitis occurs when veins lose the ability to pump blood back to the heart (poor venous circulation). It causes a reddish-purple to brownish scaly, itchy rash on the legs. The rash comes from pooling of blood (stasis). CAUSES  This occurs because the veins do not work very well anymore or because pressure may be increased in the veins due to other conditions. With blood pooling, the increased pressure in the tiny blood vessels (capillaries) causes fluid to leak out of the capillaries into the tissue. The extra fluid makes it harder for the blood to feed the cells and get rid of waste products. SYMPTOMS  Stasis dermatitis appears as red, scaly, itchy patches on the legs. A yellowish or light brown discoloration is also present. Due to scratching or other injury, these patches can become an ulcer. This ulcer may remain for long periods of time. The ulcer can also become infected. Swelling of the legs is often present with stasis dermatitis. If the leg is swollen, this increases the risk of infection and further damage to the skin. Sometimes, intense itching, tingling, and burning occurs before signs of stasis dermatitis appear. You may find yourself scratching the insides of your ankles or rubbing your ankles together before the rash appears. After healing, there are often brown spots on the affected skin. DIAGNOSIS  Your caregiver makes this diagnosis based on an exam. Other tests may be done to better understand the cause. TREATMENT  If underlying conditions are present, they must be treated. Some of these conditions are heart failure, thyroid problems, poor nutrition, and varicose veins.  Cortisone creams and ointments applied to the skin (topically) may be needed, as well as medicine to reduce swelling in the legs (diuretics).  Compression stockings or an elastic wrap may also be needed to reduce swelling.  If there is an infection, antibiotic medicines may also be  used. HOME CARE INSTRUCTIONS   Try to rest and raise (elevate) the affected leg above the level of the heart, if possible.  Burow's solution wet packs applied for 30 minutes, 3 times daily, will help the weepy rash. Stop using the packs before your skin gets too dry. You can also use a mixture of 3 parts white vinegar to 1 quart water.  Grease your legs daily with ointments, such as petroleum jelly, to fight dryness.  Avoid scratching or injuring the area. SEEK IMMEDIATE MEDICAL CARE IF:   Your rash gets worse.  An ulcer forms.  You have an oral temperature above 102 F (38.9 C), not controlled by medicine.  You have any other severe symptoms.   This information is not intended to replace advice given to you by your health care provider. Make sure you discuss any questions you have with your health care provider.   Document Released: 03/09/2006 Document Revised: 02/20/2012 Document Reviewed: 04/15/2015 Elsevier Interactive Patient Education [employer].

## 2016-02-18 NOTE — Assessment & Plan Note (Signed)
His calf doesn't have cellulitis Reassured him but also told him that this might not go away Encouraged regular movement, elevation and support hose for his legs

## 2016-03-28 ENCOUNTER — Ambulatory Visit (INDEPENDENT_AMBULATORY_CARE_PROVIDER_SITE_OTHER): Payer: BLUE CROSS/BLUE SHIELD | Admitting: Family Medicine

## 2016-03-28 ENCOUNTER — Encounter: Payer: Self-pay | Admitting: Family Medicine

## 2016-03-28 VITALS — BP 122/78 | HR 92 | Temp 98.1°F | Ht 76.0 in | Wt 299.5 lb

## 2016-03-28 DIAGNOSIS — J069 Acute upper respiratory infection, unspecified: Secondary | ICD-10-CM | POA: Insufficient documentation

## 2016-03-28 MED ORDER — AZITHROMYCIN 250 MG PO TABS: ORAL_TABLET | ORAL | Status: DC

## 2016-03-28 NOTE — Patient Instructions (Signed)
Great to see you. Take zpack directed.  Keep doing the supportive care that you are doing.

## 2016-03-28 NOTE — Progress Notes (Signed)
Pre visit review using our clinic review tool, if applicable. No additional management support is needed unless otherwise documented below in the visit note. 

## 2016-03-28 NOTE — Progress Notes (Signed)
Subjective:   Patient ID: [name], male    DOB: [birth year], 45 y.o.   MRN: [id number]  [name] is a pleasant 45 y.o. year old male pt of Dr. Silvio Pate, new to me, who presents to clinic today with Headache  on 03/28/2016  HPI:  Saw Dr. Silvio Pate on 02/18/16- note reviewed. Had URI symptoms for a week with associated frontal sinus pressure. Advised likely viral but ok to use keflex as well which was given to him by his orthopedist for ?leg rash. He did finish the keflex but stopped taking it because it did not seem to help with URI symptoms.  URI symptoms getting worse- cough is productive, he is breaking out in cold sweats. Sinus pressure is worsening.  Taking OTC decongestants, antihistamines and flonase without much improvement.  Current Outpatient Prescriptions on File Prior to Visit  Medication Sig Dispense Refill  . cephALEXin (KEFLEX) 500 MG capsule Take 500 mg by mouth. Reported on 03/28/2016     No current facility-administered medications on file prior to visit.    No Known Allergies  Past Medical History  Diagnosis Date  . Rectal bleeding     polyp removed 01/19/2012 pre canerous  . Complication of anesthesia 1996    ran a fever after sinus surgery  . Hyperlipidemia   . Heart murmur   . Mitral valve prolapse     Past Surgical History  Procedure Laterality Date  . Nasal turbinate reduction  1996  . Lasik  2006    bilateral  . Colonoscopy    . Chondroplasty  02/27/2012    Procedure: CHONDROPLASTY;  Surgeon: Alta Corning, MD;  Location: [facility];  Service: Orthopedics;  Laterality: Right;  patella-femoral joint  . Knee arthroscopy Left 3/16    Family History  Problem Relation Age of Onset  . Multiple sclerosis Mother   . Anesthesia problems Mother   . Hypertension Mother   . Asthma      children; seasonal  . Colon cancer Neg Hx   . Colitis Father     questionable  . Arthritis Son     juvinile rheumatoid    Social History   Social History  .  Marital Status: Married    Spouse Name: N/A  . Number of Children: N/A  . 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 Smoking status: Never Smoker   . Smokeless tobacco: Never Used  . Alcohol Use: 0.0 oz/week    0 Standard drinks or equivalent per week     Comment: occasional beer/wine  . Drug Use: No  . Sexual Activity: Not on file   Other Topics Concern  . Not on file   Social History Narrative   5 children and 3 step children   The PMH, PSH, Social History, Family History, Medications, and allergies have been reviewed in [facility], and have been updated if relevant.  Review of Systems  Constitutional: Positive for fever and fatigue.  HENT: Positive for congestion, postnasal drip, sinus pressure and sore throat.   Respiratory: Positive for cough and shortness of breath. Negative for wheezing and stridor.   Cardiovascular: Negative.   Gastrointestinal: Negative.   Genitourinary: Negative.   Musculoskeletal: Positive for myalgias.  Skin: Negative.   Allergic/Immunologic: Negative.   Neurological: Negative.   All other systems reviewed and are negative.      Objective:    BP 122/78 mmHg  Pulse  92  Temp(Src) 98.1 F (36.7 C) (Oral)  Ht 6\' 4"  (1.93 m)  Wt 299 lb 8 oz (135.852 kg)  BMI 36.47 kg/m2  SpO2 98%   Physical Exam  Constitutional: He is oriented to person, place, and time. He appears well-developed and well-nourished. No distress.  HENT:  Head: Normocephalic.  Right Ear: Hearing and tympanic membrane normal.  Left Ear: Hearing and tympanic membrane normal.  Nose: Mucosal edema and rhinorrhea present. Right sinus exhibits frontal sinus tenderness. Left sinus exhibits frontal sinus tenderness.  Mouth/Throat: Uvula is midline. Posterior oropharyngeal erythema present.  Cardiovascular: Normal rate.   Pulmonary/Chest: Effort normal. He has decreased breath sounds in the left lower field. He has  no wheezes. He has rhonchi in the right lower field and the left lower field.  Neurological: He is alert and oriented to person, place, and time. No cranial nerve deficit.  Skin: Skin is warm and dry. He is not diaphoretic.  Psychiatric: He has a normal mood and affect. His behavior is normal. Judgment and thought content normal.  Nursing note and vitals reviewed.         Assessment & Plan:   Acute upper respiratory infection No Follow-up on file.

## 2016-03-28 NOTE — Assessment & Plan Note (Signed)
Deteriorated.  Concern for CAP. Treat with zpack. Continue supportive care as he has been doing at home.  Declined cough suppressant rx. Call or return to clinic prn if these symptoms worsen or fail to improve as anticipated. The patient indicates understanding of these issues and agrees with the plan.

## 2016-04-04 ENCOUNTER — Encounter: Payer: Self-pay | Admitting: Internal Medicine

## 2016-04-05 ENCOUNTER — Encounter: Payer: Self-pay | Admitting: Internal Medicine

## 2016-04-06 MED ORDER — OSELTAMIVIR PHOSPHATE 75 MG PO CAPS: 75.0000 mg | ORAL_CAPSULE | Freq: Every day | ORAL | Status: DC

## 2016-04-08 ENCOUNTER — Ambulatory Visit: Payer: BLUE CROSS/BLUE SHIELD | Admitting: Internal Medicine

## 2016-04-14 ENCOUNTER — Encounter: Payer: Self-pay | Admitting: Internal Medicine

## 2016-04-14 ENCOUNTER — Ambulatory Visit
Admission: RE | Admit: 2016-04-14 | Discharge: 2016-04-14 | Disposition: A | Discharge status: Home / Self Care | Payer: BLUE CROSS/BLUE SHIELD | Source: Ambulatory Visit | Attending: Internal Medicine | Admitting: Internal Medicine

## 2016-04-14 ENCOUNTER — Ambulatory Visit (INDEPENDENT_AMBULATORY_CARE_PROVIDER_SITE_OTHER): Payer: BLUE CROSS/BLUE SHIELD | Admitting: Internal Medicine

## 2016-04-14 DIAGNOSIS — F0781 Postconcussional syndrome: Secondary | ICD-10-CM | POA: Insufficient documentation

## 2016-04-14 DIAGNOSIS — J328 Other chronic sinusitis: Secondary | ICD-10-CM | POA: Insufficient documentation

## 2016-04-14 DIAGNOSIS — M5020 Other cervical disc displacement, unspecified cervical region: Secondary | ICD-10-CM | POA: Diagnosis not present

## 2016-04-14 DIAGNOSIS — R519 Headache, unspecified: Secondary | ICD-10-CM

## 2016-04-14 DIAGNOSIS — R41840 Attention and concentration deficit: Secondary | ICD-10-CM

## 2016-04-14 DIAGNOSIS — R51 Headache: Secondary | ICD-10-CM | POA: Diagnosis present

## 2016-04-14 DIAGNOSIS — R413 Other amnesia: Secondary | ICD-10-CM

## 2016-04-14 DIAGNOSIS — R6889 Other general symptoms and signs: Secondary | ICD-10-CM

## 2016-04-14 DIAGNOSIS — R42 Dizziness and giddiness: Secondary | ICD-10-CM

## 2016-04-14 NOTE — Patient Instructions (Signed)
Concussion, Adult  A concussion, or closed-head injury, is a brain injury caused by a direct blow to the head or by a quick and sudden movement (jolt) of the head or neck. Concussions are usually not life-threatening. Even so, the effects of a concussion can be serious. If you have had a concussion before, you are more likely to experience concussion-like symptoms after a direct blow to the head.   CAUSES  · Direct blow to the head, such as from running into another player during a soccer game, being hit in a fight, or hitting your head on a hard surface.  · A jolt of the head or neck that causes the brain to move back and forth inside the skull, such as in a car crash.  SIGNS AND SYMPTOMS  The signs of a concussion can be hard to notice. Early on, they may be missed by you, family members, and health care providers. You may look fine but act or feel differently.  Symptoms are usually temporary, but they may last for days, weeks, or even longer. Some symptoms may appear right away while others may not show up for hours or days. Every head injury is different. Symptoms include:  · Mild to moderate headaches that will not go away.  · A feeling of pressure inside your head.  · Having more trouble than usual:    Learning or remembering things you have heard.    Answering questions.    Paying attention or concentrating.    Organizing daily tasks.    Making decisions and solving problems.  · Slowness in thinking, acting or reacting, speaking, or reading.  · Getting lost or being easily confused.  · Feeling tired all the time or lacking energy (fatigued).  · Feeling drowsy.  · Sleep disturbances.    Sleeping more than usual.    Sleeping less than usual.    Trouble falling asleep.    Trouble sleeping (insomnia).  · Loss of balance or feeling lightheaded or dizzy.  · Nausea or vomiting.  · Numbness or tingling.  · Increased sensitivity to:    Sounds.    Lights.    Distractions.  · Vision problems or eyes that tire  easily.  · Diminished sense of taste or smell.  · Ringing in the ears.  · Mood changes such as feeling sad or anxious.  · Becoming easily irritated or angry for little or no reason.  · Lack of motivation.  · Seeing or hearing things other people do not see or hear (hallucinations).  DIAGNOSIS  Your health care provider can usually diagnose a concussion based on a description of your injury and symptoms. He or she will ask whether you passed out (lost consciousness) and whether you are having trouble remembering events that happened right before and during your injury.  Your evaluation might include:  · A brain scan to look for signs of injury to the brain. Even if the test shows no injury, you may still have a concussion.  · Blood tests to be sure other problems are not present.  TREATMENT  · Concussions are usually treated in an emergency department, in urgent care, or at a clinic. You may need to stay in the hospital overnight for further treatment.  · Tell your health care provider if you are taking any medicines, including prescription medicines, over-the-counter medicines, and natural remedies. Some medicines, such as blood thinners (anticoagulants) and aspirin, may increase the chance of complications. Also tell your health care   provider whether you have had alcohol or are taking illegal drugs. This information may affect treatment.  · Your health care provider will send you home with important instructions to follow.  · How fast you will recover from a concussion depends on many factors. These factors include how severe your concussion is, what part of your brain was injured, your age, and how healthy you were before the concussion.  · Most people with mild injuries recover fully. Recovery can take time. In general, recovery is slower in older persons. Also, persons who have had a concussion in the past or have other medical problems may find that it takes longer to recover from their current injury.  HOME  CARE INSTRUCTIONS  General Instructions  · Carefully follow the directions your health care provider gave you.  · Only take over-the-counter or prescription medicines for pain, discomfort, or fever as directed by your health care provider.  · Take only those medicines that your health care provider has approved.  · Do not drink alcohol until your health care provider says you are well enough to do so. Alcohol and certain other drugs may slow your recovery and can put you at risk of further injury.  · If it is harder than usual to remember things, write them down.  · If you are easily distracted, try to do one thing at a time. For example, do not try to watch TV while fixing dinner.  · Talk with family members or close friends when making important decisions.  · Keep all follow-up appointments. Repeated evaluation of your symptoms is recommended for your recovery.  · Watch your symptoms and tell others to do the same. Complications sometimes occur after a concussion. Older adults with a brain injury may have a higher risk of serious complications, such as a blood clot on the brain.  · Tell your teachers, school nurse, school counselor, coach, athletic trainer, or work manager about your injury, symptoms, and restrictions. Tell them about what you can or cannot do. They should watch for:    Increased problems with attention or concentration.    Increased difficulty remembering or learning new information.    Increased time needed to complete tasks or assignments.    Increased irritability or decreased ability to cope with stress.    Increased symptoms.  · Rest. Rest helps the brain to heal. Make sure you:    Get plenty of sleep at night. Avoid staying up late at night.    Keep the same bedtime hours on weekends and weekdays.    Rest during the day. Take daytime naps or rest breaks when you feel tired.  · Limit activities that require a lot of thought or concentration. These include:    Doing homework or job-related  work.    Watching TV.    Working on the computer.  · Avoid any situation where there is potential for another head injury (football, hockey, soccer, basketball, martial arts, downhill snow sports and horseback riding). Your condition will get worse every time you experience a concussion. You should avoid these activities until you are evaluated by the appropriate follow-up health care providers.  Returning To Your Regular Activities  You will need to return to your normal activities slowly, not all at once. You must give your body and brain enough time for recovery.  · Do not return to sports or other athletic activities until your health care provider tells you it is safe to do so.  · Ask   your health care provider when you can drive, ride a bicycle, or operate heavy machinery. Your ability to react may be slower after a brain injury. Never do these activities if you are dizzy.  · Ask your health care provider about when you can return to work or school.  Preventing Another Concussion  It is very important to avoid another brain injury, especially before you have recovered. In rare cases, another injury can lead to permanent brain damage, brain swelling, or death. The risk of this is greatest during the first 7-10 days after a head injury. Avoid injuries by:  · Wearing a seat belt when riding in a car.  · Drinking alcohol only in moderation.  · Wearing a helmet when biking, skiing, skateboarding, skating, or doing similar activities.  · Avoiding activities that could lead to a second concussion, such as contact or recreational sports, until your health care provider says it is okay.  · Taking safety measures in your home.    Remove clutter and tripping hazards from floors and stairways.    Use grab bars in bathrooms and handrails by stairs.    Place non-slip mats on floors and in bathtubs.    Improve lighting in dim areas.  SEEK MEDICAL CARE IF:  · You have increased problems paying attention or  concentrating.  · You have increased difficulty remembering or learning new information.  · You need more time to complete tasks or assignments than before.  · You have increased irritability or decreased ability to cope with stress.  · You have more symptoms than before.  Seek medical care if you have any of the following symptoms for more than 2 weeks after your injury:  · Lasting (chronic) headaches.  · Dizziness or balance problems.  · Nausea.  · Vision problems.  · Increased sensitivity to noise or light.  · Depression or mood swings.  · Anxiety or irritability.  · Memory problems.  · Difficulty concentrating or paying attention.  · Sleep problems.  · Feeling tired all the time.  SEEK IMMEDIATE MEDICAL CARE IF:  · You have severe or worsening headaches. These may be a sign of a blood clot in the brain.  · You have weakness (even if only in one hand, leg, or part of the face).  · You have numbness.  · You have decreased coordination.  · You vomit repeatedly.  · You have increased sleepiness.  · One pupil is larger than the other.  · You have convulsions.  · You have slurred speech.  · You have increased confusion. This may be a sign of a blood clot in the brain.  · You have increased restlessness, agitation, or irritability.  · You are unable to recognize people or places.  · You have neck pain.  · It is difficult to wake you up.  · You have unusual behavior changes.  · You lose consciousness.  MAKE SURE YOU:  · Understand these instructions.  · Will watch your condition.  · Will get help right away if you are not doing well or get worse.     This information is not intended to replace advice given to you by your health care provider. Make sure you discuss any questions you have with your health care provider.     Document Released: 02/18/2004 Document Revised: 12/19/2014 Document Reviewed: 06/20/2013  Elsevier Interactive Patient Education ©2016 [employer] Inc[employer]
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
## 2016-04-14 NOTE — Progress Notes (Signed)
Pre visit review using our clinic review tool, if applicable. No additional management support is needed unless otherwise documented below in the visit note. 

## 2016-04-15 ENCOUNTER — Other Ambulatory Visit: Payer: Self-pay | Admitting: Internal Medicine

## 2016-04-15 ENCOUNTER — Encounter: Payer: Self-pay | Admitting: Internal Medicine

## 2016-04-15 MED ORDER — AMOXICILLIN-POT CLAVULANATE 875-125 MG PO TABS: 1.0000 | ORAL_TABLET | Freq: Two times a day (BID) | ORAL | Status: DC

## 2016-04-19 ENCOUNTER — Encounter: Payer: Self-pay | Admitting: Internal Medicine

## 2016-04-22 ENCOUNTER — Encounter: Payer: Self-pay | Admitting: Internal Medicine

## 2016-04-22 ENCOUNTER — Ambulatory Visit (INDEPENDENT_AMBULATORY_CARE_PROVIDER_SITE_OTHER): Payer: BLUE CROSS/BLUE SHIELD | Admitting: Internal Medicine

## 2016-04-22 VITALS — BP 124/90 | HR 65 | Temp 97.5°F | Wt 315.0 lb

## 2016-04-22 DIAGNOSIS — S060X0D Concussion without loss of consciousness, subsequent encounter: Secondary | ICD-10-CM

## 2016-04-22 DIAGNOSIS — S060X0A Concussion without loss of consciousness, initial encounter: Secondary | ICD-10-CM | POA: Insufficient documentation
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
## 2016-04-22 NOTE — Progress Notes (Signed)
Pre visit review using our clinic review tool, if applicable. No additional management support is needed unless otherwise documented below in the visit note. 

## 2016-04-22 NOTE — Assessment & Plan Note (Signed)
Mild concentration problems Neck and flank are also benign Reassured---all should fade over time

## 2016-05-03 ENCOUNTER — Encounter: Payer: Self-pay | Admitting: Internal Medicine

## 2016-05-03 ENCOUNTER — Ambulatory Visit (INDEPENDENT_AMBULATORY_CARE_PROVIDER_SITE_OTHER): Payer: BLUE CROSS/BLUE SHIELD | Admitting: Internal Medicine

## 2016-05-03 VITALS — BP 108/70 | HR 68 | Temp 97.8°F | Ht 74.0 in | Wt 312.0 lb

## 2016-05-03 DIAGNOSIS — Z0000 Encounter for general adult medical examination without abnormal findings: Secondary | ICD-10-CM | POA: Diagnosis not present

## 2016-05-03 DIAGNOSIS — E785 Hyperlipidemia, unspecified: Secondary | ICD-10-CM

## 2016-05-03 LAB — LIPID PANEL
CHOL/HDL RATIO: 6
Cholesterol: 199 mg/dL (ref 0–200)
HDL: 31.9 mg/dL — ABNORMAL LOW (ref >39.00)
LDL CALC: 140 mg/dL — AB (ref 0–99)
NONHDL: 167.26
Triglycerides: 136 mg/dL (ref 0.0–149.0)
VLDL: 27.2 mg/dL (ref 0.0–40.0)

## 2016-05-03 LAB — COMPREHENSIVE METABOLIC PANEL
ALT: 70 U/L — AB (ref 0–53)
AST: 34 U/L (ref 0–37)
Albumin: 4.4 g/dL (ref 3.5–5.2)
Alkaline Phosphatase: 44 U/L (ref 39–117)
BUN: 17 mg/dL (ref 6–23)
CALCIUM: 9.6 mg/dL (ref 8.4–10.5)
CO2: 29 mEq/L (ref 19–32)
Chloride: 105 mEq/L (ref 96–112)
Creatinine, Ser: 1.04 mg/dL (ref 0.40–1.50)
GFR: 82.8 mL/min (ref >60.00)
Glucose, Bld: 97 mg/dL (ref 70–99)
Potassium: 4.3 mEq/L (ref 3.5–5.1)
Sodium: 140 mEq/L (ref 135–145)
Total Bilirubin: 0.7 mg/dL (ref 0.2–1.2)
Total Protein: 7.1 g/dL (ref 6.0–8.3)

## 2016-05-03 LAB — CBC WITH DIFFERENTIAL/PLATELET
BASOS PCT: 0.7 % (ref 0.0–3.0)
Basophils Absolute: 0 10*3/uL (ref 0.0–0.1)
EOS PCT: 2.6 % (ref 0.0–5.0)
Eosinophils Absolute: 0.2 10*3/uL (ref 0.0–0.7)
HEMATOCRIT: 44.7 % (ref 39.0–52.0)
HEMOGLOBIN: 15.1 g/dL (ref 13.0–17.0)
LYMPHS PCT: 40.4 % (ref 12.0–46.0)
Lymphs Abs: 2.7 10*3/uL (ref 0.7–4.0)
MCHC: 33.9 g/dL (ref 30.0–36.0)
MCV: 90.1 fl (ref 78.0–100.0)
MONO ABS: 0.5 10*3/uL (ref 0.1–1.0)
Monocytes Relative: 7.9 % (ref 3.0–12.0)
Neutro Abs: 3.2 10*3/uL (ref 1.4–7.7)
Neutrophils Relative %: 48.4 % (ref 43.0–77.0)
Platelets: 247 10*3/uL (ref 150.0–400.0)
RBC: 4.96 Mil/uL (ref 4.22–5.81)
RDW: 13.6 % (ref 11.5–15.5)
WBC: 6.6 10*3/uL (ref 4.0–10.5)

## 2016-05-03 NOTE — Patient Instructions (Signed)
DASH Eating Plan  DASH stands for "Dietary Approaches to Stop Hypertension." The DASH eating plan is a healthy eating plan that has been shown to reduce high blood pressure (hypertension). Additional health benefits may include reducing the risk of type 2 diabetes mellitus, heart disease, and stroke. The DASH eating plan may also help with weight loss.  WHAT DO I NEED TO KNOW ABOUT THE DASH EATING PLAN?  For the DASH eating plan, you will follow these general guidelines:  · Choose foods with a percent daily value for sodium of less than 5% (as listed on the food label).  · Use salt-free seasonings or herbs instead of table salt or sea salt.  · Check with your health care provider or pharmacist before using salt substitutes.  · Eat lower-sodium products, often labeled as "lower sodium" or "no salt added."  · Eat fresh foods.  · Eat more vegetables, fruits, and low-fat dairy products.  · Choose whole grains. Look for the word "whole" as the first word in the ingredient list.  · Choose fish and skinless chicken or turkey more often than red meat. Limit fish, poultry, and meat to 6 oz (170 g) each day.  · Limit sweets, desserts, sugars, and sugary drinks.  · Choose heart-healthy fats.  · Limit cheese to 1 oz (28 g) per day.  · Eat more home-cooked food and less restaurant, buffet, and fast food.  · Limit fried foods.  · Cook foods using methods other than frying.  · Limit canned vegetables. If you do use them, rinse them well to decrease the sodium.  · When eating at a restaurant, ask that your food be prepared with less salt, or no salt if possible.  WHAT FOODS CAN I EAT?  Seek help from a dietitian for individual calorie needs.  Grains  Whole grain or whole wheat bread. Brown rice. Whole grain or whole wheat pasta. Quinoa, bulgur, and whole grain cereals. Low-sodium cereals. Corn or whole wheat flour tortillas. Whole grain cornbread. Whole grain crackers. Low-sodium crackers.  Vegetables  Fresh or frozen vegetables  (raw, steamed, roasted, or grilled). Low-sodium or reduced-sodium tomato and vegetable juices. Low-sodium or reduced-sodium tomato sauce and paste. Low-sodium or reduced-sodium canned vegetables.   Fruits  All fresh, canned (in natural juice), or frozen fruits.  Meat and Other Protein Products  Ground beef (85% or leaner), grass-fed beef, or beef trimmed of fat. Skinless chicken or turkey. Ground chicken or turkey. Pork trimmed of fat. All fish and seafood. Eggs. Dried beans, peas, or lentils. Unsalted nuts and seeds. Unsalted canned beans.  Dairy  Low-fat dairy products, such as skim or 1% milk, 2% or reduced-fat cheeses, low-fat ricotta or cottage cheese, or plain low-fat yogurt. Low-sodium or reduced-sodium cheeses.  Fats and Oils  Tub margarines without trans fats. Light or reduced-fat mayonnaise and salad dressings (reduced sodium). Avocado. Safflower, olive, or canola oils. Natural peanut or almond butter.  Other  Unsalted popcorn and pretzels.  The items listed above may not be a complete list of recommended foods or beverages. Contact your dietitian for more options.  WHAT FOODS ARE NOT RECOMMENDED?  Grains  White bread. White pasta. White rice. Refined cornbread. Bagels and croissants. Crackers that contain trans fat.  Vegetables  Creamed or fried vegetables. Vegetables in a cheese sauce. Regular canned vegetables. Regular canned tomato sauce and paste. Regular tomato and vegetable juices.  Fruits  Dried fruits. Canned fruit in light or heavy syrup. Fruit juice.  Meat and Other Protein   Products  Fatty cuts of meat. Ribs, chicken wings, bacon, sausage, bologna, salami, chitterlings, fatback, hot dogs, bratwurst, and packaged luncheon meats. Salted nuts and seeds. Canned beans with salt.  Dairy  Whole or 2% milk, cream, half-and-half, and cream cheese. Whole-fat or sweetened yogurt. Full-fat cheeses or blue cheese. Nondairy creamers and whipped toppings. Processed cheese, cheese spreads, or cheese  curds.  Condiments  Onion and garlic salt, seasoned salt, table salt, and sea salt. Canned and packaged gravies. Worcestershire sauce. Tartar sauce. Barbecue sauce. Teriyaki sauce. Soy sauce, including reduced sodium. Steak sauce. Fish sauce. Oyster sauce. Cocktail sauce. Horseradish. Ketchup and mustard. Meat flavorings and tenderizers. Bouillon cubes. Hot sauce. Tabasco sauce. Marinades. Taco seasonings. Relishes.  Fats and Oils  Butter, stick margarine, lard, shortening, ghee, and bacon fat. Coconut, palm kernel, or palm oils. Regular salad dressings.  Other  Pickles and olives. Salted popcorn and pretzels.  The items listed above may not be a complete list of foods and beverages to avoid. Contact your dietitian for more information.  WHERE CAN I FIND MORE INFORMATION?  National Heart, Lung, and [employer] Institute: www.nhlbi.nih.gov/health/health-topics/topics/dash/     This information is not intended to replace advice given to you by your health care provider. Make sure you discuss any questions you have with your health care provider.     Document Released: 11/17/2011 Document Revised: 12/19/2014 Document Reviewed: 10/02/2013  Elsevier Interactive Patient Education ©2016 [employer]

## 2016-05-03 NOTE — Assessment & Plan Note (Signed)
Healthy but needs to work on fitness Will check labs Discussed healthy eating and more frequent exercise Dermatologist for routine surveillance Colon due next year

## 2016-05-03 NOTE — Progress Notes (Signed)
Pre visit review using our clinic review tool, if applicable. No additional management support is needed unless otherwise documented below in the visit note. 
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
## 2016-05-03 NOTE — Assessment & Plan Note (Signed)
Was better last year Will check again this year

## 2016-05-10 ENCOUNTER — Encounter: Payer: Self-pay | Admitting: Podiatry

## 2016-05-10 ENCOUNTER — Ambulatory Visit (INDEPENDENT_AMBULATORY_CARE_PROVIDER_SITE_OTHER): Payer: BLUE CROSS/BLUE SHIELD | Admitting: Podiatry

## 2016-05-10 VITALS — BP 119/77 | HR 67 | Resp 18

## 2016-05-10 DIAGNOSIS — L600 Ingrowing nail: Secondary | ICD-10-CM | POA: Diagnosis not present

## 2016-05-10 MED ORDER — CEPHALEXIN 500 MG PO CAPS: 500.0000 mg | ORAL_CAPSULE | Freq: Three times a day (TID) | ORAL | Status: DC

## 2016-05-10 NOTE — Progress Notes (Signed)
Subjective:     Patient ID: [name], male   DOB: October 24, 1971, 45 y.o.   MRN: [id number]  HPI 45 year old Male presents the also concerns of ingrown toenails to his left second toe on his right big toe which is been ongoing for about 1 month cases and red and is in no drainage or pus in the areas are painful pressure in shoes. He is tried trim the toenail himself without any relief. Currently no pus. No with streaks. No other complaints.  Review of Systems  All other systems reviewed and are negative.      Objective:   Physical Exam General: AAO x3, NAD  Dermatological: There is incurvation along the right hallux lateral nail border left lateral second digit toenail with tenderness on patient. Physical was edema and erythema. Small amount serous drainage expressed from the left second toe. There is also mild incurvation as well as localized erythema to the medial third digit. There is no tenderness was there is no drainage or pus. He was unaware the toe is red.  Vascular: Dorsalis Pedis artery and Posterior Tibial artery pedal pulses are 2/4 bilateral with immedate capillary fill time. Pedal hair growth present. No varicosities and no lower extremity edema present bilateral. There is no pain with calf compression, swelling, warmth, erythema.   Neruologic: Grossly intact via light touch bilateral. Vibratory intact via tuning fork bilateral. Protective threshold with Semmes Wienstein monofilament intact to all pedal sites bilateral. Patellar and Achilles deep tendon reflexes 2+ bilateral. No Babinski or clonus noted bilateral.   Musculoskeletal: No gross boney pedal deformities bilateral. No pain, crepitus, or limitation noted with foot and ankle range of motion bilateral. Muscular strength 5/5 in all groups tested bilateral.  Gait: Unassisted, Nonantalgic.      Assessment:     45 year old male with ingrown toenail    Plan:     -Treatment options discussed including all  alternatives, risks, and complications -Etiology of symptoms were discussed At this time, the patient is requesting partial nail removal with chemical matricectomy to the symptomatic portion of the nail. Risks and complications were discussed with the patient for which they understand and  verbally consent to the procedure. Under sterile conditions a total of 3 mL of a mixture of 2% lidocaine plain and 0.5% Marcaine plain was infiltrated in a hallux block fashion. Once anesthetized, the skin was prepped in sterile fashion. A tourniquet was then applied. Next the right lateral hallux and lateral 2nd toe on the left aspect of hallux nail border was then sharply excised making sure to remove the entire offending nail border. Once the nails were ensured to be removed area was debrided and the underlying skin was intact. There is no purulence identified in the procedure. Next phenol was then applied under standard conditions and copiously irrigated. Silvadene was applied. A dry sterile dressing was applied. After application of the dressing the tourniquet was removed and there is found to be an immediate capillary refill time to the digit. The patient tolerated the procedure well any complications. Post procedure instructions were discussed the patient for which he verbally understood. Follow-up in one week for nail check or sooner if any problems are to arise. Discussed signs/symptoms of infection and directed to call the office immediately should any occur or go directly to the emergency room. In the meantime, encouraged to call the office with any questions, concerns, changes symptoms. -Left third digit nails debrided. If symptoms continue may need to have partial nail avulsion performed  this nails well.

## 2016-05-10 NOTE — Patient Instructions (Signed)

## 2016-05-17 ENCOUNTER — Ambulatory Visit: Payer: BLUE CROSS/BLUE SHIELD | Admitting: Podiatry

## 2016-05-26 ENCOUNTER — Ambulatory Visit: Payer: BLUE CROSS/BLUE SHIELD | Admitting: Podiatry

## 2016-08-17 ENCOUNTER — Encounter: Payer: Self-pay | Admitting: Internal Medicine

## 2016-08-17 ENCOUNTER — Telehealth: Payer: Self-pay | Admitting: Cardiovascular Disease

## 2016-08-17 ENCOUNTER — Ambulatory Visit (INDEPENDENT_AMBULATORY_CARE_PROVIDER_SITE_OTHER): Payer: BLUE CROSS/BLUE SHIELD | Admitting: Internal Medicine

## 2016-08-17 DIAGNOSIS — R0789 Other chest pain: Secondary | ICD-10-CM | POA: Diagnosis not present

## 2016-08-17 LAB — COMPREHENSIVE METABOLIC PANEL
ALT: 60 U/L — AB (ref 0–53)
AST: 27 U/L (ref 0–37)
Albumin: 4.3 g/dL (ref 3.5–5.2)
Alkaline Phosphatase: 48 U/L (ref 39–117)
BILIRUBIN TOTAL: 0.7 mg/dL (ref 0.2–1.2)
BUN: 19 mg/dL (ref 6–23)
CO2: 31 mEq/L (ref 19–32)
CREATININE: 0.91 mg/dL (ref 0.40–1.50)
Calcium: 9.2 mg/dL (ref 8.4–10.5)
Chloride: 103 mEq/L (ref 96–112)
GFR: 96.46 mL/min (ref >60.00)
GLUCOSE: 106 mg/dL — AB (ref 70–99)
Potassium: 4.1 mEq/L (ref 3.5–5.1)
Sodium: 139 mEq/L (ref 135–145)
Total Protein: 7.2 g/dL (ref 6.0–8.3)

## 2016-08-17 LAB — CBC WITH DIFFERENTIAL/PLATELET
BASOS PCT: 0.5 % (ref 0.0–3.0)
Basophils Absolute: 0 10*3/uL (ref 0.0–0.1)
EOS PCT: 2.7 % (ref 0.0–5.0)
Eosinophils Absolute: 0.2 10*3/uL (ref 0.0–0.7)
HCT: 41.9 % (ref 39.0–52.0)
Hemoglobin: 14.9 g/dL (ref 13.0–17.0)
LYMPHS ABS: 2.3 10*3/uL (ref 0.7–4.0)
Lymphocytes Relative: 41.3 % (ref 12.0–46.0)
MCHC: 35.5 g/dL (ref 30.0–36.0)
MCV: 92.2 fl (ref 78.0–100.0)
MONOS PCT: 7.7 % (ref 3.0–12.0)
Monocytes Absolute: 0.4 10*3/uL (ref 0.1–1.0)
NEUTROS ABS: 2.7 10*3/uL (ref 1.4–7.7)
NEUTROS PCT: 47.8 % (ref 43.0–77.0)
PLATELETS: 239 10*3/uL (ref 150.0–400.0)
RBC: 4.55 Mil/uL (ref 4.22–5.81)
RDW: 12.4 % (ref 11.5–15.5)
WBC: 5.6 10*3/uL (ref 4.0–10.5)

## 2016-08-17 NOTE — Patient Instructions (Signed)
Please try omeprazole 20mg  daily on an empty stomach (over the counter).

## 2016-08-17 NOTE — Progress Notes (Signed)
Subjective:    Patient ID: [name], male    DOB: 08/30/1971, 45 y.o.   MRN: [id number]  HPI He is here due to dizziness  Had to work extra hard last week--one of his workers was out Got dizzy, tight in chest and SOB Over 2-3 days--- all day long Tried to get some extra rest--but has to keep up with the work in [employer] Was awakening with headaches--then would ease off Noted nocturia and increased thirst every night--getting up ~4AM Feeling exhaused  Still feels some tightness in chest and "hardness to breath" Notices sweating a lot when he gets the symptoms  Current Outpatient Prescriptions on File Prior to Visit  Medication Sig Dispense Refill  . meloxicam (MOBIC) 15 MG tablet Take 15 mg by mouth daily as needed for pain.     No current facility-administered medications on file prior to visit.     No Known Allergies  Past Medical History:  Diagnosis Date  . Complication of anesthesia 1996   ran a fever after sinus surgery  . Heart murmur   . Hyperlipidemia   . Mitral valve prolapse   . Rectal bleeding    polyp removed 01/19/2012 pre canerous    Past Surgical History:  Procedure Laterality Date  . CHONDROPLASTY  02/27/2012   Procedure: CHONDROPLASTY;  Surgeon: Alta Corning, MD;  Location: [facility];  Service: Orthopedics;  Laterality: Right;  patella-femoral joint  . COLONOSCOPY    . KNEE ARTHROSCOPY Left 3/16  . LASIK  2006   bilateral  . NASAL TURBINATE REDUCTION  1996    Family History  Problem Relation Age of Onset  . Multiple sclerosis Mother   . Anesthesia problems Mother   . Hypertension Mother   . Asthma      children; seasonal  . Colon cancer Neg Hx   . Colitis Father     questionable  . Arthritis Son     juvinile rheumatoid    Social History   Social History  . Marital status: Married    Spouse name: N/A  . Number of children: N/A  . 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 Smoking status: Never Smoker  . Smokeless tobacco: Never Used  . Alcohol use 0.0 oz/week     Comment: occasional beer/wine  . Drug use: No  . Sexual activity: Not on file   Other Topics Concern  . Not on file   Social History Narrative   5 children and 3 step children   Review of Systems No heartburn or swallowing problems No daytime urinary symptoms Appetite is okay Weight stable Not getting any exercise    Objective:   Physical Exam  Constitutional: He appears well-developed and well-nourished. No distress.  Neck: Normal range of motion. Neck supple. No thyromegaly present.  Cardiovascular: Normal rate, regular rhythm, normal heart sounds and intact distal pulses.  Exam reveals no gallop.   No murmur heard. Pulmonary/Chest: Breath sounds normal. No respiratory distress. He has no wheezes. He has no rales.  No sternal or chest tenderness  Abdominal: Soft. He exhibits no distension. There is no rebound and no guarding.  ??slight epigastric tenderness?  Musculoskeletal: He exhibits no edema.  Lymphadenopathy:    He has no cervical adenopathy.  Psychiatric: He has a normal mood and affect. His behavior is normal.          Assessment &  Plan:

## 2016-08-17 NOTE — Telephone Encounter (Signed)
Spoke with patient and he states that he had chest pain and some stomach issues but he is not currently having any problems. Instructed him that per Dr. Yvone Neu if he has any symptoms then he needs to be evaluated in the emergency room. He currently denies any chest pain at this time. Let him know that if it returns before his scheduled appointment then he should go to emergency room to have it evaluated. Confirmed his upcoming appointment with Christell Faith PA. He verbalized understanding of our conversation, agreement with plan, and had no further questions at this time.

## 2016-08-17 NOTE — Telephone Encounter (Signed)
[facility] PCP called regarding an appointment for pt due to chest tightness. Made and appt for 9/13 at 11:00 for [name], [place]. Did let Rosaria Ferries know that Dr. Yvone Neu will review EKG that was performed today in [facility] office and call patient if needed to be seen sooner. Please advise after reviewing EKG.

## 2016-08-17 NOTE — Progress Notes (Signed)
Pre visit review using our clinic review tool, if applicable. No additional management support is needed unless otherwise documented below in the visit note. 

## 2016-08-17 NOTE — Addendum Note (Signed)
Addended by: Viviana Simpler I on: 08/17/2016 09:20 AM   Modules accepted: Orders

## 2016-08-17 NOTE — Assessment & Plan Note (Addendum)
Worrisome symptoms with SOB and diaphoresis Associated with stress but could be ischemic Did have reassuring stress test 2 years ago  EKG shows sinus rhythm and RBBB--no change since 2015 Will have him start omeprazole daily in case this could be acid related Will set back up with cardiology Asked him to hold off on the meloxicam for now as well

## 2016-08-18 LAB — T4, FREE: Free T4: 0.85 ng/dL (ref 0.60–1.60)

## 2016-08-23 NOTE — Progress Notes (Deleted)
Cardiology Office Note Date:  08/23/2016  Patient ID:  [name] [birth year], MRN [id number] PCP:  Melvin Simpler, MD  Cardiologist:  Dr. Fletcher Anon, MD  ***refresh   Chief Complaint: Chest pain  History of Present Illness: [name] is a 45 y.o. male with history of heart murmur 2/2 unspecified mitral valve disease, HLD, obesity, and anxiety who presents for evaluation of chest pain. He was last seen by Dr. Fletcher Anon on 09/08/2014 for evaluation of chest pain. He was previously evaluated several years ago for atypical chest pain by stress test that was unremarkable. In 08/2014 he reported chest pain that radiated to this throat and left arm while at work (standing as a Scientist, water quality). EKG at that time showed a RBBB. He underwent a treadmill Myoview that was normal on 09/10/2014. Echo was also ordered to evaluate his mitral valve though he did not have this study done. Patient called in 05/2015 with reported chest pain and was advised to go to the ED, they did not. Patient called in 06/2015 with multiple non-cardiac complaints including headache, otalgia, and sore throat. They were advised to seek assistance via PCP, ED, or urgent care. They did not seek care at that time. Patient was recently seen by PCP on 08/17/16 with complaints of dizziness, chest tightness and SOB in the setting of increased stress and longer hours at work. Also noted noctural headache as well as nocturia with polydipsia. EKG unchanged. Was started on PPI. Labs showed a glucose of 106 (random), K+ 4.1, SCr 0.91, unremarkable CBC, free T4 normal.    Past Medical History:  Diagnosis Date  . Complication of anesthesia 1996   ran a fever after sinus surgery  . Heart murmur   . Hyperlipidemia   . Mitral valve prolapse   . Rectal bleeding    polyp removed 01/19/2012 pre canerous    Past Surgical History:  Procedure Laterality Date  . CHONDROPLASTY  02/27/2012   Procedure: CHONDROPLASTY;  Surgeon: Alta Corning, MD;  Location:  [place];  Service: Orthopedics;  Laterality: Right;  patella-femoral joint  . COLONOSCOPY    . KNEE ARTHROSCOPY Left 3/16  . LASIK  2006   bilateral  . NASAL TURBINATE REDUCTION  1996    Current Outpatient Prescriptions  Medication Sig Dispense Refill  . meloxicam (MOBIC) 15 MG tablet Take 15 mg by mouth daily as needed for pain.     No current facility-administered medications for this visit.     Allergies:   Review of patient's allergies indicates no known allergies.   Social History:  The patient  reports that he has never smoked. He has never used smokeless tobacco. He reports that he drinks alcohol. He reports that he does not use drugs.   Family History:  The patient's family history includes Anesthesia problems in his mother; Arthritis in his son; Colitis in his father; Hypertension in his mother; Multiple sclerosis in his mother.  ROS:   ROS   PHYSICAL EXAM: *** VS:  There were no vitals taken for this visit. BMI: There is no height or weight on file to calculate BMI.  Physical Exam   EKG:  Was ordered and interpreted by me today. Shows ***  Recent Labs: 08/17/2016: ALT 60; BUN 19; Creatinine, Ser 0.91; Hemoglobin 14.9; Platelets 239.0; Potassium 4.1; Sodium 139  05/03/2016: Cholesterol 199; HDL 31.90; LDL Cholesterol 140; Total CHOL/HDL Ratio 6; Triglycerides 136.0; VLDL 27.2   Estimated Creatinine Clearance: 156.8 mL/min (by C-G formula based on SCr of  0.91 mg/dL).   Wt Readings from Last 3 Encounters:  08/17/16 (!) 312 lb (141.5 kg)  05/03/16 (!) 312 lb (141.5 kg)  04/22/16 (!) 315 lb (142.9 kg)     Other studies reviewed: Additional studies/records reviewed today include: summarized above  ASSESSMENT AND PLAN:  1. ***  Disposition: F/u with *** in   Current medicines are reviewed at length with the patient today.  The patient did not have any concerns regarding medicines.  Melvin Banker PA-C 08/23/2016 5:08 PM     [place] [place], [place] [id number] [phone]

## 2016-08-24 ENCOUNTER — Ambulatory Visit: Payer: BLUE CROSS/BLUE SHIELD | Admitting: Physician Assistant

## 2016-08-24 ENCOUNTER — Telehealth: Payer: Self-pay | Admitting: Cardiovascular Disease

## 2016-08-24 NOTE — Telephone Encounter (Signed)
Patient appt for chest pain cancelled appt as Thurmond Butts is sick.  Patient wants to see Dr. Rockey Situ bc wife is his patient and he wants to keep it in the family.  Spoke with [place]. Added to [facility] schedule as consult.  Patient would also like to  Be on the waitlist .  Patient prefers mondays and in mornings.  Can do other week days but only in am.

## 2016-10-10 ENCOUNTER — Encounter: Payer: Self-pay | Admitting: Cardiovascular Disease

## 2016-10-10 ENCOUNTER — Ambulatory Visit (INDEPENDENT_AMBULATORY_CARE_PROVIDER_SITE_OTHER): Payer: BLUE CROSS/BLUE SHIELD | Admitting: Cardiovascular Disease

## 2016-10-10 VITALS — BP 120/80 | HR 87 | Ht 76.0 in | Wt 318.8 lb

## 2016-10-10 DIAGNOSIS — R0789 Other chest pain: Secondary | ICD-10-CM

## 2016-10-10 DIAGNOSIS — F419 Anxiety disorder, unspecified: Secondary | ICD-10-CM

## 2016-10-10 DIAGNOSIS — R0602 Shortness of breath: Secondary | ICD-10-CM

## 2016-10-10 DIAGNOSIS — E7800 Pure hypercholesterolemia, unspecified: Secondary | ICD-10-CM | POA: Diagnosis not present

## 2016-10-10 DIAGNOSIS — Z7689 Persons encountering health services in other specified circumstances: Secondary | ICD-10-CM

## 2016-10-10 DIAGNOSIS — F39 Unspecified mood [affective] disorder: Secondary | ICD-10-CM | POA: Insufficient documentation

## 2016-10-10 NOTE — Progress Notes (Signed)
Cardiology Office Note  Date:  10/10/2016   ID:  [name], DOB 05-03-1971, MRN [id number]  PCP:  Viviana Simpler, MD   Chief Complaint  Patient presents with  . Establish Care    chest pain, tingling in arms, pain between shoulder blades, per pt    HPI:  [name] is a Pleasant 45 year old gentleman with morbid obesity, runs a local restaurant, recent severe stress, long work hours, developed chest pain and shortness of breath. He presents by referral from Dr.  Silvio Pate for consultation  of his chest pain symptoms  He denies any significant family history of coronary artery disease Reports that his mother died of MS, father still alive, no coronary disease  Reports that he is been working very hard recently, one of his workers was out, Wife takes care of 8 children, many of them have health conditions. He works long days, very hard in intensity, Few days to rest and recover Recently started taking Monday off to spend time with his children He reports one episode where he got dizzy, tight in chest and SOB Symptoms lasted for several days in a row  Also reports periodic headaches that would come and go Some nocturia, poor sleep hygiene Feeling exhaused. Does not do any regular exercise program Reports that today he feels okay, going to a movie with 2 of his sons He is excited to start water therapy, possibly with his son  Reports that he does lots of stress eating  Previous records reviewed stress test 2 years ago, 08/2014, no ischemia Echo 11/2014 [facility]  cardiac function  EKG shows sinus rhythm and RBBB, rate 79 bpm  He has chronic Achilles problems, had surgery left ankle Scheduled to have surgery right ankle Weight >300 pounds Non smoker, no diabetes   PMH:   has a past medical history of Complication of anesthesia (1996); Heart murmur; Hyperlipidemia; Mitral valve prolapse; and Rectal bleeding.  PSH:    Past Surgical History:  Procedure Laterality  Date  . CHONDROPLASTY  02/27/2012   Procedure: CHONDROPLASTY;  Surgeon: Alta Corning, MD;  Location: [place];  Service: Orthopedics;  Laterality: Right;  patella-femoral joint  . COLONOSCOPY    . KNEE ARTHROSCOPY Left 3/16  . LASIK  2006   bilateral  . NASAL TURBINATE REDUCTION  1996    Current Outpatient Prescriptions  Medication Sig Dispense Refill  . meloxicam (MOBIC) 15 MG tablet Take 15 mg by mouth daily as needed for pain.     No current facility-administered medications for this visit.      Allergies:   Review of patient's allergies indicates no known allergies.   Social History:  The patient  reports that he has never smoked. He has never used smokeless tobacco. He reports that he drinks alcohol. He reports that he does not use drugs.   Family History:   family history includes Anesthesia problems in his mother; Arthritis in his son; Colitis in his father; Hypertension in his mother; Multiple sclerosis in his mother.    Review of Systems: Review of Systems  Constitutional: Negative.   Respiratory: Positive for shortness of breath.   Cardiovascular: Positive for chest pain.  Gastrointestinal: Negative.   Musculoskeletal: Positive for joint pain.  Neurological: Negative.   Psychiatric/Behavioral: Negative.   All other systems reviewed and are negative.    PHYSICAL EXAM: VS:  BP 120/80   Pulse 87   Ht 6\' 4"  (1.93 m)   Wt (!) 318 lb 12.8 oz (144.6  kg)   SpO2 97%   BMI 38.81 kg/m  , BMI Body mass index is 38.81 kg/m. GEN: Well nourished, well developed, in no acute distress , obese HEENT: normal  Neck: no JVD, carotid bruits, or masses Cardiac: RRR; no murmurs, rubs, or gallops,no edema  Respiratory:  clear to auscultation bilaterally, normal work of breathing GI: soft, nontender, nondistended, + BS MS: no deformity or atrophy  Skin: warm and dry, no rash Neuro:  Strength and sensation are intact Psych: euthymic mood, full affect    Recent Labs: 08/17/2016:  ALT 60; BUN 19; Creatinine, Ser 0.91; Hemoglobin 14.9; Platelets 239.0; Potassium 4.1; Sodium 139    Lipid Panel Lab Results  Component Value Date   CHOL 199 05/03/2016   HDL 31.90 (L) 05/03/2016   LDLCALC 140 (H) 05/03/2016   TRIG 136.0 05/03/2016      Wt Readings from Last 3 Encounters:  10/10/16 (!) 318 lb 12.8 oz (144.6 kg)  08/17/16 (!) 312 lb (141.5 kg)  05/03/16 (!) 312 lb (141.5 kg)       ASSESSMENT AND PLAN:  Encounter to establish care with new doctor - Plan: EKG 12-Lead  Pure hypercholesterolemia Long discussion concerning his cholesterol He does not want a pill, prefers to do this through weight loss, exercise  Morbid obesity ([place]) We have encouraged continued exercise, careful diet management in an effort to lose weight. Discussed various strategies. He does not have time for weight watchers  Chest tightness Atypical chest discomfort, consistent with stress related symptoms Discussed various strategies for cardiac workup. He is unable to treadmill. Other screening options include CT coronary calcium scoring for risk stratification. He will call us if symptoms recur and this could be ordered. For severe symptoms, pharmacologic Myoview could be ordered. He may have attenuation artifact given his size.  Shortness of breath Shortness of breath seems to associate with his chest tightness in stressful situations.  Currently no clinical signs of CHF, recent normal echo We will hold off on any further testing at this time. Symptoms have improved as stress has abated  Anxiety Very stressed given his long work hours, taking care of a children some with medical issues   Total encounter time more than 45 minutes  Greater than 50% was spent in counseling and coordination of care with the patient   Disposition:   F/U  as needed   Orders Placed This Encounter  Procedures  . EKG 12-Lead     Signed, Esmond Plants, M.D., Ph.D. 10/10/2016  [place], [place]

## 2016-10-10 NOTE — Patient Instructions (Signed)
Medication Instructions:   No medication changes made  Labwork:  No new labs needed  Testing/Procedures:  No further testing at this time  Please research CT coronary calcium score    Follow-Up: It was a pleasure seeing you in the office today. Please call us if you have new issues that need to be addressed before your next appt.  [phone]  Your physician wants you to follow-up in: as needed  If you need a refill on your cardiac medications before your next appointment, please call your pharmacy.

## 2016-11-02 ENCOUNTER — Telehealth: Payer: Self-pay | Admitting: Cardiovascular Disease

## 2016-11-02 NOTE — Telephone Encounter (Signed)
Patient lost a diet list dr. Rockey Situ gave him at appt.  Please fax to [phone] if you can find a replacement.  Call Cell phone prior to fax so that they cannot answer the phone.

## 2016-11-02 NOTE — Telephone Encounter (Signed)
[facility] diet faxed to pt.

## 2016-11-11 ENCOUNTER — Other Ambulatory Visit (HOSPITAL_COMMUNITY): Payer: Self-pay | Admitting: Orthopedic Surgery

## 2016-11-11 DIAGNOSIS — M94261 Chondromalacia, right knee: Secondary | ICD-10-CM

## 2016-11-16 ENCOUNTER — Encounter: Payer: Self-pay | Admitting: Internal Medicine

## 2016-11-21 ENCOUNTER — Ambulatory Visit (HOSPITAL_COMMUNITY)
Admission: RE | Admit: 2016-11-21 | Discharge: 2016-11-21 | Disposition: A | Discharge status: Home / Self Care | Payer: BLUE CROSS/BLUE SHIELD | Source: Ambulatory Visit | Attending: Orthopedic Surgery | Admitting: Orthopedic Surgery

## 2016-11-21 DIAGNOSIS — M2341 Loose body in knee, right knee: Secondary | ICD-10-CM | POA: Diagnosis not present

## 2016-11-21 DIAGNOSIS — M23221 Derangement of posterior horn of medial meniscus due to old tear or injury, right knee: Secondary | ICD-10-CM | POA: Insufficient documentation

## 2016-11-21 DIAGNOSIS — M94261 Chondromalacia, right knee: Secondary | ICD-10-CM | POA: Diagnosis not present

## 2016-11-21 DIAGNOSIS — M23031 Cystic meniscus, other medial meniscus, right knee: Secondary | ICD-10-CM | POA: Diagnosis not present

## 2016-11-24 ENCOUNTER — Other Ambulatory Visit: Payer: Self-pay | Admitting: Orthopedic Surgery

## 2016-11-24 ENCOUNTER — Other Ambulatory Visit (HOSPITAL_COMMUNITY): Payer: Self-pay | Admitting: Orthopedic Surgery

## 2016-11-24 ENCOUNTER — Encounter: Payer: Self-pay | Admitting: Cardiovascular Disease

## 2016-11-24 DIAGNOSIS — M25562 Pain in left knee: Secondary | ICD-10-CM

## 2016-11-24 DIAGNOSIS — S83242A Other tear of medial meniscus, current injury, left knee, initial encounter: Secondary | ICD-10-CM

## 2016-11-25 ENCOUNTER — Telehealth: Payer: Self-pay | Admitting: Cardiovascular Disease

## 2016-11-25 NOTE — Telephone Encounter (Signed)
Acceptable risk No further testing needed 

## 2016-11-25 NOTE — Telephone Encounter (Signed)
Routed to fax # provided. 

## 2016-11-25 NOTE — Telephone Encounter (Signed)
Received urgent cardiac clearance request for pt to proceed w/ Rt knee arthroscopy on 11/28/16 w/ Dr. Dorna Leitz. Please route clearance to [place], Attn: Lacretia Nicks, [place] @ [phone]

## 2016-12-03 ENCOUNTER — Ambulatory Visit
Admission: RE | Admit: 2016-12-03 | Discharge: 2016-12-03 | Disposition: A | Discharge status: Home / Self Care | Payer: BLUE CROSS/BLUE SHIELD | Source: Ambulatory Visit | Attending: Orthopedic Surgery | Admitting: Orthopedic Surgery

## 2016-12-03 DIAGNOSIS — M25562 Pain in left knee: Secondary | ICD-10-CM

## 2016-12-08 ENCOUNTER — Ambulatory Visit (HOSPITAL_COMMUNITY): Payer: No Typology Code available for payment source

## 2017-01-18 ENCOUNTER — Encounter: Payer: Self-pay | Admitting: Internal Medicine

## 2017-03-07 ENCOUNTER — Other Ambulatory Visit: Payer: Self-pay | Admitting: Orthopedic Surgery

## 2017-03-09 ENCOUNTER — Encounter (HOSPITAL_COMMUNITY): Payer: Self-pay | Admitting: *Deleted

## 2017-03-09 MED ORDER — DEXTROSE 5 % IV SOLN
3.0000 g | INTRAVENOUS | Status: AC
  Administered 2017-03-10: 3 g via INTRAVENOUS
  Filled 2017-03-09: qty 3000

## 2017-03-09 NOTE — Progress Notes (Signed)
Anesthesia Chart Review:  Pt is a same day work up.   Pt is a 46 year old male scheduled for R knee arthroscopy on 03/10/2017 with Dorna Leitz, MD  - PCP is Viviana Simpler, MD - Cardiologist is Ida Rogue, MD who cleared pt for surgery. Last office visit 10/10/16.   PMH includes:  MVP, hyperlipidemia. Never smoker. BMI 39. S/p R knee arthroscopy 02/27/12.   Anesthesia history: Pt reports having an increased temperature after sinus surgery in 1996 in [place].  Pt also reports his mother had an increased temp and chills >30 years ago after c-section.  Anesthesia record from 02/27/12 in [place] does note pt having a history of MH, but no medications that could trigger MH were used for that surgery.   Medications: mobic  Labs will be obtained DOS.  EKG 10/10/16: NSR. RBBB. Inferior infarct, age undetermined. Anterolateral infarct, age undetermined.  Nuclear stress test 09/10/14:  1. Exercise myocardial perfusion study with no significant ischemia. 2. No significant wall motion abnormality noted.  If labs acceptable DOS, I anticipate pt can proceed as scheduled.   Willeen Cass, FNP-BC [facility]/Anesthesiology Phone: [phone] 03/09/2017 1:38 PM

## 2017-03-09 NOTE — H&P (Signed)
PREOPERATIVE H&P  Chief Complaint: left knee pain  HPI: [name] is a 46 y.o. male who presents for evaluation of left knee pain. It has been present for greater than 3 months and has been worsening. He has failed conservative measures. Pain is rated as moderate.  Past Medical History:  Diagnosis Date  . Complication of anesthesia 1996   ran a fever after sinus surgery.  In [place].  . Family history of adverse reaction to anesthesia    mother fever, chills  . Heart murmur   . Hyperlipidemia   . Mitral valve prolapse   . Rectal bleeding    polyp removed 01/19/2012 pre canerous   Past Surgical History:  Procedure Laterality Date  . CHONDROPLASTY  02/27/2012   Procedure: CHONDROPLASTY;  Surgeon: Alta Corning, MD;  Location: [facility];  Service: Orthopedics;  Laterality: Right;  patella-femoral joint  . COLONOSCOPY    . KNEE ARTHROSCOPY Left 3/16  . LASIK  2006   bilateral  . [place]   Social History   Social History  . Marital status: Married    Spouse name: N/A  . Number of children: N/A  . Years of education: N/A   Occupational History  . 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 Smoking status: Never Smoker  . Smokeless tobacco: Never Used  . Alcohol use 0.0 oz/week     Comment: occasional beer/wine  . Drug use: No  . Sexual activity: Not on file   Other Topics Concern  . Not on file   Social History Narrative   5 children and 3 step children   Family History  Problem Relation Age of Onset  . Multiple sclerosis Mother   . Anesthesia problems Mother   . Hypertension Mother   . Colitis Father     questionable  . Asthma      children; seasonal  . Arthritis Son     juvinile rheumatoid  . Colon cancer Neg Hx    Allergies  Allergen Reactions  . No Known Allergies    Prior to Admission medications   Medication Sig Start Date End Date Taking? Authorizing Provider  meloxicam (MOBIC) 15 MG  tablet Take 15 mg by mouth daily as needed for pain.    Historical Provider, MD     Positive ROS: none  All other systems have been reviewed and were otherwise negative with the exception of those mentioned in the HPI and as above.  Physical Exam: There were no vitals filed for this visit.  General: Alert, no acute distress Cardiovascular: No pedal edema Respiratory: No cyanosis, no use of accessory musculature GI: No organomegaly, abdomen is soft and non-tender Skin: No lesions in the area of chief complaint Neurologic: Sensation intact distally Psychiatric: Patient is competent for consent with normal mood and affect Lymphatic: No axillary or cervical lymphadenopathy  MUSCULOSKELETAL: lleft knee: Positive medial joint tenderness.  Positive McMurray.  No instability.  Trace effusion.  Assessment/Plan: left KNEE MEDIAL MENISCUS TEAR  Plan for Procedure(s): ARTHROSCOPY KNEE  The risks benefits and alternatives were discussed with the patient including but not limited to the risks of nonoperative treatment, versus surgical intervention including infection, bleeding, nerve injury, malunion, nonunion, hardware prominence, hardware failure, need for hardware removal, blood clots, cardiopulmonary complications, morbidity, mortality, among others, and they were willing to proceed.  Predicted outcome is good, although there will be at least a six to nine month expected recovery.  Alta Corning, MD 03/09/2017 10:37 PM

## 2017-03-09 NOTE — Progress Notes (Signed)
Mr Gadd reports that he was seen by Dr Rockey Situ 08/2016.  Patient was having chest tightness, since then he has decreased amount of caffeine and has no further chest discomfort.  Mr Vanecek states that it is left nee that he is having surgery on, orders are for right.  Patient reports that he knows he has a tear on the right knee, but the left knee is the one that gives him problems.   Mr Souter denies having any issus with anesthesia.  I asked about family history of problems with anesthesia first and he reported that his mother had elevated temperature and chills during C- section over 30 years ago.  I asked patient about the information in the chart that reports that he had elevated temp after nasal surgery. He said , "oh that was in 12 in [place], I haven't had any problem with any of the surgeries with Dr Berenice Primas."

## 2017-03-10 ENCOUNTER — Encounter (HOSPITAL_COMMUNITY): Payer: Self-pay | Admitting: General Practice

## 2017-03-10 ENCOUNTER — Encounter (HOSPITAL_COMMUNITY)
Admission: RE | Disposition: A | Discharge status: Home / Self Care | Payer: Self-pay | Source: Ambulatory Visit | Attending: Orthopedic Surgery

## 2017-03-10 ENCOUNTER — Ambulatory Visit (HOSPITAL_COMMUNITY): Payer: BLUE CROSS/BLUE SHIELD | Admitting: Vascular Surgery

## 2017-03-10 ENCOUNTER — Ambulatory Visit (HOSPITAL_COMMUNITY)
Admission: RE | Admit: 2017-03-10 | Discharge: 2017-03-10 | Disposition: A | Discharge status: Home / Self Care | Payer: BLUE CROSS/BLUE SHIELD | Source: Ambulatory Visit | Attending: Orthopedic Surgery | Admitting: Orthopedic Surgery

## 2017-03-10 DIAGNOSIS — M23252 Derangement of posterior horn of lateral meniscus due to old tear or injury, left knee: Secondary | ICD-10-CM | POA: Insufficient documentation

## 2017-03-10 DIAGNOSIS — Z6838 Body mass index (BMI) 38.0-38.9, adult: Secondary | ICD-10-CM | POA: Diagnosis not present

## 2017-03-10 DIAGNOSIS — S83242S Other tear of medial meniscus, current injury, left knee, sequela: Secondary | ICD-10-CM | POA: Diagnosis present

## 2017-03-10 DIAGNOSIS — I341 Nonrheumatic mitral (valve) prolapse: Secondary | ICD-10-CM | POA: Insufficient documentation

## 2017-03-10 DIAGNOSIS — M23204 Derangement of unspecified medial meniscus due to old tear or injury, left knee: Secondary | ICD-10-CM | POA: Diagnosis present

## 2017-03-10 DIAGNOSIS — S83242A Other tear of medial meniscus, current injury, left knee, initial encounter: Secondary | ICD-10-CM

## 2017-03-10 DIAGNOSIS — E785 Hyperlipidemia, unspecified: Secondary | ICD-10-CM | POA: Diagnosis not present

## 2017-03-10 DIAGNOSIS — M94262 Chondromalacia, left knee: Secondary | ICD-10-CM | POA: Diagnosis not present

## 2017-03-10 DIAGNOSIS — Z8249 Family history of ischemic heart disease and other diseases of the circulatory system: Secondary | ICD-10-CM | POA: Diagnosis not present

## 2017-03-10 DIAGNOSIS — S83282S Other tear of lateral meniscus, current injury, left knee, sequela: Secondary | ICD-10-CM | POA: Diagnosis present

## 2017-03-10 HISTORY — PX: KNEE ARTHROSCOPY: SHX127

## 2017-03-10 HISTORY — DX: Family history of other specified conditions: Z84.89

## 2017-03-10 LAB — BASIC METABOLIC PANEL
Anion gap: 9 (ref 5–15)
BUN: 16 mg/dL (ref 6–20)
CALCIUM: 8.9 mg/dL (ref 8.9–10.3)
CO2: 23 mmol/L (ref 22–32)
CREATININE: 0.9 mg/dL (ref 0.61–1.24)
Chloride: 108 mmol/L (ref 101–111)
GFR calc Af Amer: >60 mL/min (ref >60)
GLUCOSE: 102 mg/dL — AB (ref 65–99)
Potassium: 4.1 mmol/L (ref 3.5–5.1)
SODIUM: 140 mmol/L (ref 135–145)

## 2017-03-10 LAB — CBC
HEMATOCRIT: 41.1 % (ref 39.0–52.0)
Hemoglobin: 14.2 g/dL (ref 13.0–17.0)
MCH: 31.5 pg (ref 26.0–34.0)
MCHC: 34.5 g/dL (ref 30.0–36.0)
MCV: 91.1 fL (ref 78.0–100.0)
PLATELETS: 212 10*3/uL (ref 150–400)
RBC: 4.51 MIL/uL (ref 4.22–5.81)
RDW: 12.5 % (ref 11.5–15.5)
WBC: 5.7 10*3/uL (ref 4.0–10.5)

## 2017-03-10 SURGERY — ARTHROSCOPY, KNEE
Anesthesia: General | Laterality: Left

## 2017-03-10 MED ORDER — ONDANSETRON HCL 4 MG/2ML IJ SOLN
INTRAMUSCULAR | Status: DC | PRN
  Administered 2017-03-10: 4 mg via INTRAVENOUS

## 2017-03-10 MED ORDER — LIDOCAINE HCL (CARDIAC) 20 MG/ML IV SOLN
INTRAVENOUS | Status: DC | PRN
  Administered 2017-03-10: 100 mg via INTRAVENOUS

## 2017-03-10 MED ORDER — OXYCODONE HCL 5 MG PO TABS
ORAL_TABLET | ORAL | Status: AC
  Administered 2017-03-10: 5 mg via ORAL
  Filled 2017-03-10: qty 1

## 2017-03-10 MED ORDER — FENTANYL CITRATE (PF) 100 MCG/2ML IJ SOLN
INTRAMUSCULAR | Status: DC | PRN
Start: 2017-03-10 — End: 2017-03-10
  Administered 2017-03-10 (×3): 50 ug via INTRAVENOUS

## 2017-03-10 MED ORDER — BUPIVACAINE HCL (PF) 0.25 % IJ SOLN
INTRAMUSCULAR | Status: AC
  Filled 2017-03-10: qty 30

## 2017-03-10 MED ORDER — OXYCODONE HCL 5 MG PO TABS
5.0000 mg | ORAL_TABLET | Freq: Once | ORAL | Status: AC | PRN
  Administered 2017-03-10: 5 mg via ORAL

## 2017-03-10 MED ORDER — EPINEPHRINE PF 1 MG/ML IJ SOLN
INTRAMUSCULAR | Status: AC
  Filled 2017-03-10: qty 1

## 2017-03-10 MED ORDER — MEPERIDINE HCL 25 MG/ML IJ SOLN: 6.2500 mg | INTRAMUSCULAR | Status: DC | PRN

## 2017-03-10 MED ORDER — ACETAMINOPHEN 160 MG/5ML PO SOLN: 325.0000 mg | ORAL | Status: DC | PRN

## 2017-03-10 MED ORDER — FENTANYL CITRATE (PF) 100 MCG/2ML IJ SOLN
INTRAMUSCULAR | Status: AC
  Administered 2017-03-10: 50 ug via INTRAVENOUS
  Filled 2017-03-10: qty 2

## 2017-03-10 MED ORDER — MIDAZOLAM HCL 2 MG/2ML IJ SOLN
INTRAMUSCULAR | Status: AC
  Filled 2017-03-10: qty 2

## 2017-03-10 MED ORDER — FENTANYL CITRATE (PF) 100 MCG/2ML IJ SOLN
INTRAMUSCULAR | Status: AC
  Filled 2017-03-10: qty 2

## 2017-03-10 MED ORDER — ONDANSETRON HCL 4 MG/2ML IJ SOLN
INTRAMUSCULAR | Status: AC
  Filled 2017-03-10: qty 2

## 2017-03-10 MED ORDER — OXYCODONE-ACETAMINOPHEN 5-325 MG PO TABS: 1.0000 | ORAL_TABLET | Freq: Four times a day (QID) | ORAL | 0 refills | Status: DC | PRN

## 2017-03-10 MED ORDER — CHLORHEXIDINE GLUCONATE 4 % EX LIQD: 60.0000 mL | Freq: Once | CUTANEOUS | Status: DC

## 2017-03-10 MED ORDER — PROPOFOL 10 MG/ML IV BOLUS
INTRAVENOUS | Status: AC
  Filled 2017-03-10: qty 40

## 2017-03-10 MED ORDER — LACTATED RINGERS IV SOLN
INTRAVENOUS | Status: DC
  Administered 2017-03-10 (×2): via INTRAVENOUS

## 2017-03-10 MED ORDER — FENTANYL CITRATE (PF) 100 MCG/2ML IJ SOLN
25.0000 ug | INTRAMUSCULAR | Status: DC | PRN
  Administered 2017-03-10 (×2): 50 ug via INTRAVENOUS

## 2017-03-10 MED ORDER — ACETAMINOPHEN 325 MG PO TABS: 325.0000 mg | ORAL_TABLET | ORAL | Status: DC | PRN

## 2017-03-10 MED ORDER — ONDANSETRON HCL 4 MG/2ML IJ SOLN: 4.0000 mg | Freq: Once | INTRAMUSCULAR | Status: DC | PRN

## 2017-03-10 MED ORDER — EPINEPHRINE PF 1 MG/ML IJ SOLN
INTRAMUSCULAR | Status: DC | PRN
  Administered 2017-03-10: 1 mg

## 2017-03-10 MED ORDER — BUPIVACAINE HCL (PF) 0.25 % IJ SOLN
INTRAMUSCULAR | Status: DC | PRN
  Administered 2017-03-10: 30 mL

## 2017-03-10 MED ORDER — EPINEPHRINE PF 1 MG/ML IJ SOLN
INTRAMUSCULAR | Status: AC
  Filled 2017-03-10: qty 2

## 2017-03-10 MED ORDER — MIDAZOLAM HCL 5 MG/5ML IJ SOLN
INTRAMUSCULAR | Status: DC | PRN
  Administered 2017-03-10: 2 mg via INTRAVENOUS

## 2017-03-10 MED ORDER — OXYCODONE HCL 5 MG/5ML PO SOLN: 5.0000 mg | Freq: Once | ORAL | Status: AC | PRN

## 2017-03-10 MED ORDER — FENTANYL CITRATE (PF) 250 MCG/5ML IJ SOLN
INTRAMUSCULAR | Status: AC
  Filled 2017-03-10: qty 5

## 2017-03-10 MED ORDER — KETOROLAC TROMETHAMINE 30 MG/ML IJ SOLN
INTRAMUSCULAR | Status: AC
  Administered 2017-03-10: 30 mg via INTRAVENOUS
  Filled 2017-03-10: qty 1

## 2017-03-10 MED ORDER — KETOROLAC TROMETHAMINE 30 MG/ML IJ SOLN
30.0000 mg | Freq: Once | INTRAMUSCULAR | Status: DC | PRN
Start: 2017-03-10 — End: 2017-03-10
  Administered 2017-03-10: 30 mg via INTRAVENOUS

## 2017-03-10 MED ORDER — ONDANSETRON HCL 4 MG/2ML IJ SOLN
4.0000 mg | Freq: Once | INTRAMUSCULAR | Status: AC
  Administered 2017-03-10: 4 mg via INTRAVENOUS

## 2017-03-10 MED ORDER — PROPOFOL 10 MG/ML IV BOLUS
INTRAVENOUS | Status: DC | PRN
  Administered 2017-03-10: 200 mg via INTRAVENOUS
  Administered 2017-03-10: 50 mg via INTRAVENOUS

## 2017-03-10 MED ORDER — SODIUM CHLORIDE 0.9 % IR SOLN
Status: DC | PRN
  Administered 2017-03-10 (×2): 3000 mL

## 2017-03-10 SURGICAL SUPPLY — 45 items
APL SKNCLS STERI-STRIP NONHPOA (GAUZE/BANDAGES/DRESSINGS) ×1
BANDAGE ACE 4X5 VEL STRL LF (GAUZE/BANDAGES/DRESSINGS) ×1 IMPLANT
BANDAGE ACE 6X5 VEL STRL LF (GAUZE/BANDAGES/DRESSINGS) ×2 IMPLANT
BENZOIN TINCTURE PRP APPL 2/3 (GAUZE/BANDAGES/DRESSINGS) ×1 IMPLANT
BLADE CUDA 5.5 (BLADE) IMPLANT
BLADE CUTTER GATOR 3.5 (BLADE) IMPLANT
BLADE GREAT WHITE 4.2 (BLADE) ×2 IMPLANT
BNDG GAUZE ELAST 4 BULKY (GAUZE/BANDAGES/DRESSINGS) ×2 IMPLANT
BUR OVAL 4.0 (BURR) ×1 IMPLANT
CUFF TOURNIQUET SINGLE 34IN LL (TOURNIQUET CUFF) IMPLANT
CUFF TOURNIQUET SINGLE 44IN (TOURNIQUET CUFF) IMPLANT
DRAPE ARTHROSCOPY W/POUCH 114 (DRAPES) ×2 IMPLANT
DRAPE PROXIMA HALF (DRAPES) ×2 IMPLANT
DRAPE U-SHAPE 47X51 STRL (DRAPES) ×2 IMPLANT
DRSG EMULSION OIL 3X3 NADH (GAUZE/BANDAGES/DRESSINGS) ×2 IMPLANT
DRSG PAD ABDOMINAL 8X10 ST (GAUZE/BANDAGES/DRESSINGS) ×2 IMPLANT
DURAPREP 26ML APPLICATOR (WOUND CARE) ×2 IMPLANT
FILTER STRAW FLUID ASPIR (MISCELLANEOUS) ×2 IMPLANT
GAUZE SPONGE 4X4 12PLY STRL (GAUZE/BANDAGES/DRESSINGS) ×2 IMPLANT
GLOVE BIOGEL PI IND STRL 6.5 (GLOVE) IMPLANT
GLOVE BIOGEL PI IND STRL 8 (GLOVE) ×2 IMPLANT
GLOVE BIOGEL PI INDICATOR 6.5 (GLOVE) ×2
GLOVE BIOGEL PI INDICATOR 8 (GLOVE) ×2
GLOVE ECLIPSE 7.5 STRL STRAW (GLOVE) ×4 IMPLANT
GLOVE SURG SS PI 6.5 STRL IVOR (GLOVE) ×2 IMPLANT
GOWN STRL REUS W/ TWL LRG LVL3 (GOWN DISPOSABLE) ×2 IMPLANT
GOWN STRL REUS W/ TWL XL LVL3 (GOWN DISPOSABLE) ×2 IMPLANT
GOWN STRL REUS W/TWL LRG LVL3 (GOWN DISPOSABLE) ×4
GOWN STRL REUS W/TWL XL LVL3 (GOWN DISPOSABLE) ×4
KIT BASIN OR (CUSTOM PROCEDURE TRAY) ×2 IMPLANT
KIT ROOM TURNOVER OR (KITS) ×2 IMPLANT
NDL 18GX1X1/2 (RX/OR ONLY) (NEEDLE) ×1 IMPLANT
NEEDLE 18GX1X1/2 (RX/OR ONLY) (NEEDLE) ×2 IMPLANT
PACK ARTHROSCOPY DSU (CUSTOM PROCEDURE TRAY) ×2 IMPLANT
PAD ARMBOARD 7.5X6 YLW CONV (MISCELLANEOUS) ×4 IMPLANT
PAD CAST 4YDX4 CTTN HI CHSV (CAST SUPPLIES) ×2 IMPLANT
PADDING CAST COTTON 4X4 STRL (CAST SUPPLIES) ×4
SET ARTHROSCOPY TUBING (MISCELLANEOUS) ×2
SET ARTHROSCOPY TUBING LN (MISCELLANEOUS) ×1 IMPLANT
STRIP CLOSURE SKIN 1/2X4 (GAUZE/BANDAGES/DRESSINGS) ×1 IMPLANT
SUT ETHILON 4 0 PS 2 18 (SUTURE) ×2 IMPLANT
SYR 5ML LL (SYRINGE) ×2 IMPLANT
TOWEL OR 17X24 6PK STRL BLUE (TOWEL DISPOSABLE) ×2 IMPLANT
TOWEL OR 17X26 10 PK STRL BLUE (TOWEL DISPOSABLE) ×2 IMPLANT
WRAP KNEE MAXI GEL POST OP (GAUZE/BANDAGES/DRESSINGS) ×2 IMPLANT

## 2017-03-10 NOTE — Anesthesia Postprocedure Evaluation (Addendum)
Anesthesia Post Note  Patient: [name]  Procedure(s) Performed: Procedure(s) (LRB): ARTHROSCOPY KNEE (Left)  Patient location during evaluation: PACU Anesthesia Type: General Level of consciousness: awake and sedated Pain management: pain level controlled Vital Signs Assessment: post-procedure vital signs reviewed and stable Respiratory status: spontaneous breathing Cardiovascular status: stable Postop Assessment: no signs of nausea or vomiting Anesthetic complications: no        Last Vitals:  Vitals:   03/10/17 1145 03/10/17 1200  BP: (!) 132/99 136/88  Pulse: 74 63  Resp: 17 16  Temp:      Last Pain: There were no vitals filed for this visit. Pain Goal: Patients Stated Pain Goal: 3 (03/10/17 0849)               Brandilyn Nanninga JR,JOHN Mateo Flow

## 2017-03-10 NOTE — Anesthesia Preprocedure Evaluation (Signed)
Anesthesia Evaluation  Patient identified by MRN, date of birth, ID band Patient awake    Reviewed: Allergy & Precautions, H&P , NPO status , Patient's Chart, lab work & pertinent test results  History of Anesthesia Complications (+) history of anesthetic complications  Airway Mallampati: II  TM Distance: >3 FB Neck ROM: full    Dental no notable dental hx.    Pulmonary neg pulmonary ROS,    Pulmonary exam normal breath sounds clear to auscultation       Cardiovascular Exercise Tolerance: Good negative cardio ROS Normal cardiovascular exam Rhythm:regular Rate:Normal     Neuro/Psych negative neurological ROS     GI/Hepatic negative GI ROS, Neg liver ROS,   Endo/Other  Morbid obesity  Renal/GU negative Renal ROS     Musculoskeletal   Abdominal (+) + obese,   Peds  Hematology negative hematology ROS (+)   Anesthesia Other Findings   Reproductive/Obstetrics negative OB ROS                             Anesthesia Physical  Anesthesia Plan  ASA: III  Anesthesia Plan: General LMA   Post-op Pain Management:    Induction: Intravenous  Airway Management Planned: LMA  Additional Equipment:   Intra-op Plan:   Post-operative Plan:   Informed Consent: I have reviewed the patients History and Physical, chart, labs and discussed the procedure including the risks, benefits and alternatives for the proposed anesthesia with the patient or authorized representative who has indicated his/her understanding and acceptance.   Dental Advisory Given  Plan Discussed with: CRNA and Surgeon  Anesthesia Plan Comments:         Anesthesia Quick Evaluation

## 2017-03-10 NOTE — Brief Op Note (Signed)
03/10/2017  11:18 AM  PATIENT:  [name]  46 y.o. male  PRE-OPERATIVE DIAGNOSIS:  left KNEE MEDIAL MENISCUS TEAR   POST-OPERATIVE DIAGNOSIS:  left KNEE MEDIAL MENISCUS TEAR   PROCEDURE:  Procedure(s): ARTHROSCOPY KNEE (Left)  SURGEON:  Surgeon(s) and Role:    * Dorna Leitz, MD - Primary  PHYSICIAN ASSISTANT:   ASSISTANTS: bethune   ANESTHESIA:   general  EBL:  No intake/output data recorded.  BLOOD ADMINISTERED:none  DRAINS: none   LOCAL MEDICATIONS USED:  MARCAINE     SPECIMEN:  No Specimen  DISPOSITION OF SPECIMEN:  N/A  COUNTS:  YES  TOURNIQUET:  * No tourniquets in log *  DICTATION: .Other Dictation: Dictation Number [id number]  PLAN OF CARE: Discharge to home after PACU  PATIENT DISPOSITION:  PACU - hemodynamically stable.   Delay start of Pharmacological VTE agent (>24hrs) due to surgical blood loss or risk of bleeding: no

## 2017-03-10 NOTE — Anesthesia Procedure Notes (Signed)
Procedure Name: LMA Insertion Date/Time: 03/10/2017 10:43 AM Performed by: Scheryl Darter Pre-anesthesia Checklist: Patient identified, Emergency Drugs available, Suction available and Patient being monitored Patient Re-evaluated:Patient Re-evaluated prior to inductionOxygen Delivery Method: Circle System Utilized Preoxygenation: Pre-oxygenation with 100% oxygen Intubation Type: IV induction Ventilation: Mask ventilation without difficulty LMA: LMA inserted LMA Size: 5.0 Number of attempts: 1 Airway Equipment and Method: Bite block Placement Confirmation: positive ETCO2 Tube secured with: Tape Dental Injury: Teeth and Oropharynx as per pre-operative assessment

## 2017-03-10 NOTE — Transfer of Care (Signed)
Immediate Anesthesia Transfer of Care Note  Patient: Haward Pope  Procedure(s) Performed: Procedure(s): ARTHROSCOPY KNEE (Left)  Patient Location: PACU  Anesthesia Type:General  Level of Consciousness: awake, alert , oriented and sedated  Airway & Oxygen Therapy: Patient Spontanous Breathing and Patient connected to nasal cannula oxygen  Post-op Assessment: Report given to RN, Post -op Vital signs reviewed and stable and Patient moving all extremities  Post vital signs: Reviewed and stable  Last Vitals:  Vitals:   03/10/17 0821  BP: (!) 147/98  Pulse: 69  Resp: 20  Temp: 36.8 C    Last Pain: There were no vitals filed for this visit.    Patients Stated Pain Goal: 3 (29/93/71 6967)  Complications: No apparent anesthesia complications

## 2017-03-10 NOTE — Discharge Instructions (Signed)
POST-OP KNEE ARTHROSCOPY INSTRUCTIONS  °Dr. John Graves/Jim Kamdyn Colborn PA-C ° °Pain °You will be expected to have a moderate amount of pain in the affected knee for approximately two weeks. However, the first two days will be the most severe pain. A prescription has been provided to take as needed for the pain. The pain can be reduced by applying ice packs to the knee for the first 1-2 weeks post surgery. Also, keeping the leg elevated on pillows will help alleviate the pain. If you develop any acute pain or swelling in your calf muscle, please call the doctor. ° °Activity °It is preferred that you stay at bed rest for approximately 24 hours. However, you may go to the bathroom with help. Weight bearing as tolerated. You may begin the knee exercises the day of surgery. Discontinue crutches as the knee pain resolves. ° °Dressing °Keep the dressing dry. If the ace bandage should wrinkle or roll up, this can be rewrapped to prevent ridges in the bandage. You may remove all dressings in 48 hours,  apply bandaids to each wound. You may shower on the 4th day after surgery but no tub bath. ° °Symptoms to report to your doctor °Extreme pain °Extreme swelling °Temperature above 101 degrees °Change in the feeling, color, or movement of your toes °Redness, heat, or swelling at your incision ° °Exercise °If is preferred that as soon as possible you try to do a straight leg raise without bending the knee and concentrate on bringing the heel of your foot off the bed up to approximately 45 degrees and hold for the count of 10 seconds. Repeat this at least 10 times three or four times per day. Additional exercises are provided below. ° °You are encouraged to bend the knee as tolerated. ° °Follow-Up °Call to schedule a follow-up appointment in 5-7 days. Our office # is [phone]. ° °POST-OP EXERCISES ° °Short Arc Quads ° °1. Lie on back with legs straight. Place towel roll under thigh, just above knee. °2. Tighten thigh muscles to  straighten knee and lift heel off bed. °3. Hold for slow count of five, then lower. °4. Do three sets of ten ° ° ° °Straight Leg Raises ° °1. Lie on back with operative leg straight and other leg bent. °2. Keeping operative leg completely straight, slowly lift operative leg so foot is 5 inches off bed. °3. Hold for slow count of five, then lower. °4. Do three sets of ten. ° ° ° °DO BOTH EXERCISES 2 TIMES A DAY ° °Ankle Pumps ° °Work/move the operative ankle and foot up and down 10 times every hour while awake. °

## 2017-03-11 ENCOUNTER — Encounter (HOSPITAL_COMMUNITY): Payer: Self-pay | Admitting: Orthopedic Surgery

## 2017-03-13 NOTE — Op Note (Signed)
NAME:  [name], [name]                 ACCOUNT NO.:  MEDICAL RECORD NO.:  [id number]  LOCATION:                                 FACILITY:  PHYSICIAN:  Alta Corning, M.D.        DATE OF BIRTH:  DATE OF PROCEDURE:  03/10/2017 DATE OF DISCHARGE:                              OPERATIVE REPORT   IDENTIFICATION:  He is a 46 year old male in Orthopedic Surgery Service.  PREOPERATIVE DIAGNOSIS:  Medial meniscal tear, left, recurrent.  POSTOPERATIVE DIAGNOSES: 1. Medial meniscal tear, left, recurrent. 2. Chondromalacia of patellofemoral joint. 3. Small posterior horn lateral meniscal tear.  PROCEDURES: 1. Partial posterior horn medial meniscectomy with corresponding     debridement in the medial compartment. 2. Chondroplasty, patellofemoral joint down to bleeding bone where     necessary. 3. Partial lateral meniscectomy.  SURGEON:  Alta Corning, MD.  ASSISTANT:  Gary Fleet, PA.  ANESTHESIA:  General.  BRIEF HISTORY:  [name] is a 46 year old male with a long history of complaints of left knee pain.  He had been treated with arthroscopy several years ago and done reasonably well with that.  He unfortunately began having increasing pain and repeat MRI was performed, which showed that he had a recurrent posterior horn medial meniscal tear.  After failure of conservative care, he was taken to the operating room for operative knee arthroscopy.  DESCRIPTION OF PROCEDURE:  The patient was taken to the operating room. After adequate anesthesia was obtained with general anesthetic, the patient was placed supine on the operating table.  The left leg was prepped and draped in usual sterile fashion.  Following this, routine arthroscopic examination of the knee revealed there was obvious chondromalacia of the patellofemoral joint.  This was debrided back to a smooth and stable rim of articular cartilage and down to bleeding bone where necessary.  On attention to swelling in  the medial compartment, there was some chondromalacia, which was minimally debrided.  Attention was then turned to the posterior horn of medial meniscus where there was a posterior horn medial meniscal tear which was debrided back to a smooth and stable rim of meniscus.  Once this was done, the ACL was examined and noted to be normal.  Lateral side had a small peripheral lateral meniscal tear.  A small posterior horn lateral meniscectomy. Lateral meniscus was debrided back to a smooth and stable rim.  The lateral femoral condyle was within normal limits.  At this point, the knee was copiously and thoroughly irrigated.  Check was made both mediolaterally and patellofemoral for any loosen fragment and pieces of cartilage, seen none.  The knee was again irrigated, suctioned dry, and the portals were closed with the bandage.  A sterile compressive dressing was applied after 20 mL of 0.25% Marcaine was instilled in the knee for postoperative anesthesia.  The patient was taken to the recovery room and found to be in satisfactory condition.  Estimated blood loss for the procedure was minimal.     Alta Corning, M.D.     Corliss Skains  D:  03/10/2017  T:  03/11/2017  Job:  [id number]

## 2017-03-19 ENCOUNTER — Encounter: Payer: Self-pay | Admitting: Internal Medicine

## 2017-03-22 ENCOUNTER — Encounter (HOSPITAL_COMMUNITY): Payer: Self-pay

## 2017-03-22 ENCOUNTER — Emergency Department (HOSPITAL_COMMUNITY): Payer: BLUE CROSS/BLUE SHIELD

## 2017-03-22 ENCOUNTER — Emergency Department (HOSPITAL_COMMUNITY)
Admission: EM | Admit: 2017-03-22 | Discharge: 2017-03-22 | Disposition: A | Discharge status: Home / Self Care | Payer: BLUE CROSS/BLUE SHIELD | Attending: Emergency Medicine | Admitting: Emergency Medicine

## 2017-03-22 ENCOUNTER — Encounter: Payer: Self-pay | Admitting: Internal Medicine

## 2017-03-22 DIAGNOSIS — R202 Paresthesia of skin: Secondary | ICD-10-CM

## 2017-03-22 DIAGNOSIS — M79605 Pain in left leg: Secondary | ICD-10-CM

## 2017-03-22 LAB — CBC
HEMATOCRIT: 43.6 % (ref 39.0–52.0)
Hemoglobin: 15 g/dL (ref 13.0–17.0)
MCH: 30.7 pg (ref 26.0–34.0)
MCHC: 34.4 g/dL (ref 30.0–36.0)
MCV: 89.3 fL (ref 78.0–100.0)
PLATELETS: 251 10*3/uL (ref 150–400)
RBC: 4.88 MIL/uL (ref 4.22–5.81)
RDW: 12.4 % (ref 11.5–15.5)
WBC: 9.1 10*3/uL (ref 4.0–10.5)

## 2017-03-22 LAB — BASIC METABOLIC PANEL
ANION GAP: 9 (ref 5–15)
BUN: 18 mg/dL (ref 6–20)
CO2: 25 mmol/L (ref 22–32)
CREATININE: 0.99 mg/dL (ref 0.61–1.24)
Calcium: 9.5 mg/dL (ref 8.9–10.3)
Chloride: 105 mmol/L (ref 101–111)
GFR calc Af Amer: >60 mL/min (ref >60)
GFR calc non Af Amer: >60 mL/min (ref >60)
GLUCOSE: 102 mg/dL — AB (ref 65–99)
Potassium: 4.1 mmol/L (ref 3.5–5.1)
Sodium: 139 mmol/L (ref 135–145)

## 2017-03-22 LAB — I-STAT TROPONIN, ED: Troponin i, poc: 0 ng/mL (ref 0.00–0.08)

## 2017-03-22 MED ORDER — PREDNISONE 20 MG PO TABS
60.0000 mg | ORAL_TABLET | Freq: Once | ORAL | Status: AC
  Administered 2017-03-22: 60 mg via ORAL
  Filled 2017-03-22: qty 3

## 2017-03-22 MED ORDER — METHOCARBAMOL 500 MG PO TABS: 500.0000 mg | ORAL_TABLET | Freq: Two times a day (BID) | ORAL | 0 refills | Status: DC

## 2017-03-22 MED ORDER — PREDNISONE 10 MG PO TABS: ORAL_TABLET | ORAL | 0 refills | Status: DC

## 2017-03-22 NOTE — Telephone Encounter (Signed)
Pt also left vm at triage inquiring about referral for numbness in his L leg. Requesting to be referred to location in [place]

## 2017-03-22 NOTE — ED Notes (Signed)
Pt departed in NAD.  

## 2017-03-22 NOTE — ED Triage Notes (Signed)
Pt presents with c/o of left calf cramping for several months; pt states he had recent surgery; Pt also c/o central chest pain off and on for months; pt states pain is is sharp at 7/10 on arrival; pt denies SOB; pt speaking in full and complete sentences and able to walk to triage.

## 2017-03-25 ENCOUNTER — Encounter: Payer: Self-pay | Admitting: Emergency Medicine

## 2017-03-25 DIAGNOSIS — Y92481 Parking lot as the place of occurrence of the external cause: Secondary | ICD-10-CM | POA: Diagnosis not present

## 2017-03-25 DIAGNOSIS — S0990XA Unspecified injury of head, initial encounter: Secondary | ICD-10-CM | POA: Diagnosis present

## 2017-03-25 DIAGNOSIS — M542 Cervicalgia: Secondary | ICD-10-CM | POA: Insufficient documentation

## 2017-03-25 DIAGNOSIS — Y939 Activity, unspecified: Secondary | ICD-10-CM | POA: Diagnosis not present

## 2017-03-25 DIAGNOSIS — Z5321 Procedure and treatment not carried out due to patient leaving prior to being seen by health care provider: Secondary | ICD-10-CM | POA: Diagnosis not present

## 2017-03-25 DIAGNOSIS — R51 Headache: Secondary | ICD-10-CM | POA: Insufficient documentation

## 2017-03-25 DIAGNOSIS — Y999 Unspecified external cause status: Secondary | ICD-10-CM | POA: Insufficient documentation
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
## 2017-03-26 ENCOUNTER — Emergency Department
Admission: EM | Admit: 2017-03-26 | Discharge: 2017-03-26 | Disposition: A | Discharge status: Home / Self Care | Payer: BLUE CROSS/BLUE SHIELD | Attending: Emergency Medicine | Admitting: Emergency Medicine

## 2017-03-27 ENCOUNTER — Encounter: Payer: Self-pay | Admitting: Internal Medicine

## 2017-03-27 DIAGNOSIS — M79604 Pain in right leg: Secondary | ICD-10-CM

## 2017-03-27 DIAGNOSIS — M79605 Pain in left leg: Secondary | ICD-10-CM

## 2017-03-27 NOTE — ED Provider Notes (Signed)
[facility] DEPT Provider Note   CSN: [id number] Arrival date & time: 03/22/17  0249     History   Chief Complaint Chief Complaint  Patient presents with  . Leg Pain  . Chest Pain    HPI [name] is a 46 y.o. male.  Patient presents for evaluation of left LE discomfort for several months. He reports having an orthopedic procedure several months ago, then started having LE pain. He was sent for a doppler study of the leg which was negative, per patient. The pain has persisted to varying degrees and had another orthopedic procedure recently with worsening pain after this. He complains of chest discomfort, also for several months. No SOB, fever, cough.   The history is provided by the patient. No language interpreter was used.  Leg Pain    Chest Pain   Associated symptoms include leg pain. Pertinent negatives include no abdominal pain, no cough, no fever, no nausea, no shortness of breath and no weakness.    Past Medical History:  Diagnosis Date  . Complication of anesthesia 1996   ran a fever after sinus surgery.  In [place].  . Family history of adverse reaction to anesthesia    mother fever, chills  . Heart murmur   . Hyperlipidemia   . Mitral valve prolapse   . Rectal bleeding    polyp removed 01/19/2012 pre canerous    Patient Active Problem List   Diagnosis Date Noted  . Acute medial meniscus tear of left knee 03/10/2017  . Acute lateral meniscus tear of left knee 03/10/2017  . Chondromalacia of left knee 03/10/2017  . Morbid obesity ([place]) 10/10/2016  . Anxiety 10/10/2016  . Chest tightness 08/17/2016  . Ingrowing nail 05/10/2016  . Hyperlipidemia   . Routine general medical examination at a health care facility 01/04/2013  . Obesity 01/04/2013  . Personal history of colonic polyps = adenoma 01/24/2012    Past Surgical History:  Procedure Laterality Date  . CHONDROPLASTY  02/27/2012   Procedure: CHONDROPLASTY;  Surgeon: Alta Corning, MD;   Location: [facility];  Service: Orthopedics;  Laterality: Right;  patella-femoral joint  . COLONOSCOPY    . KNEE ARTHROSCOPY Left 3/16  . KNEE ARTHROSCOPY Left 03/10/2017   Procedure: ARTHROSCOPY KNEE;  Surgeon: Dorna Leitz, MD;  Location: [facility];  Service: Orthopedics;  Laterality: Left;  . LASIK  2006   bilateral  . [place] Medications    Prior to Admission medications   Medication Sig Start Date End Date Taking? Authorizing Provider  methocarbamol (ROBAXIN) 500 MG tablet Take 1 tablet (500 mg total) by mouth 2 (two) times daily. 03/22/17   Charlann Lange, PA-C  oxyCODONE-acetaminophen (PERCOCET/ROXICET) 5-325 MG tablet Take 1-2 tablets by mouth every 6 (six) hours as needed for severe pain. Patient not taking: Reported on 03/22/2017 03/10/17   Gary Fleet, PA-C  predniSONE (DELTASONE) 10 MG tablet Take 5 on day 2 (day one provided in ED) Take 4 on day 3 Take 3 on day 4 Take 2 on day 5 Take 1 on day 6 03/22/17   Charlann Lange, PA-C    Family History Family History  Problem Relation Age of Onset  . Multiple sclerosis Mother   . Anesthesia problems Mother   . Hypertension Mother   . Colitis Father     questionable  . Asthma      children; seasonal  . Arthritis Son     juvinile rheumatoid  .  Colon cancer Neg Hx     Social History Social History  Substance Use Topics  . Smoking status: Never Smoker  . Smokeless tobacco: Never Used  . Alcohol use 0.0 oz/week     Comment: occasional beer/wine     Allergies   No known allergies   Review of Systems Review of Systems  Constitutional: Negative for chills and fever.  Respiratory: Negative.  Negative for cough and shortness of breath.   Cardiovascular: Positive for chest pain.  Gastrointestinal: Negative.  Negative for abdominal pain and nausea.  Musculoskeletal:       See HPI.  Skin: Negative.  Negative for color change.  Neurological: Negative.  Negative for weakness.     Physical  Exam Updated Vital Signs BP (!) 106/59   Pulse (!) 57   Temp 97.9 F (36.6 C) (Oral)   Resp 15   SpO2 98%   Physical Exam  Constitutional: He is oriented to person, place, and time. He appears well-developed and well-nourished.  HENT:  Head: Normocephalic.  Neck: Normal range of motion. Neck supple.  Cardiovascular: Normal rate, regular rhythm and intact distal pulses.   Pulmonary/Chest: Effort normal and breath sounds normal. He has no wheezes. He has no rales. He exhibits no tenderness.  Abdominal: Soft. Bowel sounds are normal. There is no tenderness. There is no rebound and no guarding.  Musculoskeletal: Normal range of motion. He exhibits no edema.  No swelling of left LE in calf, ankle or foot. There is mild swelling to knee with recent, well healed surgical incisions c/w recent arthroscopy. No redness or warmth. There is no tenderness of left lower extremity.  Neurological: He is alert and oriented to person, place, and time.  Skin: Skin is warm and dry. No rash noted.  Psychiatric: He has a normal mood and affect.     ED Treatments / Results  Labs (all labs ordered are listed, but only abnormal results are displayed) Labs Reviewed  BASIC METABOLIC PANEL - Abnormal; Notable for the following:       Result Value   Glucose, Bld 102 (*)    All other components within normal limits  CBC  I-STAT TROPOININ, ED    EKG  EKG Interpretation  Date/Time:  Wednesday March 22 2017 03:13:38 EDT Ventricular Rate:  81 PR Interval:  180 QRS Duration: 146 QT Interval:  402 QTC Calculation: 466 R Axis:   -19 Text Interpretation:  Normal sinus rhythm Indeterminate axis Right bundle branch block Inferior infarct , age undetermined Abnormal ECG Confirmed by Christy Gentles  MD, DONALD ([id number]) on 03/23/2017 11:21:49 AM       Radiology No results found.  Procedures Procedures (including critical care time)  Medications Ordered in ED Medications  predniSONE (DELTASONE) tablet 60 mg  (60 mg Oral Given 03/22/17 0620)     Initial Impression / Assessment and Plan / ED Course  I have reviewed the triage vital signs and the nursing notes.  Pertinent labs & imaging results that were available during my care of the patient were reviewed by me and considered in my medical decision making (see chart for details).     Patient with LLE pain that concerned him for DVT. He has had symptoms for months, a previous DVT study, per patient. Doubt DVT after arthroscopy, without significant physical exam findings. Doubt PE without SOB, pleuritic pain or tachycardia/hypoxia.  He can be discharged home with return precautions.   Final Clinical Impressions(s) / ED Diagnoses   Final diagnoses:  Left  leg pain  Paresthesia of left leg    New Prescriptions Discharge Medication List as of 03/22/2017  6:15 AM    START taking these medications   Details  methocarbamol (ROBAXIN) 500 MG tablet Take 1 tablet (500 mg total) by mouth 2 (two) times daily., Starting Wed 03/22/2017, Print    predniSONE (DELTASONE) 10 MG tablet Take 5 on day 2 (day one provided in ED) Take 4 on day 3 Take 3 on day 4 Take 2 on day 5 Take 1 on day 6, Print         Charlann Lange, PA-C 03/27/17 [place], DO 03/27/17 [phone]

## 2017-03-28 ENCOUNTER — Encounter: Payer: Self-pay | Admitting: Neurology

## 2017-03-28 ENCOUNTER — Encounter: Payer: Self-pay | Admitting: Internal Medicine

## 2017-03-30 ENCOUNTER — Encounter: Payer: Self-pay | Admitting: Internal Medicine

## 2017-04-14 ENCOUNTER — Encounter: Payer: Self-pay | Admitting: Internal Medicine

## 2017-04-14 DIAGNOSIS — G479 Sleep disorder, unspecified: Secondary | ICD-10-CM

## 2017-05-03 ENCOUNTER — Encounter: Payer: Self-pay | Admitting: Internal Medicine

## 2017-05-03 ENCOUNTER — Ambulatory Visit (INDEPENDENT_AMBULATORY_CARE_PROVIDER_SITE_OTHER): Payer: BLUE CROSS/BLUE SHIELD | Admitting: Internal Medicine

## 2017-05-03 ENCOUNTER — Other Ambulatory Visit
Admission: RE | Admit: 2017-05-03 | Discharge: 2017-05-03 | Disposition: A | Discharge status: Home / Self Care | Payer: BLUE CROSS/BLUE SHIELD | Source: Ambulatory Visit | Attending: Internal Medicine | Admitting: Internal Medicine

## 2017-05-03 VITALS — BP 138/78 | HR 71 | Ht 76.0 in | Wt 323.0 lb

## 2017-05-03 DIAGNOSIS — R5383 Other fatigue: Secondary | ICD-10-CM | POA: Diagnosis not present

## 2017-05-03 DIAGNOSIS — G4719 Other hypersomnia: Secondary | ICD-10-CM

## 2017-05-03 DIAGNOSIS — R0600 Dyspnea, unspecified: Secondary | ICD-10-CM

## 2017-05-03 MED ORDER — ALBUTEROL SULFATE HFA 108 (90 BASE) MCG/ACT IN AERS: 2.0000 | INHALATION_SPRAY | RESPIRATORY_TRACT | 6 refills | Status: DC | PRN
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
## 2017-05-03 NOTE — Patient Instructions (Addendum)
Set up for sleep study at home Albuterol as needed Check Vit D and follow up with PCP

## 2017-05-04 ENCOUNTER — Encounter (HOSPITAL_COMMUNITY): Payer: Self-pay | Admitting: Orthopedic Surgery

## 2017-05-04 LAB — VITAMIN D 25 HYDROXY (VIT D DEFICIENCY, FRACTURES): VIT D 25 HYDROXY: 18.2 ng/mL — AB (ref 30.0–100.0)

## 2017-05-05 ENCOUNTER — Encounter: Payer: Self-pay | Admitting: Internal Medicine

## 2017-05-09 ENCOUNTER — Encounter: Payer: Self-pay | Admitting: Internal Medicine

## 2017-05-10 ENCOUNTER — Encounter: Payer: Self-pay | Admitting: Internal Medicine

## 2017-05-10 ENCOUNTER — Ambulatory Visit (INDEPENDENT_AMBULATORY_CARE_PROVIDER_SITE_OTHER): Payer: BLUE CROSS/BLUE SHIELD | Admitting: Internal Medicine

## 2017-05-10 VITALS — BP 110/84 | HR 68 | Temp 98.1°F | Ht 74.0 in | Wt 321.0 lb

## 2017-05-10 DIAGNOSIS — R5383 Other fatigue: Secondary | ICD-10-CM

## 2017-05-10 DIAGNOSIS — Z0000 Encounter for general adult medical examination without abnormal findings: Secondary | ICD-10-CM | POA: Diagnosis not present

## 2017-05-10 LAB — VITAMIN B12: VITAMIN B 12: 343 pg/mL (ref 211–911)

## 2017-05-10 LAB — T4, FREE: Free T4: 0.76 ng/dL (ref 0.60–1.60)

## 2017-05-10 LAB — TSH: TSH: 1.48 u[IU]/mL (ref 0.35–4.50)

## 2017-05-10 MED ORDER — VITAMIN D (ERGOCALCIFEROL) 1.25 MG (50000 UNIT) PO CAPS: 50000.0000 [IU] | ORAL_CAPSULE | ORAL | 1 refills | Status: DC

## 2017-05-10 NOTE — Patient Instructions (Signed)
I would recommend a second opinion with Dr Zollie Beckers for your orthopedic problems.

## 2017-05-10 NOTE — Assessment & Plan Note (Signed)
Likely related to OSA Will supplement vitamin D Check thyroid and B12

## 2017-05-10 NOTE — Assessment & Plan Note (Signed)
Generally healthy but multiple issues Likely OSA--- sleep study will be soon Will have repeat colonoscopy coming up Discussed healthy eating Limited in exercise due to the multiple ortho issues

## 2017-05-10 NOTE — Progress Notes (Signed)
Subjective:    Patient ID: [name], male    DOB: 07/07/1971, 46 y.o.   MRN: [id number]  HPI Here for physical  Reviewed recent medical consultations Gave albuterol MDI for prn Being tested for sleep apnea Tested vitamin D--not sure why---but it was low Trying to spend more time outside   Saw cardiologist--reviewed note No recurrence of chest pain Did have high BP when getting blood drawn once--quickly went down (?anxiety)  Had the knee procedure for torn meniscus Both showed abnormalities Decided to do the left--but still having symptoms there and decreased flexion Has most pain in at right Achilles in AM and at night. Doesn't notice it as much at work (tries to stay off it as much as he can). Thinks he may surgery there as well  Current Outpatient Prescriptions on File Prior to Visit  Medication Sig Dispense Refill  . albuterol (PROVENTIL HFA;VENTOLIN HFA) 108 (90 Base) MCG/ACT inhaler Inhale 2 puffs into the lungs every 4 (four) hours as needed for wheezing or shortness of breath. 1 Inhaler 6   No current facility-administered medications on file prior to visit.     Allergies  Allergen Reactions  . No Known Allergies     Past Medical History:  Diagnosis Date  . Complication of anesthesia 1996   ran a fever after sinus surgery.  In [place].  . Family history of adverse reaction to anesthesia    mother fever, chills  . Heart murmur   . Hyperlipidemia   . Mitral valve prolapse   . Rectal bleeding    polyp removed 01/19/2012 pre canerous    Past Surgical History:  Procedure Laterality Date  . CHONDROPLASTY  02/27/2012   Procedure: CHONDROPLASTY;  Surgeon: Alta Corning, MD;  Location: [facility];  Service: Orthopedics;  Laterality: Right;  patella-femoral joint  . COLONOSCOPY    . KNEE ARTHROSCOPY Left 3/16  . KNEE ARTHROSCOPY Left 03/10/2017   Procedure: ARTHROSCOPY KNEE;  Surgeon: Dorna Leitz, MD;  Location: [facility];  Service: Orthopedics;  Laterality: Left;  .  LASIK  2006   bilateral  . NASAL TURBINATE REDUCTION  1996    Family History  Problem Relation Age of Onset  . Multiple sclerosis Mother   . Anesthesia problems Mother   . Hypertension Mother   . Colitis Father        questionable  . Asthma Unknown        children; seasonal  . Arthritis Son        juvinile rheumatoid  . Colon cancer Neg Hx     Social History   Social History  . Marital status: Married    Spouse name: N/A  . Number of children: N/A  . Years of education: N/A   Occupational History  . 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 Smoking status: Never Smoker  . Smokeless tobacco: Never Used  . Alcohol use 0.0 oz/week     Comment: occasional beer/wine  . Drug use: No  . Sexual activity: Not on file   Other Topics Concern  . Not on file   Social History Narrative   5 children and 3 step children   Review of Systems  Constitutional: Positive for fatigue.       Weight drifting up  Wears seat belt  HENT: Negative for dental problem, hearing loss, tinnitus and trouble swallowing.        Keeps up with the dentist  Eyes: Negative for  visual disturbance.       No diplopia or unilateral vision loss  Respiratory: Positive for shortness of breath. Negative for cough and chest tightness.   Cardiovascular: Negative for chest pain, palpitations and leg swelling.  Gastrointestinal: Positive for anal bleeding. Negative for blood in stool and constipation.       Some heartburn when he gets anxious--- uses zantac and tums prn Due for repeat colonoscopy soon  Endocrine: Negative for polydipsia and polyuria.  Genitourinary: Negative for difficulty urinating, frequency and urgency.       Now has nocturia--?related to OSA No ED  Musculoskeletal: Positive for arthralgias. Negative for back pain.  Skin:       Has skin tag on leg--plans to see Dr Phillip Heal about this. Also needs follow up on moles  Allergic/Immunologic: Negative for  environmental allergies and immunocompromised state.  Neurological: Negative for dizziness, syncope, light-headedness and headaches.  Hematological: Negative for adenopathy. Does not bruise/bleed easily.  Psychiatric/Behavioral: Positive for sleep disturbance. The patient is nervous/anxious.        Anxiety and dysthymia with his medical issues and fatigue       Objective:   Physical Exam  Constitutional: He is oriented to person, place, and time. He appears well-developed and well-nourished. No distress.  HENT:  Head: Normocephalic and atraumatic.  Right Ear: External ear normal.  Left Ear: External ear normal.  Mouth/Throat: Oropharynx is clear and moist. No oropharyngeal exudate.  Eyes: Conjunctivae are normal. Pupils are equal, round, and reactive to light.  Neck: Normal range of motion. Neck supple. No thyromegaly present.  Cardiovascular: Normal rate, regular rhythm, normal heart sounds and intact distal pulses.  Exam reveals no gallop.   No murmur heard. Pulmonary/Chest: Effort normal and breath sounds normal. No respiratory distress. He has no wheezes. He has no rales.  Abdominal: Soft. There is no tenderness.  Musculoskeletal: He exhibits no edema or tenderness.  Lymphadenopathy:    He has no cervical adenopathy.  Neurological: He is alert and oriented to person, place, and time.  Skin:  Many benign nevi  Psychiatric: He has a normal mood and affect. His behavior is normal.          Assessment & Plan:

## 2017-05-12 DIAGNOSIS — G4733 Obstructive sleep apnea (adult) (pediatric): Secondary | ICD-10-CM | POA: Diagnosis not present

## 2017-05-15 ENCOUNTER — Ambulatory Visit: Payer: BLUE CROSS/BLUE SHIELD | Admitting: Internal Medicine

## 2017-05-15 ENCOUNTER — Ambulatory Visit (INDEPENDENT_AMBULATORY_CARE_PROVIDER_SITE_OTHER): Payer: BLUE CROSS/BLUE SHIELD | Admitting: Internal Medicine

## 2017-05-15 ENCOUNTER — Encounter: Payer: Self-pay | Admitting: Internal Medicine

## 2017-05-15 VITALS — BP 118/86 | HR 80 | Ht 74.0 in | Wt 319.0 lb

## 2017-05-15 DIAGNOSIS — E559 Vitamin D deficiency, unspecified: Secondary | ICD-10-CM

## 2017-05-15 DIAGNOSIS — R0789 Other chest pain: Secondary | ICD-10-CM | POA: Diagnosis not present

## 2017-05-15 DIAGNOSIS — Z86010 Personal history of colonic polyps: Secondary | ICD-10-CM | POA: Diagnosis not present

## 2017-05-15 MED ORDER — ERGOCALCIFEROL 1.25 MG (50000 UT) PO CAPS: 50000.0000 [IU] | ORAL_CAPSULE | ORAL | 0 refills | Status: DC

## 2017-05-15 NOTE — Patient Instructions (Signed)
  You have been scheduled for an endoscopy and colonoscopy. Please follow the written instructions given to you at your visit today. Please pick up your prep supplies at the pharmacy. If you use inhalers (even only as needed), please bring them with you on the day of your procedure. Your physician has requested that you go to www.startemmi.com and enter the access code given to you at your visit today. This web site gives a general overview about your procedure. However, you should still follow specific instructions given to you by our office regarding your preparation for the procedure.   Today we are giving you a printed rx for Vitamin D to take to the pharmacy.    I appreciate the opportunity to care for you. Silvano Rusk, MD, [facility]

## 2017-05-15 NOTE — Progress Notes (Signed)
[name] 46 y.o. 1971/04/23 [id number]  Assessment & Plan:   Encounter Diagnoses  Name Primary?  . Chest pain, non-cardiac Yes  . Hx of adenomatous polyp of colon   . Vitamin D deficiency     EGD Colonoscopy The risks and benefits as well as alternatives of endoscopic procedure(s) have been discussed and reviewed. All questions answered. The patient agrees to proceed.  Vit D Rx weekly  I appreciate the opportunity to care for him [name], MD, [facility]   Subjective:   Chief Complaint:  Chest pain and bloating  HPI Very nicee middle-aged wm last seen at colonoscopy 2013 w/ hx adenomatous colon polyp 2013 - has been having chest pain and post-prandial bloating of late. Saw Dr. Rockey Situ last Fall for chest pain. Pain is intermittent and better. No unintentional weight loss but feels bad and bloated in upper abdomen especially after foods like [place]. No Bowel changes. GI ROS - mild heartburn at times.  Recent vit D level low - was given biweekly Rx asking about weekly rc instead  Allergies  Allergen Reactions  . No Known Allergies    Current Meds  Medication Sig  . albuterol (PROVENTIL HFA;VENTOLIN HFA) 108 (90 Base) MCG/ACT inhaler Inhale 2 puffs into the lungs every 4 (four) hours as needed for wheezing or shortness of breath.  . [DISCONTINUED] Vitamin D, Ergocalciferol, (DRISDOL) 50000 units CAPS capsule Take 1 capsule (50,000 Units total) by mouth every 14 (fourteen) days.   Past Medical History:  Diagnosis Date  . Complication of anesthesia 1996   ran a fever after sinus surgery.  In [place].  . Family history of adverse reaction to anesthesia    mother fever, chills  . Heart murmur   . Hyperlipidemia   . Mitral valve prolapse   . Rectal bleeding    polyp removed 01/19/2012 pre canerous   Past Surgical History:  Procedure Laterality Date  . CHONDROPLASTY  02/27/2012   Procedure: CHONDROPLASTY;  Surgeon: Alta Corning, MD;  Location: [facility];  Service:  Orthopedics;  Laterality: Right;  patella-femoral joint  . COLONOSCOPY    . KNEE ARTHROSCOPY Left 3/16  . KNEE ARTHROSCOPY Left 03/10/2017   Procedure: ARTHROSCOPY KNEE;  Surgeon: Dorna Leitz, MD;  Location: [facility];  Service: Orthopedics;  Laterality: Left;  . LASIK  2006   bilateral  . NASAL TURBINATE REDUCTION  1996   .schx family history includes Anesthesia problems in his mother; Arthritis in his son; Colitis in his father; Hypertension in his mother; Multiple sclerosis in his mother.  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 ENT:   Mouth and posterior pharynx free of lesions.  Neck:   supple w/o thyromegaly or mass.  Lungs: Clear to auscultation bilaterally. Heart:  S1S2, no rubs, murmurs, gallops. Abdomen:  soft, non-tender, no hepatosplenomegaly, hernia, or mass and BS+.  Rectal: Deferred until colonoscpy Lymph:  no cervical or supraclavicular adenopathy. Extremities:   no edema, cyanosis or clubbing Skin   no rash. Neuro:  A&O x 3.  Psych:  appropriate mood and  Affect.   Data  Reviewed: PCP notes cardilogy notes 2015 stress test neg 03/2017 CXR NL

## 2017-05-16 ENCOUNTER — Telehealth: Payer: Self-pay | Admitting: *Deleted

## 2017-05-16 DIAGNOSIS — G4733 Obstructive sleep apnea (adult) (pediatric): Secondary | ICD-10-CM

## 2017-05-16 NOTE — Telephone Encounter (Signed)
Pt informed he has sleep apnea. Order placed for auto CPAP. Nothing further needed.

## 2017-05-19 NOTE — Addendum Note (Signed)
Addendum  created 05/19/17 1132 by Lyn Hollingshead, MD   Sign clinical note

## 2017-05-21 ENCOUNTER — Encounter: Payer: Self-pay | Admitting: Internal Medicine

## 2017-05-24 ENCOUNTER — Encounter: Payer: Self-pay | Admitting: Internal Medicine

## 2017-05-29 ENCOUNTER — Encounter: Payer: Self-pay | Admitting: Internal Medicine

## 2017-05-30 ENCOUNTER — Other Ambulatory Visit: Payer: Self-pay | Admitting: *Deleted

## 2017-05-30 DIAGNOSIS — G4733 Obstructive sleep apnea (adult) (pediatric): Secondary | ICD-10-CM

## 2017-05-30 NOTE — Progress Notes (Signed)
Order changed per DK auto 7-20 cm H2O. Order placed.

## 2017-06-04 ENCOUNTER — Encounter: Payer: Self-pay | Admitting: Internal Medicine

## 2017-06-05 ENCOUNTER — Encounter: Payer: Self-pay | Admitting: Neurology

## 2017-06-05 ENCOUNTER — Ambulatory Visit (INDEPENDENT_AMBULATORY_CARE_PROVIDER_SITE_OTHER): Payer: BLUE CROSS/BLUE SHIELD | Admitting: Neurology

## 2017-06-05 VITALS — BP 120/70 | HR 66 | Ht 74.0 in | Wt 323.3 lb

## 2017-06-05 DIAGNOSIS — G589 Mononeuropathy, unspecified: Secondary | ICD-10-CM

## 2017-06-05 NOTE — Patient Instructions (Signed)
Try to avoid over bending at the wrist and elbows  If your hand tingling does not improve, call the office and we will schedule you for nerve testing

## 2017-06-05 NOTE — Progress Notes (Signed)
[facility] Clinic Note - Initial Visit   Date: 06/05/17  [name] MRN: [id number] DOB: [birth year]   Dear Dr. Silvio Pate:  Thank you for your kind referral of [name] for consultation of bilateral hand paresthesias. Although his history is well known to you, please allow [place] to reiterate it for the purpose of our medical record. The patient was accompanied to the clinic by wife and two children who also provides collateral information.     History of Present Illness: [name] is a 46 y.o. right-handed Caucasian male with diet-controlled hyperlipidemia, OSA, and RBBB presenting for evaluation of bilateral hand paresthesias.    Starting early in 2018, he began having tingling of the hands, which occurs nightly and only with certain position.  He often wakes up at night because of these symptoms and has to shake his hands awake.  Within a few minutes, his tingling resolves.  He does not have associated weakness or neck pain.  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  He reports having biceps tendonitis on the right which is being addressed by is PCP.  Patient is also very concerned about the possibility of him having multiple sclerosis because his mother had it.   He denies any paresthesias of the feet, vision loss/changes, or weakness.   Out-side paper records, electronic medical record, and images have been reviewed where available and summarized as:  Lab Results  Component Value Date   TSH 1.48 05/10/2017   Lab Results  Component Value Date   VITAMINB12 343 05/10/2017     Past Medical History:  Diagnosis Date  . Complication of anesthesia 1996   ran a fever after sinus surgery.  In [place].  . Family history of adverse reaction to anesthesia    mother fever, chills  . Heart murmur   . Hyperlipidemia   . Mitral valve prolapse   . Rectal bleeding    polyp removed 01/19/2012 pre canerous    Past Surgical History:  Procedure Laterality Date  . CHONDROPLASTY  02/27/2012   Procedure: CHONDROPLASTY;  Surgeon: Alta Corning, MD;  Location: [facility];  Service: Orthopedics;  Laterality: Right;  patella-femoral joint  . COLONOSCOPY    . KNEE ARTHROSCOPY Left 3/16  . KNEE ARTHROSCOPY Left 03/10/2017   Procedure: ARTHROSCOPY KNEE;  Surgeon: Dorna Leitz, MD;  Location: [facility];  Service: Orthopedics;  Laterality: Left;  . LASIK  2006   bilateral  . NASAL TURBINATE REDUCTION  1996     Medications:  Outpatient Encounter Prescriptions as of 06/05/2017  Medication Sig  . albuterol (PROVENTIL HFA;VENTOLIN HFA) 108 (90 Base) MCG/ACT inhaler Inhale 2 puffs into the lungs every 4 (four) hours as needed for wheezing or shortness of breath.  . ergocalciferol (VITAMIN D2) 50000 units capsule Take 1 capsule (50,000 Units total) by mouth once a week.   No facility-administered encounter medications on file as of 06/05/2017.      Allergies:  Allergies  Allergen Reactions  . No Known Allergies     Family History: Family History  Problem Relation Age of Onset  . Multiple sclerosis Mother   . Anesthesia problems Mother   . Hypertension Mother   . Colitis Father        questionable  . Asthma Unknown        children; seasonal  . Arthritis Son        juvinile rheumatoid  .  Colon cancer Neg Hx     Social History: Social History  Substance Use Topics  . Smoking status: Never Smoker  . Smokeless tobacco: Never Used  . Alcohol use 0.0 oz/week     Comment: occasional beer/wine   Social History   Social History Narrative   [employer] [place] [employer]   Married   5 children and 3 step children   Education: high school.    Review of Systems:  CONSTITUTIONAL: No fevers, chills, night sweats, or weight loss.   EYES: No visual changes or eye pain ENT: No hearing changes.  No history of nose bleeds.   RESPIRATORY: No cough, wheezing and  shortness of breath.   CARDIOVASCULAR: Negative for chest pain, and palpitations.   GI: Negative for abdominal discomfort, blood in stools or black stools.  No recent change in bowel habits.   GU:  No history of incontinence.   MUSCLOSKELETAL: +history of joint pain or swelling.  No myalgias.   SKIN: Negative for lesions, rash, and itching.   HEMATOLOGY/ONCOLOGY: Negative for prolonged bleeding, bruising easily, and swollen nodes.  No history of cancer.   ENDOCRINE: Negative for cold or heat intolerance, polydipsia or goiter.  +polyuria PSYCH:  No depression or anxiety symptoms.   NEURO: As Above.   Vital Signs:  BP 120/70   Pulse 66   Ht 6\' 2"  (1.88 m)   Wt (!) 323 lb 5 oz (146.7 kg)   SpO2 97%   BMI 41.51 kg/m   Neurological Exam: MENTAL STATUS including orientation to time, place, person, recent and remote memory, attention span and concentration, language, and fund of knowledge is normal.  Speech is not dysarthric.  CRANIAL NERVES: II:  No visual field defects.  Unremarkable fundi.   III-IV-VI: Pupils equal round and reactive to light.  Normal conjugate, extra-ocular eye movements in all directions of gaze.  No nystagmus.  Mild left ptosis.   V:  Normal facial sensation.     VII:  Normal facial symmetry and movements.   VIII:  Normal hearing and vestibular function.   IX-X:  Normal palatal movement.   XI:  Normal shoulder shrug and head rotation.   XII:  Normal tongue strength and range of motion, no deviation or fasciculation.  MOTOR:  No atrophy, fasciculations or abnormal movements.  No pronator drift.  Tone is normal.    Right Upper Extremity:    Left Upper Extremity:    Deltoid  5/5   Deltoid  5/5   Biceps  5/5   Biceps  5/5   Triceps  5/5   Triceps  5/5   Wrist extensors  5/5   Wrist extensors  5/5   Wrist flexors  5/5   Wrist flexors  5/5   Finger extensors  5/5   Finger extensors  5/5   Finger flexors  5/5   Finger flexors  5/5   Dorsal interossei  5/5   Dorsal  interossei  5/5   Abductor pollicis  5/5   Abductor pollicis  5/5   Tone (Ashworth scale)  0  Tone (Ashworth scale)  0   Right Lower Extremity:    Left Lower Extremity:    Hip flexors  5/5   Hip flexors  5/5   Hip extensors  5/5   Hip extensors  5/5   Knee flexors  5/5   Knee flexors  5/5   Knee extensors  5/5   Knee extensors  5/5   Dorsiflexors  5/5  Dorsiflexors  5/5   Plantarflexors  5/5   Plantarflexors  5/5   Toe extensors  5/5   Toe extensors  5/5   Toe flexors  5/5   Toe flexors  5/5   Tone (Ashworth scale)  0  Tone (Ashworth scale)  0   MSRs:  Right                                                                 Left brachioradialis 2+  brachioradialis 2+  biceps 2+  biceps 2+  triceps 2+  triceps 2+  patellar 2+  patellar 2+  ankle jerk 2+  ankle jerk 2+  Hoffman no  Hoffman no  plantar response down  plantar response down   SENSORY:  Normal and symmetric perception of light touch, pinprick, vibration, and proprioception.  Romberg's sign absent.  Tinel's signs at the wrist and elbow bilaterally is negative.  COORDINATION/GAIT: Normal finger-to- nose-finger and heel-to-shin.  Intact rapid alternating movements bilaterally.  Able to rise from a chair without using arms.  Gait narrow based and stable. Tandem and stressed gait intact.    IMPRESSION: Mr. Hellen is a 46 year-old gentleman referred for evaluation of bilateral hand paresthesias.  He is also very concerned about potential for him to have multiple sclerosis as his mother had this disease.  His exam is entirely normal and non-focal and I reassured the patient that I do not see any evidence of central disease process.  Instead, his hand paresthesias are most likely due to transient nerve compression from sleeping in a position with wrist or elbows over flexed, as this only occurs at night.  I offered electrodiagnostic testing to better localize his symptoms, but because he only has intermittent and mild complaints,  he prefers to monitor symptoms and treat conservatively.  If symptoms progress, he will contact the office to schedule NCS/EMG of the arms.  The duration of this appointment visit was 35 minutes of face-to-face time with the patient.  Greater than 50% of this time was spent in counseling, explanation of diagnosis, planning of further management, and coordination of care.   Thank you for allowing me to participate in patient's care.  If I can answer any additional questions, I would be pleased to do so.    Sincerely,    Donika K. Posey Pronto, DO

## 2017-06-08 ENCOUNTER — Encounter: Payer: Self-pay | Admitting: Internal Medicine

## 2017-06-09 ENCOUNTER — Encounter: Payer: Self-pay | Admitting: Family Medicine

## 2017-06-09 ENCOUNTER — Ambulatory Visit (INDEPENDENT_AMBULATORY_CARE_PROVIDER_SITE_OTHER)
Admission: RE | Admit: 2017-06-09 | Discharge: 2017-06-09 | Disposition: A | Discharge status: Home / Self Care | Payer: BLUE CROSS/BLUE SHIELD | Source: Ambulatory Visit | Attending: Family Medicine | Admitting: Family Medicine

## 2017-06-09 ENCOUNTER — Ambulatory Visit (INDEPENDENT_AMBULATORY_CARE_PROVIDER_SITE_OTHER): Payer: BLUE CROSS/BLUE SHIELD | Admitting: Family Medicine

## 2017-06-09 VITALS — BP 126/86 | HR 74 | Temp 98.9°F | Wt 323.8 lb

## 2017-06-09 DIAGNOSIS — R05 Cough: Secondary | ICD-10-CM

## 2017-06-09 DIAGNOSIS — R059 Cough, unspecified: Secondary | ICD-10-CM

## 2017-06-09 MED ORDER — RANITIDINE HCL 150 MG PO TABS: 150.0000 mg | ORAL_TABLET | Freq: Two times a day (BID) | ORAL | Status: DC

## 2017-06-09 NOTE — Patient Instructions (Signed)
Use the inhaler tonight.  It will likely help some.  If it does help, then use it as needed over the next few days.  Either way, start back taking zantac twice a day for the next few days and update [place].  Take care.  Glad to see you.

## 2017-06-09 NOTE — Progress Notes (Signed)
dx'd with OSA recently started on CPAP.  Given ventolin inhaler per pulmonary.    Now with SOB, cough, cough with deep breath.  Dry cough.  Going on for about 1 month.  No fevers.  Gradually worse in the last month. No vomiting, no diarrhea.  No one else is sick.  No rash.  Minimal runny nose, first noted last night.  No ear pain.  Nonsmoker, never smoker.    He stopped using the inhaler in the meantime, not used in the last month.    He is in the midst of trying to get set up with adequate settings and face mask fit.  Off CPAP for about 1 week.    He doesn't have hx asthma in the past.    Meds, vitals, and allergies reviewed.   ROS: Per HPI unless specifically indicated in ROS section   GEN: nad, alert and oriented HEENT: mucous membranes moist, TM wnl, nasal exam slightly stuffy, OP wnl except for minimal irritation noted posteriorly  NECK: supple w/o LA CV: rrr. PULM: ctab, no inc wob, dry cough noted, no focal dec in BS ABD: soft, +bs EXT: no edema SKIN: no acute rash

## 2017-06-11 DIAGNOSIS — R059 Cough, unspecified: Secondary | ICD-10-CM | POA: Insufficient documentation

## 2017-06-11 DIAGNOSIS — R05 Cough: Secondary | ICD-10-CM | POA: Insufficient documentation

## 2017-06-11 NOTE — Assessment & Plan Note (Signed)
Differential discussed with patient, not limited to but including (in no specific order) asthma, COPD, allergies, GERD, medications. He does not have history of asthma or COPD. He is not on an ACE inhibitor. GERD is possible. He does not have a lot of typical seasonal allergy symptoms. X-ray negative, reviewed, discussed with patient. Would be reasonable to try albuterol in the short-term and then start taking Zantac twice a day for presumed GERD. Either way he will let us know how he is doing. Okay for outpatient follow-up. No sign of pneumonia on x-ray or exam. >25 minutes spent in face to face time with patient, >50% spent in counselling or coordination of care.

## 2017-06-21 ENCOUNTER — Encounter: Payer: Self-pay | Admitting: Internal Medicine

## 2017-06-21 DIAGNOSIS — G4719 Other hypersomnia: Secondary | ICD-10-CM

## 2017-06-26 ENCOUNTER — Encounter: Payer: Self-pay | Admitting: Internal Medicine

## 2017-06-26 ENCOUNTER — Ambulatory Visit (AMBULATORY_SURGERY_CENTER): Payer: BLUE CROSS/BLUE SHIELD | Admitting: Internal Medicine

## 2017-06-26 ENCOUNTER — Other Ambulatory Visit (INDEPENDENT_AMBULATORY_CARE_PROVIDER_SITE_OTHER): Payer: BLUE CROSS/BLUE SHIELD

## 2017-06-26 VITALS — BP 127/78 | HR 60 | Temp 98.6°F | Resp 11 | Ht 74.0 in | Wt 319.0 lb

## 2017-06-26 DIAGNOSIS — R0789 Other chest pain: Secondary | ICD-10-CM | POA: Diagnosis not present

## 2017-06-26 DIAGNOSIS — Z86010 Personal history of colonic polyps: Secondary | ICD-10-CM

## 2017-06-26 DIAGNOSIS — D123 Benign neoplasm of transverse colon: Secondary | ICD-10-CM | POA: Diagnosis not present

## 2017-06-26 DIAGNOSIS — E559 Vitamin D deficiency, unspecified: Secondary | ICD-10-CM

## 2017-06-26 DIAGNOSIS — D124 Benign neoplasm of descending colon: Secondary | ICD-10-CM

## 2017-06-26 DIAGNOSIS — K3189 Other diseases of stomach and duodenum: Secondary | ICD-10-CM

## 2017-06-26 LAB — VITAMIN D 25 HYDROXY (VIT D DEFICIENCY, FRACTURES): VITD: 45.53 ng/mL (ref 30.00–100.00)

## 2017-06-26 MED ORDER — SODIUM CHLORIDE 0.9 % IV SOLN: 500.0000 mL | INTRAVENOUS | Status: DC

## 2017-06-26 NOTE — Progress Notes (Signed)
Called to room to assist during endoscopic procedure.  Patient ID and intended procedure confirmed with present staff. Received instructions for my participation in the procedure from the performing physician.  

## 2017-06-26 NOTE — Patient Instructions (Addendum)
2 tiny colon polyps removed - not to worry - look benign.  Some mild irritation in top of intestine (duodenum). Might not mean anything but I took biopsies.  I will let you know results and plans.  I appreciate the opportunity to care for you. Melvin Mayer, MD, FACG YOU HAD AN ENDOSCOPIC PROCEDURE TODAY AT [facility]:   Refer to the procedure report that was given to you for any specific questions about what was found during the examination.  If the procedure report does not answer your questions, please call your gastroenterologist to clarify.  If you requested that your care partner not be given the details of your procedure findings, then the procedure report has been included in a sealed envelope for you to review at your convenience later.  YOU SHOULD EXPECT: Some feelings of bloating in the abdomen. Passage of more gas than usual.  Walking can help get rid of the air that was put into your GI tract during the procedure and reduce the bloating. If you had a lower endoscopy (such as a colonoscopy or flexible sigmoidoscopy) you may notice spotting of blood in your stool or on the toilet paper. If you underwent a bowel prep for your procedure, you may not have a normal bowel movement for a few days.  Please Note:  You might notice some irritation and congestion in your nose or some drainage.  This is from the oxygen used during your procedure.  There is no need for concern and it should clear up in a day or so.  SYMPTOMS TO REPORT IMMEDIATELY:   Following lower endoscopy (colonoscopy or flexible sigmoidoscopy):  Excessive amounts of blood in the stool  Significant tenderness or worsening of abdominal pains  Swelling of the abdomen that is new, acute  Fever of 100F or higher   Following upper endoscopy (EGD)  Vomiting of blood or coffee ground material  New chest pain or pain under the shoulder blades  Painful or persistently difficult swallowing  New  shortness of breath  Fever of 100F or higher  Black, tarry-looking stools  For urgent or emergent issues, a gastroenterologist can be reached at any hour by calling [phone].   DIET:  We do recommend a small meal at first, but then you may proceed to your regular diet.  Drink plenty of fluids but you should avoid alcoholic beverages for 24 hours.  MEDICATIONS: Continue present medications.  Please see handouts given to you by your recovery nurse.  ACTIVITY:  You should plan to take it easy for the rest of today and you should NOT DRIVE or use heavy machinery until tomorrow (because of the sedation medicines used during the test).    FOLLOW UP: Our staff will call the number listed on your records the next business day following your procedure to check on you and address any questions or concerns that you may have regarding the information given to you following your procedure. If we do not reach you, we will leave a message.  However, if you are feeling well and you are not experiencing any problems, there is no need to return our call.  We will assume that you have returned to your regular daily activities without incident.  If any biopsies were taken you will be contacted by phone or by letter within the next 1-3 weeks.  Please call us at [phone] if you have not heard about the biopsies in 3 weeks.   Thank  you for allowing [place] to provide for your healthcare needs today.  SIGNATURES/CONFIDENTIALITY: You and/or your care partner have signed paperwork which will be entered into your electronic medical record.  These signatures attest to the fact that that the information above on your After Visit Summary has been reviewed and is understood.  Full responsibility of the confidentiality of this discharge information lies with you and/or your care-partner.

## 2017-06-26 NOTE — Op Note (Signed)
[facility] Patient Name: [name] Procedure Date: 06/26/2017 2:43 PM MRN: [id number] Endoscopist: [name] , MD Age: 46 Referring MD:  Date of Birth: [birth year] Gender: Male Account #: [id number] Procedure:                Upper GI endoscopy Indications:              Dyspepsia, Chest pain (non cardiac) Medicines:                Propofol per Anesthesia, Monitored Anesthesia Care Procedure:                Pre-Anesthesia Assessment:                           - Prior to the procedure, a History and Physical                            was performed, and patient medications and                            allergies were reviewed. The patient's tolerance of                            previous anesthesia was also reviewed. The risks                            and benefits of the procedure and the sedation                            options and risks were discussed with the patient.                            All questions were answered, and informed consent                            was obtained. Prior Anticoagulants: The patient has                            taken no previous anticoagulant or antiplatelet                            agents. ASA Grade Assessment: II - A patient with                            mild systemic disease. After reviewing the risks                            and benefits, the patient was deemed in                            satisfactory condition to undergo the procedure.                           After obtaining informed consent, the endoscope was  passed under direct vision. Throughout the                            procedure, the patient's blood pressure, pulse, and                            oxygen saturations were monitored continuously. The                            Model GIF-[id number]) scope was introduced                            through the mouth, and advanced to the second part         of duodenum. The upper GI endoscopy was                            accomplished without difficulty. The patient                            tolerated the procedure well. Scope In: Scope Out: Findings:                 Patchy mildly erythematous mucosa without active                            bleeding and with no stigmata of bleeding was found                            in the duodenal bulb and in the second portion of                            the duodenum. some ? of notched folds. Biopsies                            were taken with a cold forceps for histology.                            Verification of patient identification for the                            specimen was done. Estimated blood loss was minimal.                           The exam was otherwise without abnormality.                           The cardia and gastric fundus were normal on                            retroflexion. Complications:            No immediate complications. Estimated Blood Loss:     Estimated blood loss was minimal. Impression:               - Erythematous duodenopathy. ? notched folds.  Biopsied.                           - The examination was otherwise normal. Recommendation:           - Patient has a contact number available for                            emergencies. The signs and symptoms of potential                            delayed complications were discussed with the                            patient. Return to normal activities tomorrow.                            Written discharge instructions were provided to the                            patient.                           - Resume previous diet.                           - Continue present medications.                           - Await pathology results.                           - See the other procedure note for documentation of                            additional recommendations. [name],  MD 06/26/2017 3:23:28 PM This report has been signed electronically.

## 2017-06-26 NOTE — Op Note (Signed)
[place] Patient Name: [name] Procedure Date: 06/26/2017 2:43 PM MRN: [id number] Endoscopist: [name] , MD Age: 46 Referring MD:  Date of Birth: [birth year] Gender: Male Account #: [id number] Procedure:                Colonoscopy Indications:              Surveillance: Personal history of adenomatous                            polyps on last colonoscopy 5 years ago Medicines:                Propofol per Anesthesia, Monitored Anesthesia Care Procedure:                Pre-Anesthesia Assessment:                           - Prior to the procedure, a History and Physical                            was performed, and patient medications and                            allergies were reviewed. The patient's tolerance of                            previous anesthesia was also reviewed. The risks                            and benefits of the procedure and the sedation                            options and risks were discussed with the patient.                            All questions were answered, and informed consent                            was obtained. Prior Anticoagulants: The patient has                            taken no previous anticoagulant or antiplatelet                            agents. ASA Grade Assessment: II - A patient with                            mild systemic disease. After reviewing the risks                            and benefits, the patient was deemed in                            satisfactory condition to undergo the procedure.                           -  Prior to the procedure, a History and Physical                            was performed, and patient medications and                            allergies were reviewed. The patient's tolerance of                            previous anesthesia was also reviewed. The risks                            and benefits of the procedure and the sedation                            options and  risks were discussed with the patient.                            All questions were answered, and informed consent                            was obtained. Prior Anticoagulants: The patient has                            taken no previous anticoagulant or antiplatelet                            agents. ASA Grade Assessment: II - A patient with                            mild systemic disease. After reviewing the risks                            and benefits, the patient was deemed in                            satisfactory condition to undergo the procedure.                           After obtaining informed consent, the colonoscope                            was passed under direct vision. Throughout the                            procedure, the patient's blood pressure, pulse, and                            oxygen saturations were monitored continuously. The                            Colonoscope was introduced through the anus and  advanced to the the cecum, identified by                            appendiceal orifice and ileocecal valve. The                            colonoscopy was performed without difficulty. The                            patient tolerated the procedure well. The quality                            of the bowel preparation was good. The ileocecal                            valve, appendiceal orifice, and rectum were                            photographed. The bowel preparation used was                            Miralax. Scope In: 3:00:30 PM Scope Out: 3:15:09 PM Scope Withdrawal Time: 0 hours 10 minutes 58 seconds  Total Procedure Duration: 0 hours 14 minutes 39 seconds  Findings:                 The perianal and digital rectal examinations were                            normal. Pertinent negatives include normal prostate                            (size, shape, and consistency).                           A diminutive polyp was found  in the descending                            colon. The polyp was sessile. The polyp was removed                            with a cold snare. Resection and retrieval were                            complete. Verification of patient identification                            for the specimen was done. Estimated blood loss was                            minimal.                           A diminutive polyp was found in the transverse  colon. The polyp was sessile. The polyp was removed                            with a cold biopsy forceps. Resection and retrieval                            were complete. Verification of patient                            identification for the specimen was done. Estimated                            blood loss was minimal.                           The exam was otherwise without abnormality on                            direct and retroflexion views. Complications:            No immediate complications. Estimated Blood Loss:     Estimated blood loss was minimal. Impression:               - One diminutive polyp in the descending colon,                            removed with a cold snare. Resected and retrieved.                           - One diminutive polyp in the transverse colon,                            removed with a cold biopsy forceps. Resected and                            retrieved.                           - The examination was otherwise normal on direct                            and retroflexion views.                           - Personal history of colonic polyps. Recommendation:           - Patient has a contact number available for                            emergencies. The signs and symptoms of potential                            delayed complications were discussed with the                            patient. Return to normal  activities tomorrow.                            Written discharge instructions were  provided to the                            patient.                           - Resume previous diet.                           - Continue present medications.                           - Repeat colonoscopy is recommended for                            surveillance. The colonoscopy date will be                            determined after pathology results from today's                            exam become available for review. [name], MD 06/26/2017 3:27:00 PM This report has been signed electronically.

## 2017-06-26 NOTE — Progress Notes (Signed)
A and O x3. Report to RN. Tolerated MAC anesthesia well.Teeth unchanged after procedure.

## 2017-06-27 ENCOUNTER — Telehealth: Payer: Self-pay | Admitting: *Deleted

## 2017-06-27 NOTE — Progress Notes (Signed)
Vitamin D level is fine My Chart note

## 2017-06-27 NOTE — Telephone Encounter (Signed)
  Follow up Call-  Call back number 06/26/2017  Post procedure Call Back phone  # [phone]  Permission to leave phone message Yes  Some recent data might be hidden     Patient questions:  Do you have a fever, pain , or abdominal swelling? No. Pain Score  0 *  Have you tolerated food without any problems? Yes.    Have you been able to return to your normal activities? Yes.    Do you have any questions about your discharge instructions: Diet   No. Medications  No. Follow up visit  No.  Do you have questions or concerns about your Care? No.  Actions: * If pain score is 4 or above: No action needed, pain <4.

## 2017-06-30 ENCOUNTER — Telehealth: Payer: Self-pay | Admitting: Internal Medicine

## 2017-06-30 NOTE — Telephone Encounter (Signed)
Patient notified that path is not back yet, glitch with MyChart and that we will send a letter when they are available.

## 2017-07-02 ENCOUNTER — Encounter: Payer: Self-pay | Admitting: Internal Medicine

## 2017-07-02 NOTE — Progress Notes (Signed)
2 adenomas NL duodenal bxs  1) My Chart letter 2)  for 5 yr colon recall 2023 3) Trial of [place]

## 2017-08-03 ENCOUNTER — Encounter: Payer: Self-pay | Admitting: Internal Medicine

## 2017-08-03 ENCOUNTER — Ambulatory Visit (INDEPENDENT_AMBULATORY_CARE_PROVIDER_SITE_OTHER): Payer: BLUE CROSS/BLUE SHIELD | Admitting: Internal Medicine

## 2017-08-03 VITALS — BP 132/82 | HR 72 | Resp 16 | Ht 74.0 in | Wt 318.0 lb

## 2017-08-03 DIAGNOSIS — G4733 Obstructive sleep apnea (adult) (pediatric): Secondary | ICD-10-CM | POA: Diagnosis not present

## 2017-08-03 NOTE — Progress Notes (Signed)
   Name: [name] MRN: [id number] DOB: 10-26-1971     CONSULTATION DATE: (Not on file)  REFERRING MD :  [place]: excessive daytime sleepiness  STUDIES:  CXR 03/2017 I have Independently reviewed images of CXR   on 08/03/2017 Interpretation:no infiltrates, no effusions, no edema Previous records reviewed stress test 2 years ago, 08/2014, no ischemia Echo 11/2014 NL  cardiac function  Sleep study 05/10/17 235 apnea hypotony events AHI 38 426 times of desaturations   HISTORY OF PRESENT ILLNESS:   Patient diagnosed with severe sleep apnea AHI of 38 Patient states he is taking and using his CPAP machine on a daily basis Patient has tried a full mask but was not getting a good CL now he is wearing a nasal mask His AHI and compliance report 0.6 at this time Patient has no signs of infection at this time No signs of congestive heart failure at this time His energy has increased excessive daytime sleepiness has improved  REVIEW OF SYSTEMS:   Constitutional: Negative for fever, chills, weight loss, +malaise/fatigue  HENT: Negative for hearing loss, ear pain, nosebleeds, congestion, sore throat, neck pain, tinnitus and ear discharge.   Eyes: Negative for blurred vision, double vision, photophobia, pain, discharge and redness.  Respiratory: +cough,- hemoptysis,- sputum production,+shortness of breath, +wheezing and -stridor.   Cardiovascular: Negative for chest pain, palpitations, orthopnea, claudication, leg swelling and PND.   ALL OTHER ROS ARE NEGATIVE   BP 132/82 (BP Location: Left Arm, Cuff Size: Large)   Pulse 72   Resp 16   Ht 6\' 2"  (1.88 m)   Wt (!) 318 lb (144.2 kg)   SpO2 95%   BMI 40.83 kg/m     Physical Examination:   GENERAL:NAD, no fevers, chills, + fatigue HEAD: Normocephalic, atraumatic.  EYES: Pupils equal, round, reactive to light. Extraocular muscles intact. No scleral icterus.  MOUTH: Moist mucosal membrane.   EAR, NOSE, THROAT:  Clear without exudates. No external lesions.  NECK: Supple. No thyromegaly. No nodules. No JVD.  PULMONARY:CTA B/L no wheezes, no crackles, no rhonchi CARDIOVASCULAR: S1 and S2. Regular rate and rhythm. No murmurs, rubs, or gallops. No edema.       ASSESSMENT / PLAN:  46 yo morbidly obese white male with excessive daytime sleepiness and fatigue with severe sleep apnea with intermittent SOB and wheezing with underlying reactive airways disease  1.SOB -from deconditioned state and obesity  2.excessive daytime sleepiness with severe sleep apnea AHI currently is 0.6, previously was 38 -sleep hygiene discussed with patient  3.SOB/Wheezing -likely underlying intermittent reactive airways disease from underlying obesity -suggest to use albuterol as needed -avoid heat as much as possible  4.Morbid Obesity -recommend diet and exercise -recommend weight loss    Patient/Family are satisfied with Plan of action and management. All questions answered Follow-up in 6 months   Maciel Kegg Patricia Pesa, M.D.  [facility]  Medical Director [place] Director [facility]

## 2017-08-03 NOTE — Patient Instructions (Signed)
Continue AUTO CPAP as instructed

## 2017-08-06 ENCOUNTER — Encounter: Payer: Self-pay | Admitting: Internal Medicine

## 2017-08-23 ENCOUNTER — Encounter: Payer: Self-pay | Admitting: Internal Medicine

## 2017-10-16 ENCOUNTER — Encounter: Payer: Self-pay | Admitting: Internal Medicine

## 2017-10-16 MED ORDER — KETOCONAZOLE 2 % EX CREA: 1.0000 "application " | TOPICAL_CREAM | Freq: Two times a day (BID) | CUTANEOUS | 1 refills | Status: DC

## 2017-10-16 MED ORDER — FLUCONAZOLE 150 MG PO TABS: 150.0000 mg | ORAL_TABLET | Freq: Once | ORAL | 1 refills | Status: DC

## 2017-10-18 ENCOUNTER — Encounter: Payer: Self-pay | Admitting: Internal Medicine

## 2017-10-23 ENCOUNTER — Ambulatory Visit (INDEPENDENT_AMBULATORY_CARE_PROVIDER_SITE_OTHER): Payer: BLUE CROSS/BLUE SHIELD | Admitting: Internal Medicine

## 2017-10-23 ENCOUNTER — Encounter: Payer: Self-pay | Admitting: Internal Medicine

## 2017-10-23 VITALS — BP 104/72 | HR 70 | Temp 97.8°F | Wt 325.0 lb

## 2017-10-23 DIAGNOSIS — R21 Rash and other nonspecific skin eruption: Secondary | ICD-10-CM | POA: Diagnosis not present

## 2017-10-23 MED ORDER — FLUCONAZOLE 150 MG PO TABS: 150.0000 mg | ORAL_TABLET | ORAL | 5 refills | Status: DC

## 2017-10-23 NOTE — Progress Notes (Signed)
Subjective:    Patient ID: [name], male    DOB: 04-01-1971, 46 y.o.   MRN: [id number]  HPI Here for ongoing problems with genital rash  Started at least a couple of weeks Tried miconazole cream--not much help Did use the ketconazole and fluconazole Still some itching Showers with Dial soap  Is some better--but he wants it checked No discharge  Current Outpatient Medications on File Prior to Visit  Medication Sig Dispense Refill  . albuterol (PROVENTIL HFA;VENTOLIN HFA) 108 (90 Base) MCG/ACT inhaler Inhale 2 puffs into the lungs every 4 (four) hours as needed for wheezing or shortness of breath. 1 Inhaler 6  . ranitidine (ZANTAC) 150 MG tablet Take 1 tablet (150 mg total) by mouth 2 (two) times daily.    Marland Kitchen ketoconazole (NIZORAL) 2 % cream Apply 1 application 2 (two) times daily topically. (Patient not taking: Reported on 10/23/2017) 60 g 1   No current facility-administered medications on file prior to visit.     Allergies  Allergen Reactions  . No Known Allergies     Past Medical History:  Diagnosis Date  . Complication of anesthesia 1996   ran a fever after sinus surgery.  In [place].  . Family history of adverse reaction to anesthesia    mother fever, chills  . Heart murmur   . Hyperlipidemia   . Mitral valve prolapse   . Personal history of colonic polyps = adenoma 01/24/2012   5 mm adenoma removed 01/2012 Carlean Purl   . Rectal bleeding    polyp removed 01/19/2012 pre canerous    Past Surgical History:  Procedure Laterality Date  . COLONOSCOPY    . KNEE ARTHROSCOPY Left 3/16  . LASIK  2006   bilateral  . NASAL TURBINATE REDUCTION  1996    Family History  Problem Relation Age of Onset  . Multiple sclerosis Mother   . Anesthesia problems Mother   . Hypertension Mother   . Colitis Father        questionable  . Asthma Unknown        children; seasonal  . Arthritis Son        juvinile rheumatoid  . Colon cancer Neg Hx     Social History    Socioeconomic History  . Marital status: Married    Spouse name: Not on file  . Number of children: 8  . Years of education: 62  . Highest education level: Not on file  Social Needs  . Financial resource strain: Not on file  . Food insecurity - worry: Not on file  . Food insecurity - inability: Not on file  . Transportation needs - medical: Not on file  . Transportation needs - non-medical: Not on file  Occupational History  . 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 Smoking status: Never Smoker  . Smokeless tobacco: Never Used  Substance and Sexual Activity  . Alcohol use: Yes    Alcohol/week: 0.0 oz    Comment: occasional beer/wine  . Drug use: No  . Sexual activity: Not on file  Other Topics Concern  . 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   Review of Systems Has been walking more--this seems to exacerbate it No fever Monogamous with wife Now sleeps with CPAP    Objective:   Physical Exam  Genitourinary:  Genitourinary Comments: Diffuse mild redness over scrotum Some redness of penile shaft near glans No specific lesions or ulcers          Assessment & Plan:

## 2017-10-23 NOTE — Assessment & Plan Note (Signed)
Probably fungal  Is better now after the fluconazole Will continue the ketoconazole cream [place] or other medicated powder Advised the Dove soap is better than Dial

## 2017-10-26 ENCOUNTER — Ambulatory Visit: Payer: BLUE CROSS/BLUE SHIELD | Admitting: Internal Medicine

## 2017-10-29 ENCOUNTER — Encounter: Payer: Self-pay | Admitting: Neurology

## 2017-10-30 ENCOUNTER — Other Ambulatory Visit: Payer: Self-pay | Admitting: *Deleted

## 2017-10-30 ENCOUNTER — Encounter: Payer: Self-pay | Admitting: Neurology

## 2017-10-30 DIAGNOSIS — R202 Paresthesia of skin: Secondary | ICD-10-CM

## 2017-11-08 ENCOUNTER — Ambulatory Visit: Payer: BLUE CROSS/BLUE SHIELD | Admitting: Internal Medicine

## 2017-11-09 ENCOUNTER — Encounter: Payer: Self-pay | Admitting: Internal Medicine

## 2017-11-13 ENCOUNTER — Encounter: Payer: Self-pay | Admitting: Internal Medicine

## 2017-11-13 ENCOUNTER — Other Ambulatory Visit (INDEPENDENT_AMBULATORY_CARE_PROVIDER_SITE_OTHER): Payer: BLUE CROSS/BLUE SHIELD

## 2017-11-13 ENCOUNTER — Ambulatory Visit (INDEPENDENT_AMBULATORY_CARE_PROVIDER_SITE_OTHER): Payer: BLUE CROSS/BLUE SHIELD | Admitting: Internal Medicine

## 2017-11-13 VITALS — BP 130/90 | HR 76 | Ht 76.0 in | Wt 322.2 lb

## 2017-11-13 DIAGNOSIS — R5383 Other fatigue: Secondary | ICD-10-CM | POA: Diagnosis not present

## 2017-11-13 DIAGNOSIS — E669 Obesity, unspecified: Secondary | ICD-10-CM | POA: Diagnosis not present

## 2017-11-13 DIAGNOSIS — K649 Unspecified hemorrhoids: Secondary | ICD-10-CM | POA: Diagnosis not present

## 2017-11-13 LAB — CBC WITH DIFFERENTIAL/PLATELET
Basophils Absolute: 0.1 10*3/uL (ref 0.0–0.1)
Basophils Relative: 0.9 % (ref 0.0–3.0)
Eosinophils Absolute: 0.2 10*3/uL (ref 0.0–0.7)
Eosinophils Relative: 2.7 % (ref 0.0–5.0)
HCT: 42.2 % (ref 39.0–52.0)
HEMOGLOBIN: 14.5 g/dL (ref 13.0–17.0)
Lymphocytes Relative: 40 % (ref 12.0–46.0)
Lymphs Abs: 2.8 10*3/uL (ref 0.7–4.0)
MCHC: 34.4 g/dL (ref 30.0–36.0)
MCV: 91.9 fl (ref 78.0–100.0)
MONOS PCT: 10.3 % (ref 3.0–12.0)
Monocytes Absolute: 0.7 10*3/uL (ref 0.1–1.0)
NEUTROS PCT: 46.1 % (ref 43.0–77.0)
Neutro Abs: 3.3 10*3/uL (ref 1.4–7.7)
Platelets: 237 10*3/uL (ref 150.0–400.0)
RBC: 4.59 Mil/uL (ref 4.22–5.81)
RDW: 12.7 % (ref 11.5–15.5)
WBC: 7.1 10*3/uL (ref 4.0–10.5)

## 2017-11-13 NOTE — Patient Instructions (Signed)
  Your physician has requested that you go to the basement for the following lab work before leaving today: CBC/diff  We are referring you to the dietician and they will contact you about an appointment.   I appreciate the opportunity to care for you. Silvano Rusk, MD, [facility]

## 2017-11-13 NOTE — Progress Notes (Signed)
[name] 46 y.o. 1971-07-24 [id number]  Assessment & Plan:   Encounter Diagnoses  Name Primary?  . Bleeding hemorrhoids Yes  . Fatigue   . Obesity (BMI 30-39.9)      As best I can tell he has had bleeding from hemorrhoids that is resolved. I doubt he is anemic but he is concerned about the fatigue and we will check a CBC. He has requested and I will refer him to a dietitian regarding obesity and to try to lose weight.  He does continue to be fairly anxious about his health.  It is good that he is interested in trying to lose weight.  He has a significant amount of situational stress in his life with a large family and owning and running a restaurant business.  I think this affects his eating choices.  I appreciate the opportunity to care for this patient. CC: Melvin Carbon, MD   Subjective:   Chief Complaint: Rectal bleeding fatigue  HPI The patient is here last week he had several days of bright red blood per rectum and he saw some dark clots.  He says stools were darker but when specifically questioned I do not think they were melenic, in the days prior to this.  He had an upper endoscopy was mild duodenitis and a colonoscopy with 2 diminutive adenomas this summer. He says he is tired and fatigued.  He remains very busy with work and a large family. He is wondering if his vitamin D level needs to be rechecked. He denies any new heavy lifting coughing spells etc. things that would exacerbate or aggravate hemorrhoids.  He has not had any itching or protrusion from the rectum.  There is no rectal pain.  He has been on fluconazole for candidiasis in his groin, and says his wife heard that that could cause an ulcer or anemia. Allergies  Allergen Reactions  . No Known Allergies    Current Meds  Medication Sig  . albuterol (PROVENTIL HFA;VENTOLIN HFA) 108 (90 Base) MCG/ACT inhaler Inhale 2 puffs into the lungs every 4 (four) hours as needed for wheezing or shortness  of breath.  . fluconazole (DIFLUCAN) 150 MG tablet Take 1 tablet (150 mg total) once a week by mouth. As directed  . ketoconazole (NIZORAL) 2 % cream Apply 1 application 2 (two) times daily topically.  . ranitidine (ZANTAC) 150 MG tablet Take 1 tablet (150 mg total) by mouth 2 (two) times daily.   Past Medical History:  Diagnosis Date  . Complication of anesthesia 1996   ran a fever after sinus surgery.  In [place].  . Family history of adverse reaction to anesthesia    mother fever, chills  . Heart murmur   . Hyperlipidemia   . Mitral valve prolapse   . Personal history of colonic polyps = adenoma 01/24/2012   5 mm adenoma removed 01/2012 Carlean Purl   . Rectal bleeding    polyp removed 01/19/2012 pre canerous   Past Surgical History:  Procedure Laterality Date  . CHONDROPLASTY  02/27/2012   Procedure: CHONDROPLASTY;  Surgeon: Alta Corning, MD;  Location: [facility];  Service: Orthopedics;  Laterality: Right;  patella-femoral joint  . COLONOSCOPY    . KNEE ARTHROSCOPY Left 3/16  . KNEE ARTHROSCOPY Left 03/10/2017   Procedure: ARTHROSCOPY KNEE;  Surgeon: Dorna Leitz, MD;  Location: [facility];  Service: Orthopedics;  Laterality: Left;  . LASIK  2006   bilateral  . 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   family history includes Anesthesia problems in his mother; Arthritis in his son; Asthma in his unknown relative; Colitis in his father; Hypertension in his mother; Multiple sclerosis in his mother.   Review of Systems As per HPI  Objective:   Physical Exam BP 130/90   Pulse 76   Ht 6\' 4"  (1.93 m)   Wt (!) 322 lb 4 oz (146.2 kg)   BMI 39.23 kg/m   Obese NAD skin pink, palmar creases intact  Rectal - brown stool  Anoscopy inflamed Gr 1 int hemorrhoids all positions

## 2017-11-13 NOTE — Progress Notes (Signed)
Blood count is normal My [facility]rr[facility] note

## 2017-11-14 ENCOUNTER — Encounter: Payer: BLUE CROSS/BLUE SHIELD | Admitting: Neurology

## 2017-11-16 ENCOUNTER — Ambulatory Visit (INDEPENDENT_AMBULATORY_CARE_PROVIDER_SITE_OTHER): Payer: BLUE CROSS/BLUE SHIELD | Admitting: Neurology

## 2017-11-16 DIAGNOSIS — R202 Paresthesia of skin: Secondary | ICD-10-CM

## 2017-11-16 DIAGNOSIS — G5603 Carpal tunnel syndrome, bilateral upper limbs: Secondary | ICD-10-CM | POA: Diagnosis not present

## 2017-11-16 MED ORDER — AMBULATORY NON FORMULARY MEDICATION: 2.0000 [IU] | Freq: Every day | 0 refills | Status: DC

## 2017-11-16 MED ORDER — CYCLOBENZAPRINE HCL 5 MG PO TABS: 5.0000 mg | ORAL_TABLET | Freq: Three times a day (TID) | ORAL | 2 refills | Status: DC | PRN

## 2017-11-16 NOTE — Progress Notes (Signed)
    Follow-up Visit   Date: 11/16/17    Pardeep Pautz MRN: [id number] DOB: [birth year]   Interim History: Kalev Temme is a 46 y.o. right-handed man with hyperlipidemia, OSA, and RBBB  returning to the clinic for electrodiagnostic testing of the hands and complaints of left leg pain.   Left leg pain started about 1 week ago and describes it as achy starting in the proximal buttocks region and radiating towards the left knee. He has some associated low back pain. There is no weakness, numbness, or tingling.  He has tried meloxicam, with variable benefit.  Exam, there is no motor deficits in the legs. Reflexes are intact throughout.  Sensation intact.  Gait is mildly antalgic.  Electrodiagnostic testing today shows bilateral carpal tunnel syndrome, moderate in degree electrically. There is no evidence of a cervical radiculopathy.  IMPRESSION/PLAN:   1.  Bilateral carpal tunnel syndrome.  Recommend that he start wearing wrist splints bilaterally.  Avoid hyperflexion of the wrists.  2.  Left sciatica.  Start flexeril 5mg  at bedtime and continue meloxicam as needed.    Return to clinic in 3 months  Greater than 50% of this 15 minute visit was spent in counseling, explanation of diagnosis, planning of further management, and coordination of care.   Thank you for allowing me to participate in patient's care.  If I can answer any additional questions, I would be pleased to do so.    Sincerely,    Diana Davenport K. Posey Pronto, DO

## 2017-11-16 NOTE — Procedures (Signed)
[facility]  [address], [place]  [place], [place] [address] Tel: [phone] Fax:  [phone] Test Date:  11/16/2017  Patient: [name] DOB: 04/22/1971 Physician: Narda Amber, DO  Sex: Male Height: 6\' 2"  Ref Phys: Narda Amber, DO  ID#: [id number] Temp: 32.6C Technician:    Patient Complaints: This is a 46 year old gentleman referred for evaluation of bilateral arm pain and paresthesias.  NCV & EMG Findings: Extensive electrodiagnostic testing of the right upper extremity and additional studies of the left shows:  1. Right median sensory response shows prolonged distal peak latency (3.9 ms) and reduced amplitude (18.4 V).  Left median sensory response shows distal onset latency (4.1 ms) and low normal amplitude. Bilateral ulnar sensory responses are within normal limits. 2. Left median motor response shows prolonged latency (4.2 ms) and normal amplitude. Bilateral ulnar and right median motor responses are within normal limits. 3. There is no evidence of active or chronic motor axon loss changes affecting any of the tested muscles. Motor unit configuration and recruitment pattern is within normal limits.  Impression: Bilateral median neuropathy at or distal to the wrist, consistent with clinical diagnosis of carpal tunnel syndrome. Overall, these findings are moderate in degree electrically.   ___________________________ Narda Amber, DO    Nerve Conduction Studies Anti Sensory Summary Table   Site NR Peak (ms) Norm Peak (ms) P-T Amp (V) Norm P-T Amp  Left Median Anti Sensory (2nd Digit)  Wrist    4.2 <3.4 21.6 >20  Right Median Anti Sensory (2nd Digit)  Wrist    3.9 <3.4 18.4 >20  Left Ulnar Anti Sensory (5th Digit)  Wrist    3.0 <3.1 26.0 >12  Right Ulnar Anti Sensory (5th Digit)  Wrist    3.0 <3.1 19.8 >12   Motor Summary Table   Site NR Onset (ms) Norm Onset (ms) O-P Amp (mV) Norm O-P Amp Site1 Site2 Delta-0 (ms) Dist (cm) Vel (m/s) Norm  Vel (m/s)  Left Median Motor (Abd Poll Brev)  Wrist    4.1 <3.9 10.1 >6 Elbow Wrist 5.6 32.0 57 >50  Elbow    9.7  9.7         Right Median Motor (Abd Poll Brev)  Wrist    3.8 <3.9 10.9 >6 Elbow Wrist 5.7 34.0 60 >50  Elbow    9.5  10.4         Left Ulnar Motor (Abd Dig Minimi)  Wrist    2.9 <3.1 10.0 >7 B Elbow Wrist 4.4 26.5 60 >50  B Elbow    7.3  9.8  A Elbow B Elbow 1.5 10.0 67 >50  A Elbow    8.8  9.7         Right Ulnar Motor (Abd Dig Minimi)  Wrist    2.9 <3.1 9.3 >7 B Elbow Wrist 4.6 28.0 61 >50  B Elbow    7.5  9.0  A Elbow B Elbow 1.9 10.0 53 >50  A Elbow    9.4  8.8          EMG   Side Muscle Ins Act Fibs Psw Fasc Number Recrt Dur Dur. Amp Amp. Poly Poly. Comment  Right 1stDorInt Nml Nml Nml Nml Nml Nml Nml Nml Nml Nml Nml Nml N/A  Right Abd Poll Brev Nml Nml Nml Nml Nml Nml Nml Nml Nml Nml Nml Nml N/A  Right PronatorTeres Nml Nml Nml Nml Nml Nml Nml Nml Nml Nml Nml Nml N/A  Right Biceps  Nml Nml Nml Nml Nml Nml Nml Nml Nml Nml Nml Nml N/A  Right Triceps Nml Nml Nml Nml Nml Nml Nml Nml Nml Nml Nml Nml N/A  Right Deltoid Nml Nml Nml Nml Nml Nml Nml Nml Nml Nml Nml Nml N/A  Left 1stDorInt Nml Nml Nml Nml Nml Nml Nml Nml Nml Nml Nml Nml N/A  Left Abd Poll Brev Nml Nml Nml Nml Nml Nml Nml Nml Nml Nml Nml Nml N/A  Left PronatorTeres Nml Nml Nml Nml Nml Nml Nml Nml Nml Nml Nml Nml N/A  Left Biceps Nml Nml Nml Nml Nml Nml Nml Nml Nml Nml Nml Nml N/A  Left Triceps Nml Nml Nml Nml Nml Nml Nml Nml Nml Nml Nml Nml N/A  Left Deltoid Nml Nml Nml Nml Nml Nml Nml Nml Nml Nml Nml Nml N/A      Waveforms:

## 2017-11-20 ENCOUNTER — Ambulatory Visit: Payer: BLUE CROSS/BLUE SHIELD | Admitting: Dietician

## 2017-11-22 ENCOUNTER — Encounter: Payer: Self-pay | Admitting: Internal Medicine

## 2017-11-22 ENCOUNTER — Ambulatory Visit (INDEPENDENT_AMBULATORY_CARE_PROVIDER_SITE_OTHER): Payer: BLUE CROSS/BLUE SHIELD | Admitting: Internal Medicine

## 2017-11-22 VITALS — BP 132/82 | HR 104 | Temp 98.2°F | Wt 328.0 lb

## 2017-11-22 DIAGNOSIS — F419 Anxiety disorder, unspecified: Secondary | ICD-10-CM

## 2017-11-22 DIAGNOSIS — R21 Rash and other nonspecific skin eruption: Secondary | ICD-10-CM | POA: Diagnosis not present

## 2017-11-22 MED ORDER — DULOXETINE HCL 30 MG PO CPEP: 30.0000 mg | ORAL_CAPSULE | Freq: Every day | ORAL | 3 refills | Status: DC

## 2017-11-22 NOTE — Addendum Note (Signed)
Addended by: Ellamae Sia on: 11/22/2017 03:21 PM   Modules accepted: Orders

## 2017-11-22 NOTE — Progress Notes (Signed)
   Subjective:    Patient ID: [name], male    DOB: [birth year], 46 y.o.   MRN: [id number]  HPI Here due to anxiety and panic attacks  Panic mostly awakening him at night Not using CPAP---has nasal mask and trouble with the secretions Wonders if that is part of the problem  Still issues with wife She was concerned that his rash was STD--she still thinks he is unfaithful Some anxiety in the day also They still argue-- "I feel like I am never good enough no matter what I do" Gets episodic depressed mood also Business has been doing well--has been trying to get some time off (closed 1 day per week and doing some family responsibilities)  Pain in knees still Some pain in back of thigh also Tried muscle relaxer--not clearly helpful  Review of Systems  Having lots of mucus---seems to get better with exposure to cold air Rash in groin got better--came back when he stopped the cream CTS confirmed ---will be trying the braces Remains very fatigued    Objective:   Physical Exam  Constitutional: No distress.  Genitourinary:  Genitourinary Comments: Rash on penis is better Still moist  Psychiatric:  Normal appearance and speech No clear depression          Assessment & Plan:

## 2017-11-22 NOTE — Assessment & Plan Note (Signed)
Better with rx but doesn't go away Wife concerned about STD but not consistent with that

## 2017-11-22 NOTE — Assessment & Plan Note (Signed)
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

## 2017-11-22 NOTE — Patient Instructions (Signed)
Please try the new medication for your nerves. Let me know if you have any problems. You should work hard to use CPAP every night.

## 2017-11-23 ENCOUNTER — Encounter: Payer: Self-pay | Admitting: Internal Medicine

## 2017-11-23 LAB — C. TRACHOMATIS/N. GONORRHOEAE RNA
C. TRACHOMATIS RNA, TMA: NOT DETECTED
N. GONORRHOEAE RNA, TMA: NOT DETECTED

## 2017-11-23 LAB — HIV ANTIBODY (ROUTINE TESTING W REFLEX): HIV: NONREACTIVE

## 2017-11-23 LAB — RPR: RPR Ser Ql: NONREACTIVE

## 2017-11-29 ENCOUNTER — Encounter: Payer: Self-pay | Admitting: Internal Medicine

## 2017-12-01 ENCOUNTER — Telehealth: Payer: Self-pay | Admitting: *Deleted

## 2017-12-01 ENCOUNTER — Encounter: Payer: Self-pay | Admitting: Internal Medicine

## 2017-12-01 ENCOUNTER — Ambulatory Visit (INDEPENDENT_AMBULATORY_CARE_PROVIDER_SITE_OTHER): Payer: BLUE CROSS/BLUE SHIELD | Admitting: Internal Medicine

## 2017-12-01 VITALS — BP 128/82 | HR 92 | Temp 98.3°F | Wt 325.0 lb

## 2017-12-01 DIAGNOSIS — E559 Vitamin D deficiency, unspecified: Secondary | ICD-10-CM

## 2017-12-01 DIAGNOSIS — Z113 Encounter for screening for infections with a predominantly sexual mode of transmission: Secondary | ICD-10-CM | POA: Diagnosis not present

## 2017-12-01 NOTE — Telephone Encounter (Signed)
Copied from [name] [phone]. Topic: Appointment Scheduling - Same Day Appointment >> Dec 01, 2017 12:17 PM Aurelio Brash B wrote: Pt spoke with Webb Silversmith and his wife yesterday-  The wife called today asking if he could get in to see Rollene Fare (she states Rollene Fare know what this is about)  or if she could call in medication for him.  I do not see any availability on the schedule.  Wife wants Rollene Fare or her nurse to call back

## 2017-12-01 NOTE — Telephone Encounter (Signed)
I will be happy to see him. If he wants to come in today, I can add him on at 4:45, otherwise it will be next week.

## 2017-12-01 NOTE — Progress Notes (Signed)
Subjective:    Patient ID: [name], male    DOB: Mar 05, 1971, 46 y.o.   MRN: [id number]  HPI  Pt presents to the clinic today requesting testing for HSV. He reports he has had a lesion on his penis that he has been seeing his PCP for. He was diagnosed with yeast and treated with Diflucan and topical Ketoconazole. He has noticed minimal improvement since that time. His HIV, RPR, gonorrhea and trichomonas were negative. His wife broke out in lesions as well. She went to her GYN and tested positive for herpes. She is currently on Valtrex.   He reports he is also due to have his Vit D repeated today.   Review of Systems      Past Medical History:  Diagnosis Date  . Complication of anesthesia 1996   ran a fever after sinus surgery.  In [place].  . Family history of adverse reaction to anesthesia    mother fever, chills  . Heart murmur   . Hyperlipidemia   . Mitral valve prolapse   . Personal history of colonic polyps = adenoma 01/24/2012   5 mm adenoma removed 01/2012 Carlean Purl   . Rectal bleeding    polyp removed 01/19/2012 pre canerous    Current Outpatient Medications  Medication Sig Dispense Refill  . AMBULATORY NON FORMULARY MEDICATION 2 Units by Other route daily. Right wrist splint Left wrist splint 2 Units 0  . DULoxetine (CYMBALTA) 30 MG capsule Take 1 capsule (30 mg total) by mouth daily. 30 capsule 3  . fluconazole (DIFLUCAN) 150 MG tablet Take 1 tablet (150 mg total) once a week by mouth. As directed 4 tablet 5  . ketoconazole (NIZORAL) 2 % cream Apply 1 application 2 (two) times daily topically. 60 g 1  . ranitidine (ZANTAC) 150 MG tablet Take 1 tablet (150 mg total) by mouth 2 (two) times daily.     No current facility-administered medications for this visit.     Allergies  Allergen Reactions  . No Known Allergies     Family History  Problem Relation Age of Onset  . Multiple sclerosis Mother   . Anesthesia problems Mother   . Hypertension Mother   .  Colitis Father        questionable  . Asthma Unknown        children; seasonal  . Arthritis Son        juvinile rheumatoid  . Colon cancer Neg Hx     Social History   Socioeconomic History  . Marital status: Married    Spouse name: Not on file  . Number of children: 8  . Years of education: 55  . Highest education level: Not on file  Social Needs  . Financial resource strain: Not on file  . Food insecurity - worry: Not on file  . Food insecurity - inability: Not on file  . Transportation needs - medical: Not on file  . Transportation needs - non-medical: Not on file  Occupational History  . 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 Smoking status: Never Smoker  . Smokeless tobacco: Never Used  Substance and Sexual Activity  . Alcohol use: Yes    Alcohol/week: 0.0 oz    Comment: occasional beer/wine  . Drug use: No  . Sexual activity: Not on file  Other Topics Concern  . 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     Constitutional: Denies fever, malaise, fatigue, headache or abrupt weight changes.  GU: Denies urgency, frequency, pain with urination, burning sensation, blood in urine, odor or discharge. Skin: Pt reports history of penile lesion.    No other specific complaints in a complete review of systems (except as listed in HPI above).  Objective:   Physical Exam   BP 128/82   Pulse 92   Temp 98.3 F (36.8 C) (Oral)   Wt (!) 325 lb (147.4 kg)   SpO2 96%   BMI 39.56 kg/m  Wt Readings from Last 3 Encounters:  12/01/17 (!) 325 lb (147.4 kg)  11/22/17 (!) 328 lb (148.8 kg)  11/13/17 (!) 322 lb 4 oz (146.2 kg)    General: Appears his stated age, obese in NAD. Abdomen: Soft and nontender. Normal bowel sounds.  GU: Pt declined exam today.  BMET    Component Value Date/Time   NA 139 03/22/2017 0315    K 4.1 03/22/2017 0315   CL 105 03/22/2017 0315   CO2 25 03/22/2017 0315   GLUCOSE 102 (H) 03/22/2017 0315   BUN 18 03/22/2017 0315   CREATININE 0.99 03/22/2017 0315   CREATININE 1.10 07/12/2013 1512   CALCIUM 9.5 03/22/2017 0315   GFRNONAA >60 03/22/2017 0315   GFRAA >60 03/22/2017 0315    Lipid Panel     Component Value Date/Time   CHOL 199 05/03/2016 0923   TRIG 136.0 05/03/2016 0923   HDL 31.90 (L) 05/03/2016 0923   CHOLHDL 6 05/03/2016 0923   VLDL 27.2 05/03/2016 0923   LDLCALC 140 (H) 05/03/2016 0923    CBC    Component Value Date/Time   WBC 7.1 11/13/2017 1615   RBC 4.59 11/13/2017 1615   HGB 14.5 11/13/2017 1615   HCT 42.2 11/13/2017 1615   PLT 237.0 11/13/2017 1615   MCV 91.9 11/13/2017 1615   MCH 30.7 03/22/2017 0315   MCHC 34.4 11/13/2017 1615   RDW 12.7 11/13/2017 1615   LYMPHSABS 2.8 11/13/2017 1615   MONOABS 0.7 11/13/2017 1615   EOSABS 0.2 11/13/2017 1615   BASOSABS 0.1 11/13/2017 1615    Hgb A1C No results found for: HGBA1C         Assessment & Plan:   Exposure to STD:  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

## 2017-12-01 NOTE — Telephone Encounter (Signed)
Spoke to wife and pt has been added to schedule

## 2017-12-03 NOTE — Patient Instructions (Signed)

## 2017-12-04 ENCOUNTER — Other Ambulatory Visit: Payer: Self-pay | Admitting: Internal Medicine

## 2017-12-04 ENCOUNTER — Encounter: Payer: Self-pay | Admitting: Internal Medicine

## 2017-12-04 MED ORDER — VALACYCLOVIR HCL 1 G PO TABS: 1000.0000 mg | ORAL_TABLET | Freq: Two times a day (BID) | ORAL | 0 refills | Status: DC

## 2017-12-06 LAB — HSV(HERPES SIMPLEX VRS) I + II AB-IGG
HSV 1 IGG, TYPE SPEC: 5.38 {index} — AB
HSV 2 IGG,TYPE SPECIFIC AB: 9.8 index — ABNORMAL HIGH

## 2017-12-06 LAB — HSV 1/2 AB (IGM), IFA W/RFLX TITER
HSV 1 IGM SCREEN: NEGATIVE
HSV 2 IGM SCREEN: NEGATIVE

## 2017-12-06 LAB — VITAMIN D 25 HYDROXY (VIT D DEFICIENCY, FRACTURES): VIT D 25 HYDROXY: 26 ng/mL — AB (ref 30–100)

## 2017-12-11 ENCOUNTER — Ambulatory Visit: Payer: BLUE CROSS/BLUE SHIELD | Admitting: Dietician

## 2017-12-12 ENCOUNTER — Encounter: Payer: Self-pay | Admitting: Internal Medicine

## 2017-12-13 ENCOUNTER — Encounter: Payer: Self-pay | Admitting: Internal Medicine

## 2017-12-15 ENCOUNTER — Encounter: Payer: Self-pay | Admitting: Internal Medicine

## 2017-12-20 ENCOUNTER — Ambulatory Visit: Payer: BLUE CROSS/BLUE SHIELD | Admitting: Internal Medicine

## 2018-01-16 ENCOUNTER — Ambulatory Visit: Payer: BLUE CROSS/BLUE SHIELD | Admitting: Neurology

## 2018-01-16 ENCOUNTER — Encounter: Payer: Self-pay | Admitting: Neurology

## 2018-01-16 VITALS — BP 120/70 | HR 68 | Ht 76.0 in | Wt 329.4 lb

## 2018-01-16 DIAGNOSIS — G5603 Carpal tunnel syndrome, bilateral upper limbs: Secondary | ICD-10-CM

## 2018-01-16 NOTE — Patient Instructions (Signed)
Continue to wear your wrist splints  Return to clinic as needed
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
## 2018-01-17 ENCOUNTER — Other Ambulatory Visit: Payer: Self-pay | Admitting: Orthopedic Surgery

## 2018-01-17 DIAGNOSIS — M4800 Spinal stenosis, site unspecified: Secondary | ICD-10-CM

## 2018-01-17 DIAGNOSIS — M545 Low back pain: Secondary | ICD-10-CM

## 2018-01-21 ENCOUNTER — Ambulatory Visit
Admission: RE | Admit: 2018-01-21 | Discharge: 2018-01-21 | Disposition: A | Discharge status: Home / Self Care | Payer: BLUE CROSS/BLUE SHIELD | Source: Ambulatory Visit | Attending: Orthopedic Surgery | Admitting: Orthopedic Surgery

## 2018-01-21 ENCOUNTER — Other Ambulatory Visit: Payer: BLUE CROSS/BLUE SHIELD

## 2018-01-21 DIAGNOSIS — M545 Low back pain: Secondary | ICD-10-CM

## 2018-02-02 ENCOUNTER — Ambulatory Visit: Payer: BLUE CROSS/BLUE SHIELD | Admitting: Internal Medicine

## 2018-02-02 DIAGNOSIS — Z0289 Encounter for other administrative examinations: Secondary | ICD-10-CM

## 2018-02-10 ENCOUNTER — Other Ambulatory Visit: Payer: Self-pay | Admitting: Internal Medicine

## 2018-02-10 MED ORDER — MELOXICAM 15 MG PO TABS: 15.0000 mg | ORAL_TABLET | Freq: Every day | ORAL | 3 refills | Status: DC

## 2018-03-08 ENCOUNTER — Telehealth: Payer: Self-pay | Admitting: Internal Medicine

## 2018-03-08 NOTE — Telephone Encounter (Signed)
Spoke with pt who states he is wanting to buy a POC. Informed him that he could contact [employer] and buy one from them. Pt verbalized understanding. Nothing further needed.

## 2018-03-08 NOTE — Telephone Encounter (Signed)
Pt calling stating [place] just [place] and pt is wanting to get some advise from Dr Mortimer Fries   He states his dad has a prescription from over there and will get it translated but he is on 3 liters with warm water  He is calling to see if we can recommend a machine since there are so many out there Patient is asking if he can have Dr Mortimer Fries call as soon as he can   Please advise

## 2018-03-22 ENCOUNTER — Encounter: Payer: Self-pay | Admitting: Internal Medicine

## 2018-03-22 DIAGNOSIS — G8929 Other chronic pain: Secondary | ICD-10-CM

## 2018-03-22 DIAGNOSIS — M549 Dorsalgia, unspecified: Principal | ICD-10-CM

## 2018-04-15 ENCOUNTER — Other Ambulatory Visit: Payer: Self-pay | Admitting: Internal Medicine

## 2018-04-16 ENCOUNTER — Encounter: Payer: Self-pay | Admitting: Nurse Practitioner

## 2018-04-16 ENCOUNTER — Ambulatory Visit: Payer: BLUE CROSS/BLUE SHIELD | Attending: Nurse Practitioner | Admitting: Nurse Practitioner

## 2018-04-16 ENCOUNTER — Other Ambulatory Visit: Payer: Self-pay

## 2018-04-16 VITALS — BP 121/85 | HR 73 | Temp 97.9°F | Ht 76.0 in | Wt 330.0 lb

## 2018-04-16 DIAGNOSIS — Z79899 Other long term (current) drug therapy: Secondary | ICD-10-CM | POA: Diagnosis not present

## 2018-04-16 DIAGNOSIS — M5442 Lumbago with sciatica, left side: Secondary | ICD-10-CM | POA: Diagnosis not present

## 2018-04-16 DIAGNOSIS — M5441 Lumbago with sciatica, right side: Secondary | ICD-10-CM | POA: Diagnosis not present

## 2018-04-16 DIAGNOSIS — Z789 Other specified health status: Secondary | ICD-10-CM | POA: Insufficient documentation

## 2018-04-16 DIAGNOSIS — M899 Disorder of bone, unspecified: Secondary | ICD-10-CM | POA: Insufficient documentation

## 2018-04-16 DIAGNOSIS — E785 Hyperlipidemia, unspecified: Secondary | ICD-10-CM | POA: Insufficient documentation

## 2018-04-16 DIAGNOSIS — M79605 Pain in left leg: Secondary | ICD-10-CM | POA: Diagnosis not present

## 2018-04-16 DIAGNOSIS — G894 Chronic pain syndrome: Secondary | ICD-10-CM | POA: Insufficient documentation

## 2018-04-16 DIAGNOSIS — Z9889 Other specified postprocedural states: Secondary | ICD-10-CM | POA: Insufficient documentation

## 2018-04-16 DIAGNOSIS — M94262 Chondromalacia, left knee: Secondary | ICD-10-CM | POA: Diagnosis not present

## 2018-04-16 DIAGNOSIS — G8929 Other chronic pain: Secondary | ICD-10-CM | POA: Insufficient documentation

## 2018-04-16 DIAGNOSIS — Z79891 Long term (current) use of opiate analgesic: Secondary | ICD-10-CM | POA: Insufficient documentation

## 2018-04-16 DIAGNOSIS — M25562 Pain in left knee: Secondary | ICD-10-CM

## 2018-04-16 NOTE — Progress Notes (Signed)
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              Palpation: No palpable anomalies              Specialized Test(s): Deferred         Specialized Test(s): Deferred          Thoracic Spine Exam  Inspection: No masses, redness, or swelling Alignment: Symmetrical Functional ROM: Unrestricted ROM Stability: No instability detected Sensory: Unimpaired Muscle strength & Tone: No palpable anomalies  Lumbar Spine Exam  Inspection: No masses, redness, or swelling Alignment: Symmetrical Functional ROM: Unrestricted ROM      Stability: No instability detected Muscle strength & Tone: Functionally intact Sensory: Unimpaired Palpation: Complains of area being tender to palpation       Provocative Tests: Lumbar Hyperextension and rotation test:  Negative       Patrick's Maneuver: Negative                    Gait & Posture Assessment  Ambulation: Unassisted Gait: Relatively normal for age and body habitus Posture: WNL   Lower Extremity Exam    Side: Right lower extremity  Side: Left lower extremity  Inspection:crepitus  Inspection: crepitus  Functional ROM: Unrestricted ROM          Functional ROM: Unrestricted ROM          Muscle strength & Tone: Functionally intact  Muscle strength & Tone: Functionally intact  Sensory: Unimpaired  Sensory: Unimpaired  Palpation: Non-tender  Palpation: Non-tender   Assessment  Primary Diagnosis & Pertinent Problem List: The primary encounter diagnosis was Chronic pain of left knee (Primary Area of Pain). Diagnoses of Chronic pain of left lower extremity (Secondary Area of Pain), Chronic bilateral low back pain with left-sided sciatica [facility] Area of Pain), Chronic pain syndrome, Pharmacologic therapy, Disorder of skeletal system, Problems influencing health status, and Long term current use of opiate analgesic were also pertinent to this visit.  Visit Diagnosis: 1. Chronic pain of left knee (Primary Area of Pain)   2. Chronic pain of left lower extremity (Secondary Area of Pain)   3. Chronic bilateral low back pain with left-sided sciatica [facility] Area of Pain)   4. Chronic pain syndrome   5. Pharmacologic therapy   6. Disorder of skeletal system   7. Problems influencing health status   8. Long term current use of opiate analgesic    Plan of Care  Initial treatment plan:  Please be advised that as per protocol, today's visit has been an evaluation only. We have not taken over the patient's controlled substance management.  Problem-specific plan: No problem-specific Assessment & Plan notes found for this encounter.  Ordered Lab-work, Procedure(s), Referral(s), & Consult(s): Orders Placed This Encounter  Procedures  . Comp. Metabolic Panel (12)  . Magnesium  . Vitamin B12  .  Sedimentation rate  . C-reactive protein   Pharmacotherapy: Medications ordered:  No orders of the defined types were placed in this encounter.  Medications administered during this visit: Herminio Commons "Germain Osgood" had no medications administered during this visit.   Pharmacotherapy under consideration:  Opioid Analgesics: The patient was informed that there is no guarantee that he would be a candidate for opioid analgesics. The decision will be made following CDC guidelines. This decision will be based on the results of diagnostic studies, as well as Mr. Dady risk profile. Procedures only Membrane stabilizer: To be determined at a later time Muscle relaxant: To be determined at a later time NSAID: To be determined at a later time Other analgesic(s): To be determined at a later time   Interventional therapies under consideration: Mr. Wenzler was informed that there is no guarantee that he would be a candidate for interventional therapies. The decision will be based on the results of diagnostic studies, as well as Mr. Lazo risk profile.  Possible procedure(s): Diagnostic left intra-articular steroid injection Diagnostic bilateral Hyalgan series Diagnostic left knee genicular nerve block Possible left knee genicular RFA Diagnostic left LESI Diagnostic bilateral lumbar facet nerve block Diagnostic left transforaminal epidural steroid injection Possible bilateral lumbar facet RFA   Provider-requested follow-up: Return for 2nd Visit, w/ Dr. Dossie Arbour, [place] before return for second visit.  Future Appointments  Date Time Provider [place]  04/23/2018 11:30 AM Vevelyn Francois, NP [facility] None  04/24/2018  9:00 AM Venia Carbon, MD [facility]    Primary Care Physician: Venia Carbon, MD Location: [facility] Outpatient Pain Management Facility Note by:  Date: 04/16/2018; Time: 1:51 PM  Pain Score Disclaimer: We use the NRS-11 scale. This is a self-reported,  subjective measurement of pain severity with only modest accuracy. It is used primarily to identify changes within a particular patient. It must be  understood that outpatient pain scales are significantly less accurate that those used for research, where they can be applied under ideal controlled circumstances with minimal exposure to variables. In reality, the score is likely to be a combination of pain intensity and pain affect, where pain affect describes the degree of emotional arousal or changes in action readiness caused by the sensory experience of pain. Factors such as social and work situation, setting, emotional state, anxiety levels, expectation, and prior pain experience may influence pain perception and show large inter-individual differences that may also be affected by time variables.  Patient instructions provided during this appointment: Patient Instructions   ____________________________________________________________________________________________  Appointment Policy Summary  It is our goal and responsibility to provide the medical community with assistance in the evaluation and management of patients with chronic pain. Unfortunately our resources are limited. Because we do not have an unlimited amount of time, or available appointments, we are required to closely monitor and manage their use. The following rules exist to maximize their use:  Patient's responsibilities: 1. Punctuality:  At what time should I arrive? You should be physically present in our office 30 minutes before your scheduled appointment. Your scheduled appointment is with your assigned healthcare provider. However, it takes 5-10 minutes to be "checked-in", and another 15 minutes for the nurses to do the admission. If you arrive to our office at the time you were given for your appointment, you will end up being at least 20-25 minutes late to your appointment with the provider. 2. Tardiness:  What happens if I  arrive only a few minutes after my scheduled appointment time? You will need to reschedule your appointment. The cutoff is your appointment time. This is why it is so important that you arrive at least 30 minutes before that appointment. If you have an appointment scheduled for 10:00 AM and you arrive at 10:01, you will be required to reschedule your appointment.  3. Plan ahead:  Always assume that you will encounter traffic on your way in. Plan for it. If you are dependent on a driver, make sure they understand these rules and the need to arrive early. 4. Other appointments and responsibilities:  Avoid scheduling any other appointments before or after your pain clinic appointments.  5. Be prepared:  Write down everything that you need to discuss with your healthcare provider and give this information to the admitting nurse. Write down the medications that you will need refilled. Bring your pills and bottles (even the empty ones), to all of your appointments, except for those where a procedure is scheduled. 6. No children or pets:  Find someone to take care of them. It is not appropriate to bring them in. 7. Scheduling changes:  We request "advanced notification" of any changes or cancellations. 8. Advanced notification:  Defined as a time period of more than 24 hours prior to the originally scheduled appointment. This allows for the appointment to be offered to other patients. 9. Rescheduling:  When a visit is rescheduled, it will require the cancellation of the original appointment. For this reason they both fall within the category of "Cancellations".  10. Cancellations:  They require advanced notification. Any cancellation less than 24 hours before the  appointment will be recorded as a "No Show". 11. No Show:  Defined as an unkept appointment where the patient failed to notify or declare to the practice their intention or inability to keep the appointment.  Corrective process for repeat  offenders:  1. Tardiness: Three (3)  episodes of rescheduling due to late arrivals will be recorded as one (1) "No Show". 2. Cancellation or reschedule: Three (3) cancellations or rescheduling will be recorded as one (1) "No Show". 3. "No Shows": Three (3) "No Shows" within a 12 month period will result in discharge from the practice. ____________________________________________________________________________________________  ____________________________________________________________________________________________  Pain Scale  Introduction: The pain score used by this practice is the Verbal Numerical Rating Scale (VNRS-11). This is an 11-point scale. It is for adults and children 10 years or older. There are significant differences in how the pain score is reported, used, and applied. Forget everything you learned in the past and learn this scoring system.  General Information: The scale should reflect your current level of pain. Unless you are specifically asked for the level of your worst pain, or your average pain. If you are asked for one of these two, then it should be understood that it is over the past 24 hours.  Basic Activities of Daily Living (ADL): Personal hygiene, dressing, eating, transferring, and using restroom.  Instructions: Most patients tend to report their level of pain as a combination of two factors, their physical pain and their psychosocial pain. This last one is also known as "suffering" and it is reflection of how physical pain affects you socially and psychologically. From now on, report them separately. From this point on, when asked to report your pain level, report only your physical pain. Use the following table for reference.  Pain Clinic Pain Levels (0-5/10)  Pain Level Score  Description  No Pain 0   Mild pain 1 Nagging, annoying, but does not interfere with basic activities of daily living (ADL). Patients are able to eat, bathe, get dressed, toileting  (being able to get on and off the toilet and perform personal hygiene functions), transfer (move in and out of bed or a chair without assistance), and maintain continence (able to control bladder and bowel functions). Blood pressure and heart rate are unaffected. A normal heart rate for a healthy adult ranges from 60 to 100 bpm (beats per minute).   Mild to moderate pain 2 Noticeable and distracting. Impossible to hide from other people. More frequent flare-ups. Still possible to adapt and function close to normal. It can be very annoying and may have occasional stronger flare-ups. With discipline, patients may get used to it and adapt.   Moderate pain 3 Interferes significantly with activities of daily living (ADL). It becomes difficult to feed, bathe, get dressed, get on and off the toilet or to perform personal hygiene functions. Difficult to get in and out of bed or a chair without assistance. Very distracting. With effort, it can be ignored when deeply involved in activities.   Moderately severe pain 4 Impossible to ignore for more than a few minutes. With effort, patients may still be able to manage work or participate in some social activities. Very difficult to concentrate. Signs of autonomic nervous system discharge are evident: dilated pupils (mydriasis); mild sweating (diaphoresis); sleep interference. Heart rate becomes elevated (>115 bpm). Diastolic blood pressure (lower number) rises above 100 mmHg. Patients find relief in laying down and not moving.   Severe pain 5 Intense and extremely unpleasant. Associated with frowning face and frequent crying. Pain overwhelms the senses.  Ability to do any activity or maintain social relationships becomes significantly limited. Conversation becomes difficult. Pacing back and forth is common, as getting into a comfortable position is nearly impossible. Pain wakes you up from deep sleep. Physical signs will  be obvious: pupillary dilation; increased  sweating; goosebumps; brisk reflexes; cold, clammy hands and feet; nausea, vomiting or dry heaves; loss of appetite; significant sleep disturbance with inability to fall asleep or to remain asleep. When persistent, significant weight loss is observed due to the complete loss of appetite and sleep deprivation.  Blood pressure and heart rate becomes significantly elevated. Caution: If elevated blood pressure triggers a pounding headache associated with blurred vision, then the patient should immediately seek attention at an urgent or emergency care unit, as these may be signs of an impending stroke.    Emergency Department Pain Levels (6-10/10)  Emergency Room Pain 6 Severely limiting. Requires emergency care and should not be seen or managed at an outpatient pain management facility. Communication becomes difficult and requires great effort. Assistance to reach the emergency department may be required. Facial flushing and profuse sweating along with potentially dangerous increases in heart rate and blood pressure will be evident.   Distressing pain 7 Self-care is very difficult. Assistance is required to transport, or use restroom. Assistance to reach the emergency department will be required. Tasks requiring coordination, such as bathing and getting dressed become very difficult.   Disabling pain 8 Self-care is no longer possible. At this level, pain is disabling. The individual is unable to do even the most "basic" activities such as walking, eating, bathing, dressing, transferring to a bed, or toileting. Fine motor skills are lost. It is difficult to think clearly.   Incapacitating pain 9 Pain becomes incapacitating. Thought processing is no longer possible. Difficult to remember your own name. Control of movement and coordination are lost.   The worst pain imaginable 10 At this level, most patients pass out from pain. When this level is reached, collapse of the autonomic nervous system occurs,  leading to a sudden drop in blood pressure and heart rate. This in turn results in a temporary and dramatic drop in blood flow to the brain, leading to a loss of consciousness. Fainting is one of the body's self defense mechanisms. Passing out puts the brain in a calmed state and causes it to shut down for a while, in order to begin the healing process.    Summary: 1. Refer to this scale when providing [place] with your pain level. 2. Be accurate and careful when reporting your pain level. This will help with your care. 3. Over-reporting your pain level will lead to loss of credibility. 4. Even a level of 1/10 means that there is pain and will be treated at our facility. 5. High, inaccurate reporting will be documented as "Symptom Exaggeration", leading to loss of credibility and suspicions of possible secondary gains such as obtaining more narcotics, or wanting to appear disabled, for fraudulent reasons. 6. Only pain levels of 5 or below will be seen at our facility. 7. Pain levels of 6 and above will be sent to the Emergency Department and the appointment cancelled. ____________________________________________________________________________________________

## 2018-04-16 NOTE — Patient Instructions (Signed)

## 2018-04-17 LAB — COMP. METABOLIC PANEL (12)
A/G RATIO: 1.8 (ref 1.2–2.2)
ALBUMIN: 4.7 g/dL (ref 3.5–5.5)
AST: 36 IU/L (ref 0–40)
Alkaline Phosphatase: 60 IU/L (ref 39–117)
BILIRUBIN TOTAL: 0.5 mg/dL (ref 0.0–1.2)
BUN/Creatinine Ratio: 20 (ref 9–20)
BUN: 19 mg/dL (ref 6–24)
CHLORIDE: 102 mmol/L (ref 96–106)
Calcium: 9.7 mg/dL (ref 8.7–10.2)
Creatinine, Ser: 0.97 mg/dL (ref 0.76–1.27)
GFR calc Af Amer: 109 mL/min/{1.73_m2} (ref >59)
GFR calc non Af Amer: 94 mL/min/{1.73_m2} (ref >59)
GLOBULIN, TOTAL: 2.6 g/dL (ref 1.5–4.5)
Glucose: 100 mg/dL — ABNORMAL HIGH (ref 65–99)
POTASSIUM: 4.5 mmol/L (ref 3.5–5.2)
SODIUM: 139 mmol/L (ref 134–144)
Total Protein: 7.3 g/dL (ref 6.0–8.5)

## 2018-04-17 LAB — C-REACTIVE PROTEIN: CRP: 2.6 mg/L (ref 0.0–4.9)

## 2018-04-17 LAB — VITAMIN B12: VITAMIN B 12: 454 pg/mL (ref 232–1245)

## 2018-04-17 LAB — MAGNESIUM: Magnesium: 2 mg/dL (ref 1.6–2.3)

## 2018-04-17 LAB — SEDIMENTATION RATE: Sed Rate: 14 mm/hr (ref 0–15)

## 2018-04-23 ENCOUNTER — Ambulatory Visit: Payer: BLUE CROSS/BLUE SHIELD | Admitting: Nurse Practitioner

## 2018-04-24 ENCOUNTER — Encounter: Payer: BLUE CROSS/BLUE SHIELD | Admitting: Internal Medicine

## 2018-04-30 ENCOUNTER — Other Ambulatory Visit: Payer: Self-pay

## 2018-04-30 ENCOUNTER — Encounter: Payer: Self-pay | Admitting: Internal Medicine

## 2018-04-30 ENCOUNTER — Encounter: Payer: Self-pay | Admitting: Pain Medicine

## 2018-04-30 ENCOUNTER — Ambulatory Visit: Payer: BLUE CROSS/BLUE SHIELD | Attending: Nurse Practitioner | Admitting: Pain Medicine

## 2018-04-30 VITALS — BP 120/85 | HR 59 | Temp 98.0°F | Resp 16 | Ht 76.0 in | Wt 328.0 lb

## 2018-04-30 DIAGNOSIS — M48061 Spinal stenosis, lumbar region without neurogenic claudication: Secondary | ICD-10-CM | POA: Diagnosis not present

## 2018-04-30 DIAGNOSIS — E785 Hyperlipidemia, unspecified: Secondary | ICD-10-CM | POA: Insufficient documentation

## 2018-04-30 DIAGNOSIS — Z86010 Personal history of colonic polyps: Secondary | ICD-10-CM | POA: Diagnosis not present

## 2018-04-30 DIAGNOSIS — M545 Low back pain: Secondary | ICD-10-CM | POA: Diagnosis not present

## 2018-04-30 DIAGNOSIS — Z6839 Body mass index (BMI) 39.0-39.9, adult: Secondary | ICD-10-CM | POA: Insufficient documentation

## 2018-04-30 DIAGNOSIS — M5442 Lumbago with sciatica, left side: Secondary | ICD-10-CM

## 2018-04-30 DIAGNOSIS — M25562 Pain in left knee: Secondary | ICD-10-CM | POA: Diagnosis present

## 2018-04-30 DIAGNOSIS — G894 Chronic pain syndrome: Secondary | ICD-10-CM | POA: Insufficient documentation

## 2018-04-30 DIAGNOSIS — F419 Anxiety disorder, unspecified: Secondary | ICD-10-CM | POA: Diagnosis not present

## 2018-04-30 DIAGNOSIS — Z789 Other specified health status: Secondary | ICD-10-CM

## 2018-04-30 DIAGNOSIS — X58XXXS Exposure to other specified factors, sequela: Secondary | ICD-10-CM | POA: Insufficient documentation

## 2018-04-30 DIAGNOSIS — Z79891 Long term (current) use of opiate analgesic: Secondary | ICD-10-CM | POA: Diagnosis not present

## 2018-04-30 DIAGNOSIS — Z79899 Other long term (current) drug therapy: Secondary | ICD-10-CM | POA: Diagnosis not present

## 2018-04-30 DIAGNOSIS — M79605 Pain in left leg: Secondary | ICD-10-CM

## 2018-04-30 DIAGNOSIS — S83282S Other tear of lateral meniscus, current injury, left knee, sequela: Secondary | ICD-10-CM | POA: Insufficient documentation

## 2018-04-30 DIAGNOSIS — M5136 Other intervertebral disc degeneration, lumbar region: Secondary | ICD-10-CM | POA: Diagnosis not present

## 2018-04-30 DIAGNOSIS — S83242S Other tear of medial meniscus, current injury, left knee, sequela: Secondary | ICD-10-CM | POA: Diagnosis not present

## 2018-04-30 DIAGNOSIS — M94262 Chondromalacia, left knee: Secondary | ICD-10-CM | POA: Insufficient documentation

## 2018-04-30 DIAGNOSIS — N471 Phimosis: Secondary | ICD-10-CM

## 2018-04-30 DIAGNOSIS — G8929 Other chronic pain: Secondary | ICD-10-CM

## 2018-04-30 DIAGNOSIS — M899 Disorder of bone, unspecified: Secondary | ICD-10-CM

## 2018-04-30 MED ORDER — PREDNISONE 20 MG PO TABS: ORAL_TABLET | ORAL | 0 refills | Status: DC

## 2018-04-30 NOTE — Progress Notes (Signed)
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 RFA    PRN Procedures:   None at this time   Provider-requested follow-up: Return for Procedure (no sedation): (L) IA Knee inj. with Local + Steroid.  Future Appointments  Date Time Provider [place]  05/17/2018  7:45 AM Milinda Pointer, MD [facility] None  08/21/2018  9:00 AM Venia Carbon, MD [facility]    Primary Care Physician: Venia Carbon, MD Location: [facility] Facility Note by: Gaspar Cola, MD Date: 04/30/2018; Time: 8:02 AM

## 2018-04-30 NOTE — Progress Notes (Signed)
Safety precautions to be maintained throughout the outpatient stay will include: orient to surroundings, keep bed in low position, maintain call bell within reach at all times, provide assistance with transfer out of bed and ambulation.  

## 2018-04-30 NOTE — Patient Instructions (Addendum)
____________________________________________________________________________________________  Preparing for your procedure (without sedation)  Instructions: . Oral Intake: Do not eat or drink anything for at least 3 hours prior to your procedure. . Transportation: Unless otherwise stated by your physician, you may drive yourself after the procedure. . Blood Pressure Medicine: Take your blood pressure medicine with a sip of water the morning of the procedure. . Blood thinners:  . Diabetics on insulin: Notify the staff so that you can be scheduled 1st case in the morning. If your diabetes requires high dose insulin, take only  of your normal insulin dose the morning of the procedure and notify the staff that you have done so. . Preventing infections: Shower with an antibacterial soap the morning of your procedure.  . Build-up your immune system: Take 1000 mg of Vitamin C with every meal (3 times a day) the day prior to your procedure. Marland Kitchen Antibiotics: Inform the staff if you have a condition or reason that requires you to take antibiotics before dental procedures. . Pregnancy: If you are pregnant, call and cancel the procedure. . Sickness: If you have a cold, fever, or any active infections, call and cancel the procedure. . Arrival: You must be in the facility at least 30 minutes prior to your scheduled procedure. . Children: Do not bring any children with you. . Dress appropriately: Bring dark clothing that you would not mind if they get stained. . Valuables: Do not bring any jewelry or valuables.  Procedure appointments are reserved for interventional treatments only. Marland Kitchen No Prescription Refills. . No medication changes will be discussed during procedure appointments. . No disability issues will be discussed.  Remember:  Regular Business hours are:  Monday to Thursday 8:00 AM to 4:00 PM  Provider's Schedule: Milinda Pointer, MD:  Procedure days: Tuesday and Thursday 7:30 AM to 4:00  PM  Gillis Santa, MD:  Procedure days: Monday and Wednesday 7:30 AM to 4:00 PM ____________________________________________________________________________________________   Place knee pain patient instructions here. Pain Management Discharge Instructions  General Discharge Instructions :  If you need to reach your doctor call: Monday-Friday 8:00 am - 4:00 pm at [phone] or toll free [phone].  After clinic hours [phone] to have operator reach doctor.  Bring all of your medication bottles to all your appointments in the pain clinic.  To cancel or reschedule your appointment with Pain Management please remember to call 24 hours in advance to avoid a fee.  Refer to the educational materials which you have been given on: General Risks, I had my Procedure. Discharge Instructions, Post Sedation.  Post Procedure Instructions:  The drugs you were given will stay in your system until tomorrow, so for the next 24 hours you should not drive, make any legal decisions or drink any alcoholic beverages.  You may eat anything you prefer, but it is better to start with liquids then soups and crackers, and gradually work up to solid foods.  Please notify your doctor immediately if you have any unusual bleeding, trouble breathing or pain that is not related to your normal pain.  Depending on the type of procedure that was done, some parts of your body may feel week and/or numb.  This usually clears up by tonight or the next day.  Walk with the use of an assistive device or accompanied by an adult for the 24 hours.  You may use ice on the affected area for the first 24 hours.  Put ice in a Ziploc bag and cover with a towel and place  against area 15 minutes on 15 minutes off.  You may switch to heat after 24 hours.GENERAL RISKS AND COMPLICATIONS  What are the risk, side effects and possible complications? Generally speaking, most procedures are safe.  However, with any procedure there  are risks, side effects, and the possibility of complications.  The risks and complications are dependent upon the sites that are lesioned, or the type of nerve block to be performed.  The closer the procedure is to the spine, the more serious the risks are.  Great care is taken when placing the radio frequency needles, block needles or lesioning probes, but sometimes complications can occur. 1. Infection: Any time there is an injection through the skin, there is a risk of infection.  This is why sterile conditions are used for these blocks.  There are four possible types of infection. 1. Localized skin infection. 2. Central Nervous System Infection-This can be in the form of Meningitis, which can be deadly. 3. Epidural Infections-This can be in the form of an epidural abscess, which can cause pressure inside of the spine, causing compression of the spinal cord with subsequent paralysis. This would require an emergency surgery to decompress, and there are no guarantees that the patient would recover from the paralysis. 4. Discitis-This is an infection of the intervertebral discs.  It occurs in about 1% of discography procedures.  It is difficult to treat and it may lead to surgery.        2. Pain: the needles have to go through skin and soft tissues, will cause soreness.       3. Damage to internal structures:  The nerves to be lesioned may be near blood vessels or    other nerves which can be potentially damaged.       4. Bleeding: Bleeding is more common if the patient is taking blood thinners such as  aspirin, Coumadin, Ticiid, Plavix, etc., or if he/she have some genetic predisposition  such as hemophilia. Bleeding into the spinal canal can cause compression of the spinal  cord with subsequent paralysis.  This would require an emergency surgery to  decompress and there are no guarantees that the patient would recover from the  paralysis.       5. Pneumothorax:  Puncturing of a lung is a possibility,  every time a needle is introduced in  the area of the chest or upper back.  Pneumothorax refers to free air around the  collapsed lung(s), inside of the thoracic cavity (chest cavity).  Another two possible  complications related to a similar event would include: Hemothorax and Chylothorax.   These are variations of the Pneumothorax, where instead of air around the collapsed  lung(s), you may have blood or chyle, respectively.       6. Spinal headaches: They may occur with any procedures in the area of the spine.       7. Persistent CSF (Cerebro-Spinal Fluid) leakage: This is a rare problem, but may occur  with prolonged intrathecal or epidural catheters either due to the formation of a fistulous  track or a dural tear.       8. Nerve damage: By working so close to the spinal cord, there is always a possibility of  nerve damage, which could be as serious as a permanent spinal cord injury with  paralysis.       9. Death:  Although rare, severe deadly allergic reactions known as "Anaphylactic  reaction" can occur to any of the medications used.  10. Worsening of the symptoms:  We can always make thing worse.  What are the chances of something like this happening? Chances of any of this occuring are extremely low.  By statistics, you have more of a chance of getting killed in a motor vehicle accident: while driving to the hospital than any of the above occurring .  Nevertheless, you should be aware that they are possibilities.  In general, it is similar to taking a shower.  Everybody knows that you can slip, hit your head and get killed.  Does that mean that you should not shower again?  Nevertheless always keep in mind that statistics do not mean anything if you happen to be on the wrong side of them.  Even if a procedure has a 1 (one) in a 1,000,000 (million) chance of going wrong, it you happen to be that one..Also, keep in mind that by statistics, you have more of a chance of having something go wrong  when taking medications.  Who should not have this procedure? If you are on a blood thinning medication (e.g. Coumadin, Plavix, see list of "Blood Thinners"), or if you have an active infection going on, you should not have the procedure.  If you are taking any blood thinners, please inform your physician.  How should I prepare for this procedure?  Do not eat or drink anything at least six hours prior to the procedure.  Bring a driver with you .  It cannot be a taxi.  Come accompanied by an adult that can drive you back, and that is strong enough to help you if your legs get weak or numb from the local anesthetic.  Take all of your medicines the morning of the procedure with just enough water to swallow them.  If you have diabetes, make sure that you are scheduled to have your procedure done first thing in the morning, whenever possible.  If you have diabetes, take only half of your insulin dose and notify our nurse that you have done so as soon as you arrive at the clinic.  If you are diabetic, but only take blood sugar pills (oral hypoglycemic), then do not take them on the morning of your procedure.  You may take them after you have had the procedure.  Do not take aspirin or any aspirin-containing medications, at least eleven (11) days prior to the procedure.  They may prolong bleeding.  Wear loose fitting clothing that may be easy to take off and that you would not mind if it got stained with Betadine or blood.  Do not wear any jewelry or perfume  Remove any nail coloring.  It will interfere with some of our monitoring equipment.  NOTE: Remember that this is not meant to be interpreted as a complete list of all possible complications.  Unforeseen problems may occur.  BLOOD THINNERS The following drugs contain aspirin or other products, which can cause increased bleeding during surgery and should not be taken for 2 weeks prior to and 1 week after surgery.  If you should need take  something for relief of minor pain, you may take acetaminophen which is found in Tylenol,m Datril, Anacin-3 and Panadol. It is not blood thinner. The products listed below are.  Do not take any of the products listed below in addition to any listed on your instruction sheet.  A.P.C or A.P.C with Codeine Codeine Phosphate Capsules #3 Ibuprofen Ridaura  ABC compound Congesprin Imuran rimadil  Advil Cope Indocin Robaxisal  Alka-Seltzer Effervescent Pain Reliever and Antacid Coricidin or Coricidin-D  Indomethacin Rufen  Alka-Seltzer plus Cold Medicine Cosprin Ketoprofen S-A-C Tablets  Anacin Analgesic Tablets or Capsules Coumadin Korlgesic Salflex  Anacin Extra Strength Analgesic tablets or capsules CP-2 Tablets Lanoril Salicylate  Anaprox Cuprimine Capsules Levenox Salocol  Anexsia-D Dalteparin Magan Salsalate  Anodynos Darvon compound Magnesium Salicylate Sine-off  Ansaid Dasin Capsules Magsal Sodium Salicylate  Anturane Depen Capsules Marnal Soma  APF Arthritis pain formula Dewitt's Pills Measurin Stanback  Argesic Dia-Gesic Meclofenamic Sulfinpyrazone  Arthritis Bayer Timed Release Aspirin Diclofenac Meclomen Sulindac  Arthritis pain formula Anacin Dicumarol Medipren Supac  Analgesic (Safety coated) Arthralgen Diffunasal Mefanamic Suprofen  Arthritis Strength Bufferin Dihydrocodeine Mepro Compound Suprol  Arthropan liquid Dopirydamole Methcarbomol with Aspirin Synalgos  ASA tablets/Enseals Disalcid Micrainin Tagament  Ascriptin Doan's Midol Talwin  Ascriptin A/D Dolene Mobidin Tanderil  Ascriptin Extra Strength Dolobid Moblgesic Ticlid  Ascriptin with Codeine Doloprin or Doloprin with Codeine Momentum Tolectin  Asperbuf Duoprin Mono-gesic Trendar  Aspergum Duradyne Motrin or Motrin IB Triminicin  Aspirin plain, buffered or enteric coated Durasal Myochrisine Trigesic  Aspirin Suppositories Easprin Nalfon Trillsate  Aspirin with Codeine Ecotrin Regular or Extra Strength Naprosyn Uracel   Atromid-S Efficin Naproxen Ursinus  Auranofin Capsules Elmiron Neocylate Vanquish  Axotal Emagrin Norgesic Verin  Azathioprine Empirin or Empirin with Codeine Normiflo Vitamin E  Azolid Emprazil Nuprin Voltaren  Bayer Aspirin plain, buffered or children's or timed BC Tablets or powders Encaprin Orgaran Warfarin Sodium  Buff-a-Comp Enoxaparin Orudis Zorpin  Buff-a-Comp with Codeine Equegesic Os-Cal-Gesic   Buffaprin Excedrin plain, buffered or Extra Strength Oxalid   Bufferin Arthritis Strength Feldene Oxphenbutazone   Bufferin plain or Extra Strength Feldene Capsules Oxycodone with Aspirin   Bufferin with Codeine Fenoprofen Fenoprofen Pabalate or Pabalate-SF   Buffets II Flogesic Panagesic   Buffinol plain or Extra Strength Florinal or Florinal with Codeine Panwarfarin   Buf-Tabs Flurbiprofen Penicillamine   Butalbital Compound Four-way cold tablets Penicillin   Butazolidin Fragmin Pepto-Bismol   Carbenicillin Geminisyn Percodan   Carna Arthritis Reliever Geopen Persantine   Carprofen Gold's salt Persistin   Chloramphenicol Goody's Phenylbutazone   Chloromycetin Haltrain Piroxlcam   Clmetidine heparin Plaquenil   Cllnoril Hyco-pap Ponstel   Clofibrate Hydroxy chloroquine Propoxyphen         Before stopping any of these medications, be sure to consult the physician who ordered them.  Some, such as Coumadin (Warfarin) are ordered to prevent or treat serious conditions such as "deep thrombosis", "pumonary embolisms", and other heart problems.  The amount of time that you may need off of the medication may also vary with the medication and the reason for which you were taking it.  If you are taking any of these medications, please make sure you notify your pain physician before you undergo any procedures.         ____________________________________________________________________________________________  Preparing for your procedure (without sedation)  Instructions: . Oral  Intake: Do not eat or drink anything for at least 3 hours prior to your procedure. . Transportation: Unless otherwise stated by your physician, you may drive yourself after the procedure. . Blood Pressure Medicine: Take your blood pressure medicine with a sip of water the morning of the procedure. . Blood thinners:  . Diabetics on insulin: Notify the staff so that you can be scheduled 1st case in the morning. If your diabetes requires high dose insulin, take only  of your normal insulin dose the morning of the procedure and notify the staff that you have  done so. . Preventing infections: Shower with an antibacterial soap the morning of your procedure.  . Build-up your immune system: Take 1000 mg of Vitamin C with every meal (3 times a day) the day prior to your procedure. Marland Kitchen Antibiotics: Inform the staff if you have a condition or reason that requires you to take antibiotics before dental procedures. . Pregnancy: If you are pregnant, call and cancel the procedure. . Sickness: If you have a cold, fever, or any active infections, call and cancel the procedure. . Arrival: You must be in the facility at least 30 minutes prior to your scheduled procedure. . Children: Do not bring any children with you. . Dress appropriately: Bring dark clothing that you would not mind if they get stained. . Valuables: Do not bring any jewelry or valuables.  Procedure appointments are reserved for interventional treatments only. Marland Kitchen No Prescription Refills. . No medication changes will be discussed during procedure appointments. . No disability issues will be discussed.  Remember:  Regular Business hours are:  Monday to Thursday 8:00 AM to 4:00 PM  Provider's Schedule: Milinda Pointer, MD:  Procedure days: Tuesday and Thursday 7:30 AM to 4:00 PM  Gillis Santa, MD:  Procedure days: Monday and Wednesday 7:30 AM to 4:00  PM ____________________________________________________________________________________________

## 2018-05-08 ENCOUNTER — Telehealth: Payer: Self-pay | Admitting: Internal Medicine

## 2018-05-08 DIAGNOSIS — M2550 Pain in unspecified joint: Secondary | ICD-10-CM

## 2018-05-08 NOTE — Telephone Encounter (Signed)
Ongoing joint pain Requests referral to Dr Earl Lagos go ahead

## 2018-05-17 ENCOUNTER — Ambulatory Visit: Payer: BLUE CROSS/BLUE SHIELD | Admitting: Pain Medicine

## 2018-05-21 ENCOUNTER — Telehealth: Payer: Self-pay

## 2018-05-21 NOTE — Telephone Encounter (Signed)
I just wanted to let you know that Dr Amil Amen declined referral for the patient and did not give a reason why per Anderson Malta at their office. Patient notified of this and he will talk to his wife and will call me back about this.-Anasofia Micallef V Shannin Naab, RMA

## 2018-05-21 NOTE — Telephone Encounter (Signed)
Okay He has had other visits----not sure how much this is rheumatology as opposed to orthopedic (maybe that is why he declined the referral)

## 2018-05-31 ENCOUNTER — Other Ambulatory Visit: Payer: Self-pay | Admitting: Orthopaedic Surgery

## 2018-05-31 DIAGNOSIS — M25562 Pain in left knee: Secondary | ICD-10-CM

## 2018-06-05 ENCOUNTER — Ambulatory Visit
Admission: RE | Admit: 2018-06-05 | Discharge: 2018-06-05 | Disposition: A | Discharge status: Home / Self Care | Payer: BLUE CROSS/BLUE SHIELD | Source: Ambulatory Visit | Attending: Orthopaedic Surgery | Admitting: Orthopaedic Surgery

## 2018-06-05 DIAGNOSIS — M25562 Pain in left knee: Secondary | ICD-10-CM

## 2018-06-08 ENCOUNTER — Other Ambulatory Visit: Payer: Self-pay | Admitting: Orthopaedic Surgery

## 2018-06-11 ENCOUNTER — Encounter (HOSPITAL_COMMUNITY): Payer: Self-pay | Admitting: *Deleted

## 2018-06-11 ENCOUNTER — Other Ambulatory Visit: Payer: Self-pay

## 2018-06-11 MED ORDER — DEXTROSE 5 % IV SOLN
3.0000 g | INTRAVENOUS | Status: AC
  Administered 2018-06-12: 3 g via INTRAVENOUS
  Filled 2018-06-11: qty 3

## 2018-06-11 NOTE — Progress Notes (Signed)
Mr Abel denies chest pain or shortness of breath.  Patient saw a cardiologist in 2017 for chest pain , was told to follow up as needed. Patient states that he was under a lot of stress at that time and was drinking a lot of caffeine. "I stop drinking soda and have not had any more chest pain." Mr Lillette Boxer has sleep apnea, "I haven't worn CPAP lately due to busy schedule."  I encourage patient to wear CPAP post op. I forwarded chart to PA/ NP- C to review chest x-ray 2018

## 2018-06-11 NOTE — H&P (Signed)
[name] is an 47 y.o. male.   Chief Complaint: Right Heel pain HPI: [name] is in again today about his right heel.  He has been treated conservatively at his heel for a long time and actually is 3 years out from a successful left heel pump bump surgery done by Dr. Berenice Primas.     Radiographs:  X-rays that were ordered, performed, and interpreted by me today included 2 views of the right heel show he has a prominent pump bump with a large Achilles insertional spur.    Past Medical History:  Diagnosis Date  . Carpal tunnel syndrome    bileteral - "Minor"  . Complication of anesthesia 1996   ran a fever after sinus surgery.  In [place].  . Family history of adverse reaction to anesthesia   . Heart murmur   . Hyperlipidemia   . Mitral valve prolapse   . Personal history of colonic polyps = adenoma 01/24/2012   5 mm adenoma removed 01/2012 Carlean Purl   . Rectal bleeding    polyp removed 01/19/2012 pre canerous  . Sleep apnea     Past Surgical History:  Procedure Laterality Date  . CHONDROPLASTY  02/27/2012   Procedure: CHONDROPLASTY;  Surgeon: Alta Corning, MD;  Location: [place];  Service: Orthopedics;  Laterality: Right;  patella-femoral joint  . COLONOSCOPY    . HEEL SPUR SURGERY Left 2016  . KNEE ARTHROSCOPY Left 3/16  . KNEE ARTHROSCOPY Left 03/10/2017   Procedure: ARTHROSCOPY KNEE;  Surgeon: Dorna Leitz, MD;  Location: [place];  Service: Orthopedics;  Laterality: Left;  . LASIK  2006   bilateral  . NASAL TURBINATE REDUCTION  1996    Family History  Problem Relation Age of Onset  . Multiple sclerosis Mother   . Anesthesia problems Mother   . Hypertension Mother   . Colitis Father        questionable  . Asthma Unknown        children; seasonal  . Arthritis Son        juvinile rheumatoid  . Colon cancer Neg Hx    Social History:  reports that he has never smoked. He has never used smokeless tobacco. He reports that he drinks alcohol. He reports that he does not use  drugs.  Allergies: No Known Allergies  No medications prior to admission.    No results found for this or any previous visit (from the past 48 hour(s)). No results found.  Review of Systems  Musculoskeletal: Positive for joint pain.       Right heel  All other systems reviewed and are negative.   There were no vitals taken for this visit. Physical Exam  Constitutional: He is oriented to person, place, and time. He appears well-developed and well-nourished.  HENT:  Head: Normocephalic and atraumatic.  Eyes: Pupils are equal, round, and reactive to light.  Neck: Normal range of motion.  Cardiovascular: Normal rate and regular rhythm.  Respiratory: Effort normal.  GI: Soft.  Musculoskeletal:  At the right heel he has pain both over the pump bump and the Achilles insertion.  His motion is nearly full.  Calf is soft and nontender.  He has normal unlabored respirations.    Neurological: He is alert and oriented to person, place, and time.  Skin: Skin is warm and dry.  Psychiatric: He has a normal mood and affect. His behavior is normal. Judgment and thought content normal.     Assessment/Plan Assessment: Right heel pump bump and  insertional spur  Plan: In terms of his heel he has basically the same thing he had on the opposite side and would like the same surgery. I reviewed risk of anesthesia and infection and wound healing issues. He is in agreement and would like to proceede I told him he will be nonweightbearing for at least a month and then walk in a boot for an additional month thereafter.  Alexie Samson, Larwance Sachs, PA-C 06/11/2018, 5:41 PM

## 2018-06-11 NOTE — Progress Notes (Signed)
Anesthesia Chart Review:  Pt is a same day work up   Case:  [id number] Date/Time:  06/12/18 1225   Procedure:  RIGHT ACHILLES TENDON REPAIR AND EXCISION OF HEEL SPUR (Right )   Anesthesia type:  General   Pre-op diagnosis:  RIGHT ACHILLIES SPUR AND PUMP BUMP   Location:  [place] OR ROOM 08 / [place] OR   Surgeon:  Melrose Nakayama, MD      DISCUSSION: - Pt is a 47 year old male with hx MVP, OSA  - Anesthesia history: Pt reports having an increased temperature after sinus surgery in 1996 in [place].  Pt also reports his mother had an increased temp and chills >30 years ago after c-section.  Anesthesia record from 03/10/17 in Epic documents pt received sevoflurane.    PROVIDERS: PCP is Venia Carbon, MD  Saw cardiologist Ida Rogue, MD 10/10/16 for eval chest pain.  Stress test ordered, normal results below.  F/u prn recommended.    LABS: Will be obtained day of surgery   EKG: Will be obtained day of surgery   CV:  Nuclear stress test 09/10/14:  1. Exercise myocardial perfusion study with no significant ischemia. 2. No significant wall motion abnormality noted.   Past Medical History:  Diagnosis Date  . Carpal tunnel syndrome    bileteral - "Minor"  . Complication of anesthesia 1996   ran a fever after sinus surgery.  In [place].  . Family history of adverse reaction to anesthesia   . Heart murmur   . Hyperlipidemia   . Mitral valve prolapse   . Personal history of colonic polyps = adenoma 01/24/2012   5 mm adenoma removed 01/2012 Carlean Purl   . Rectal bleeding    polyp removed 01/19/2012 pre canerous  . Sleep apnea     Past Surgical History:  Procedure Laterality Date  . CHONDROPLASTY  02/27/2012   Procedure: CHONDROPLASTY;  Surgeon: Alta Corning, MD;  Location: [place];  Service: Orthopedics;  Laterality: Right;  patella-femoral joint  . COLONOSCOPY    . HEEL SPUR SURGERY Left 2016  . KNEE ARTHROSCOPY Left 3/16  . KNEE ARTHROSCOPY Left 03/10/2017   Procedure: ARTHROSCOPY  KNEE;  Surgeon: Dorna Leitz, MD;  Location: [place];  Service: Orthopedics;  Laterality: Left;  . LASIK  2006   bilateral  . NASAL TURBINATE REDUCTION  1996    MEDICATIONS: No current facility-administered medications for this encounter.    Marland Kitchen ibuprofen (ADVIL,MOTRIN) 200 MG tablet  . Vitamin D, Ergocalciferol, (DRISDOL) 50000 units CAPS capsule  . predniSONE (DELTASONE) 20 MG tablet  . ranitidine (ZANTAC) 150 MG tablet    If labs and EKG acceptable day of surgery, I anticipate pt can proceed with surgery as scheduled.   Willeen Cass, FNP-BC [facility]Anesthesiology Phone: [phone] 06/11/2018 10:54 AM

## 2018-06-12 ENCOUNTER — Ambulatory Visit (HOSPITAL_COMMUNITY): Payer: BLUE CROSS/BLUE SHIELD | Admitting: Emergency Medicine

## 2018-06-12 ENCOUNTER — Encounter (HOSPITAL_COMMUNITY): Payer: Self-pay | Admitting: Certified Registered Nurse Anesthetist

## 2018-06-12 ENCOUNTER — Ambulatory Visit (HOSPITAL_COMMUNITY)
Admission: RE | Admit: 2018-06-12 | Discharge: 2018-06-12 | Disposition: A | Discharge status: Home / Self Care | Payer: BLUE CROSS/BLUE SHIELD | Source: Ambulatory Visit | Attending: Orthopaedic Surgery | Admitting: Orthopaedic Surgery

## 2018-06-12 ENCOUNTER — Encounter (HOSPITAL_COMMUNITY)
Admission: RE | Disposition: A | Discharge status: Home / Self Care | Payer: Self-pay | Source: Ambulatory Visit | Attending: Orthopaedic Surgery

## 2018-06-12 DIAGNOSIS — Y929 Unspecified place or not applicable: Secondary | ICD-10-CM | POA: Insufficient documentation

## 2018-06-12 DIAGNOSIS — I10 Essential (primary) hypertension: Secondary | ICD-10-CM | POA: Insufficient documentation

## 2018-06-12 DIAGNOSIS — K219 Gastro-esophageal reflux disease without esophagitis: Secondary | ICD-10-CM | POA: Diagnosis not present

## 2018-06-12 DIAGNOSIS — M7731 Calcaneal spur, right foot: Secondary | ICD-10-CM | POA: Insufficient documentation

## 2018-06-12 DIAGNOSIS — Z79899 Other long term (current) drug therapy: Secondary | ICD-10-CM | POA: Insufficient documentation

## 2018-06-12 DIAGNOSIS — Z6838 Body mass index (BMI) 38.0-38.9, adult: Secondary | ICD-10-CM | POA: Insufficient documentation

## 2018-06-12 DIAGNOSIS — G4730 Sleep apnea, unspecified: Secondary | ICD-10-CM | POA: Insufficient documentation

## 2018-06-12 DIAGNOSIS — X58XXXA Exposure to other specified factors, initial encounter: Secondary | ICD-10-CM | POA: Diagnosis not present

## 2018-06-12 DIAGNOSIS — E785 Hyperlipidemia, unspecified: Secondary | ICD-10-CM | POA: Insufficient documentation

## 2018-06-12 DIAGNOSIS — S86011A Strain of right Achilles tendon, initial encounter: Secondary | ICD-10-CM | POA: Insufficient documentation

## 2018-06-12 HISTORY — DX: Sleep apnea, unspecified: G47.30

## 2018-06-12 HISTORY — PX: ACHILLES TENDON SURGERY: SHX542

## 2018-06-12 HISTORY — DX: Carpal tunnel syndrome, unspecified upper limb: G56.00

## 2018-06-12 LAB — CBC
HCT: 44.6 % (ref 39.0–52.0)
Hemoglobin: 15.1 g/dL (ref 13.0–17.0)
MCH: 30.3 pg (ref 26.0–34.0)
MCHC: 33.9 g/dL (ref 30.0–36.0)
MCV: 89.4 fL (ref 78.0–100.0)
PLATELETS: 207 10*3/uL (ref 150–400)
RBC: 4.99 MIL/uL (ref 4.22–5.81)
RDW: 12 % (ref 11.5–15.5)
WBC: 4.6 10*3/uL (ref 4.0–10.5)

## 2018-06-12 SURGERY — REPAIR, TENDON, ACHILLES
Anesthesia: General | Site: Ankle | Laterality: Right

## 2018-06-12 MED ORDER — PROPOFOL 10 MG/ML IV BOLUS
INTRAVENOUS | Status: DC | PRN
  Administered 2018-06-12: 20 mg via INTRAVENOUS
  Administered 2018-06-12: 30 mg via INTRAVENOUS
  Administered 2018-06-12: 20 mg via INTRAVENOUS
  Administered 2018-06-12: 200 mg via INTRAVENOUS

## 2018-06-12 MED ORDER — KETOROLAC TROMETHAMINE 30 MG/ML IJ SOLN
INTRAMUSCULAR | Status: AC
  Filled 2018-06-12: qty 1

## 2018-06-12 MED ORDER — LACTATED RINGERS IV SOLN
INTRAVENOUS | Status: DC
  Administered 2018-06-12 (×2): via INTRAVENOUS

## 2018-06-12 MED ORDER — SUGAMMADEX SODIUM 200 MG/2ML IV SOLN
INTRAVENOUS | Status: DC | PRN
  Administered 2018-06-12: 290.4 mg via INTRAVENOUS

## 2018-06-12 MED ORDER — PROMETHAZINE HCL 25 MG/ML IJ SOLN: 6.2500 mg | INTRAMUSCULAR | Status: DC | PRN

## 2018-06-12 MED ORDER — LIDOCAINE 2% (20 MG/ML) 5 ML SYRINGE
INTRAMUSCULAR | Status: AC
  Filled 2018-06-12: qty 5

## 2018-06-12 MED ORDER — DEXAMETHASONE SODIUM PHOSPHATE 10 MG/ML IJ SOLN
INTRAMUSCULAR | Status: AC
  Filled 2018-06-12: qty 1

## 2018-06-12 MED ORDER — ROCURONIUM BROMIDE 10 MG/ML (PF) SYRINGE
PREFILLED_SYRINGE | INTRAVENOUS | Status: DC | PRN
  Administered 2018-06-12: 50 mg via INTRAVENOUS
  Administered 2018-06-12: 10 mg via INTRAVENOUS

## 2018-06-12 MED ORDER — MEPERIDINE HCL 50 MG/ML IJ SOLN: 6.2500 mg | INTRAMUSCULAR | Status: DC | PRN

## 2018-06-12 MED ORDER — FENTANYL CITRATE (PF) 250 MCG/5ML IJ SOLN
INTRAMUSCULAR | Status: AC
  Filled 2018-06-12: qty 5

## 2018-06-12 MED ORDER — CHLORHEXIDINE GLUCONATE 4 % EX LIQD: 60.0000 mL | Freq: Once | CUTANEOUS | Status: DC

## 2018-06-12 MED ORDER — DEXAMETHASONE SODIUM PHOSPHATE 10 MG/ML IJ SOLN
INTRAMUSCULAR | Status: DC | PRN
  Administered 2018-06-12: 10 mg via INTRAVENOUS

## 2018-06-12 MED ORDER — MIDAZOLAM HCL 2 MG/2ML IJ SOLN
INTRAMUSCULAR | Status: AC
  Administered 2018-06-12: 2 mg
  Filled 2018-06-12: qty 2

## 2018-06-12 MED ORDER — ONDANSETRON HCL 4 MG/2ML IJ SOLN
INTRAMUSCULAR | Status: DC | PRN
  Administered 2018-06-12: 4 mg via INTRAVENOUS

## 2018-06-12 MED ORDER — ONDANSETRON HCL 4 MG/2ML IJ SOLN
INTRAMUSCULAR | Status: AC
  Filled 2018-06-12: qty 2

## 2018-06-12 MED ORDER — MIDAZOLAM HCL 2 MG/2ML IJ SOLN: 0.5000 mg | Freq: Once | INTRAMUSCULAR | Status: DC | PRN

## 2018-06-12 MED ORDER — HYDROMORPHONE HCL 1 MG/ML IJ SOLN: 0.2500 mg | INTRAMUSCULAR | Status: DC | PRN

## 2018-06-12 MED ORDER — BUPIVACAINE HCL (PF) 0.5 % IJ SOLN
INTRAMUSCULAR | Status: AC
  Filled 2018-06-12: qty 30

## 2018-06-12 MED ORDER — FENTANYL CITRATE (PF) 100 MCG/2ML IJ SOLN
INTRAMUSCULAR | Status: AC
  Administered 2018-06-12: 100 ug
  Filled 2018-06-12: qty 2

## 2018-06-12 MED ORDER — HYDROCODONE-ACETAMINOPHEN 5-325 MG PO TABS: 1.0000 | ORAL_TABLET | Freq: Four times a day (QID) | ORAL | 0 refills | Status: DC | PRN

## 2018-06-12 MED ORDER — BUPIVACAINE HCL (PF) 0.5 % IJ SOLN
INTRAMUSCULAR | Status: DC | PRN
  Administered 2018-06-12: 10 mL

## 2018-06-12 MED ORDER — FENTANYL CITRATE (PF) 250 MCG/5ML IJ SOLN
INTRAMUSCULAR | Status: DC | PRN
  Administered 2018-06-12: 50 ug via INTRAVENOUS
  Administered 2018-06-12: 100 ug via INTRAVENOUS

## 2018-06-12 MED ORDER — MIDAZOLAM HCL 2 MG/2ML IJ SOLN
INTRAMUSCULAR | Status: AC
  Filled 2018-06-12: qty 2

## 2018-06-12 MED ORDER — PROPOFOL 10 MG/ML IV BOLUS
INTRAVENOUS | Status: AC
  Filled 2018-06-12: qty 20

## 2018-06-12 MED ORDER — BUPIVACAINE-EPINEPHRINE (PF) 0.5% -1:200000 IJ SOLN
INTRAMUSCULAR | Status: DC | PRN
  Administered 2018-06-12: 30 mL via PERINEURAL

## 2018-06-12 MED ORDER — SUGAMMADEX SODIUM 500 MG/5ML IV SOLN
INTRAVENOUS | Status: AC
  Filled 2018-06-12: qty 5

## 2018-06-12 MED ORDER — LIDOCAINE 2% (20 MG/ML) 5 ML SYRINGE
INTRAMUSCULAR | Status: DC | PRN
  Administered 2018-06-12: 100 mg via INTRAVENOUS

## 2018-06-12 MED ORDER — KETOROLAC TROMETHAMINE 30 MG/ML IJ SOLN
INTRAMUSCULAR | Status: DC | PRN
  Administered 2018-06-12: 30 mg via INTRAVENOUS

## 2018-06-12 SURGICAL SUPPLY — 66 items
ANCH SUT 2 2.9 2 LD TPR NDL (Anchor) ×2 IMPLANT
ANCH SUT KNTLS STRL SHLDR SYS (Anchor) ×2 IMPLANT
ANCHOR JUGGERKNOT WTAP NDL 2.9 (Anchor) ×2 IMPLANT
ANCHOR SUT QUATTRO KNTLS 4.5 (Anchor) ×2 IMPLANT
APL SKNCLS STERI-STRIP NONHPOA (GAUZE/BANDAGES/DRESSINGS) ×1
BANDAGE ACE 4X5 VEL STRL LF (GAUZE/BANDAGES/DRESSINGS) ×2 IMPLANT
BANDAGE ACE 6X5 VEL STRL LF (GAUZE/BANDAGES/DRESSINGS) ×2 IMPLANT
BANDAGE ESMARK 6X9 LF (GAUZE/BANDAGES/DRESSINGS) ×1 IMPLANT
BENZOIN TINCTURE PRP APPL 2/3 (GAUZE/BANDAGES/DRESSINGS) ×2 IMPLANT
BIT DRILL JUGRKNT W/NDL BIT2.9 (DRILL) ×1 IMPLANT
BLADE AVERAGE 25X9 (BLADE) ×1 IMPLANT
BNDG CMPR 9X4 STRL LF SNTH (GAUZE/BANDAGES/DRESSINGS) ×1
BNDG CMPR 9X6 STRL LF SNTH (GAUZE/BANDAGES/DRESSINGS) ×1
BNDG COHESIVE 4X5 TAN STRL (GAUZE/BANDAGES/DRESSINGS) ×2 IMPLANT
BNDG ESMARK 4X9 LF (GAUZE/BANDAGES/DRESSINGS) ×2 IMPLANT
BNDG ESMARK 6X9 LF (GAUZE/BANDAGES/DRESSINGS) ×2
BNDG GAUZE ELAST 4 BULKY (GAUZE/BANDAGES/DRESSINGS) ×2 IMPLANT
COVER SURGICAL LIGHT HANDLE (MISCELLANEOUS) ×1 IMPLANT
CUFF TOURNIQUET SINGLE 24IN (TOURNIQUET CUFF) ×2 IMPLANT
CUFF TOURNIQUET SINGLE 34IN LL (TOURNIQUET CUFF) IMPLANT
CUFF TOURNIQUET SINGLE 44IN (TOURNIQUET CUFF) IMPLANT
DRAPE OEC MINIVIEW 54X84 (DRAPES) ×1 IMPLANT
DRAPE U-SHAPE 47X51 STRL (DRAPES) ×2 IMPLANT
DRILL JUGGERKNOT W/NDL BIT 2.9 (DRILL) ×2
DRSG EMULSION OIL 3X3 NADH (GAUZE/BANDAGES/DRESSINGS) ×2 IMPLANT
DURAPREP 26ML APPLICATOR (WOUND CARE) ×2 IMPLANT
ELECT REM PT RETURN 9FT ADLT (ELECTROSURGICAL) ×2
ELECTRODE REM PT RTRN 9FT ADLT (ELECTROSURGICAL) ×1 IMPLANT
GAUZE SPONGE 4X4 12PLY STRL (GAUZE/BANDAGES/DRESSINGS) ×2 IMPLANT
GLOVE BIO SURGEON STRL SZ8 (GLOVE) ×8 IMPLANT
GLOVE BIOGEL PI IND STRL 8 (GLOVE) ×1 IMPLANT
GLOVE BIOGEL PI INDICATOR 8 (GLOVE) ×1
GOWN STRL REUS W/ TWL LRG LVL3 (GOWN DISPOSABLE) ×1 IMPLANT
GOWN STRL REUS W/ TWL XL LVL3 (GOWN DISPOSABLE) ×3 IMPLANT
GOWN STRL REUS W/TWL 2XL LVL3 (GOWN DISPOSABLE) ×2 IMPLANT
GOWN STRL REUS W/TWL LRG LVL3 (GOWN DISPOSABLE) ×2
GOWN STRL REUS W/TWL XL LVL3 (GOWN DISPOSABLE) ×6
KIT BASIN OR (CUSTOM PROCEDURE TRAY) ×2 IMPLANT
KIT TURNOVER KIT B (KITS) ×2 IMPLANT
NDL 1/2 CIR CATGUT .05X1.09 (NEEDLE) IMPLANT
NEEDLE 1/2 CIR CATGUT .05X1.09 (NEEDLE) IMPLANT
NEEDLE 22X1 1/2 (OR ONLY) (NEEDLE) ×2 IMPLANT
NS IRRIG 1000ML POUR BTL (IV SOLUTION) ×2 IMPLANT
PACK ORTHO EXTREMITY (CUSTOM PROCEDURE TRAY) ×2 IMPLANT
PAD ABD 8X10 STRL (GAUZE/BANDAGES/DRESSINGS) ×1 IMPLANT
PAD ARMBOARD 7.5X6 YLW CONV (MISCELLANEOUS) ×4 IMPLANT
PAD CAST 4YDX4 CTTN HI CHSV (CAST SUPPLIES) ×1 IMPLANT
PADDING CAST COTTON 4X4 STRL (CAST SUPPLIES) ×2
PADDING CAST COTTON 6X4 STRL (CAST SUPPLIES) ×2 IMPLANT
STAPLER VISISTAT 35W (STAPLE) ×2 IMPLANT
STRIP CLOSURE SKIN 1/2X4 (GAUZE/BANDAGES/DRESSINGS) ×2 IMPLANT
SUT ETHIBOND 2 OS 4 DA (SUTURE) IMPLANT
SUT FIBERWIRE #2 38 T-5 BLUE (SUTURE)
SUT PROLENE 3 0 PS 2 (SUTURE) IMPLANT
SUT PROLENE 4 0 PS 2 18 (SUTURE) IMPLANT
SUT VIC AB 0 CT1 27 (SUTURE)
SUT VIC AB 0 CT1 27XBRD ANBCTR (SUTURE) IMPLANT
SUT VIC AB 2-0 SH 27 (SUTURE) ×2
SUT VIC AB 2-0 SH 27XBRD (SUTURE) ×1 IMPLANT
SUT VIC AB 3-0 FS2 27 (SUTURE) ×1 IMPLANT
SUTURE FIBERWR #2 38 T-5 BLUE (SUTURE) IMPLANT
SYR 20CC LL (SYRINGE) ×2 IMPLANT
TUBE CONNECTING 20X1/4 (TUBING) ×1 IMPLANT
UNDERPAD 30X30 (UNDERPADS AND DIAPERS) ×2 IMPLANT
WATER STERILE IRR 1000ML POUR (IV SOLUTION) ×2 IMPLANT
YANKAUER SUCT BULB TIP NO VENT (SUCTIONS) ×1 IMPLANT

## 2018-06-12 NOTE — Anesthesia Preprocedure Evaluation (Addendum)
Anesthesia Evaluation  Patient identified by MRN, date of birth, ID band Patient awake    Reviewed: Allergy & Precautions, NPO status , Patient's Chart, lab work & pertinent test results  History of Anesthesia Complications Negative for: history of anesthetic complications  Airway Mallampati: I  TM Distance: >3 FB Neck ROM: Full    Dental  (+) Dental Advisory Given   Pulmonary sleep apnea (sometimes uses CPAP) ,    breath sounds clear to auscultation       Cardiovascular (-) hypertension Rhythm:Regular Rate:Normal  '15 stress test normal   Neuro/Psych negative neurological ROS     GI/Hepatic Neg liver ROS, GERD  Medicated and Controlled,  Endo/Other  Morbid obesity  Renal/GU negative Renal ROS     Musculoskeletal   Abdominal (+) + obese,   Peds  Hematology negative hematology ROS (+)   Anesthesia Other Findings   Reproductive/Obstetrics                            Anesthesia Physical Anesthesia Plan  ASA: III  Anesthesia Plan: General   Post-op Pain Management: GA combined w/ Regional for post-op pain   Induction: Intravenous  PONV Risk Score and Plan: 2 and Ondansetron and Dexamethasone  Airway Management Planned: Oral ETT  Additional Equipment:   Intra-op Plan:   Post-operative Plan: Extubation in OR  Informed Consent: I have reviewed the patients History and Physical, chart, labs and discussed the procedure including the risks, benefits and alternatives for the proposed anesthesia with the patient or authorized representative who has indicated his/her understanding and acceptance.   Dental advisory given  Plan Discussed with: CRNA and Surgeon  Anesthesia Plan Comments: (Plan routine monitors, GETA with popliteal block for post op analgesia)       Anesthesia Quick Evaluation

## 2018-06-12 NOTE — Anesthesia Procedure Notes (Addendum)
Procedure Name: Intubation Date/Time: 06/12/2018 1:14 PM Performed by: Julieta Bellini, CRNA Pre-anesthesia Checklist: Patient identified, Emergency Drugs available, Suction available, Patient being monitored and Timeout performed Patient Re-evaluated:Patient Re-evaluated prior to induction Oxygen Delivery Method: Circle system utilized Preoxygenation: Pre-oxygenation with 100% oxygen Induction Type: IV induction Ventilation: Two handed mask ventilation required Laryngoscope Size: Mac and 4 Grade View: Grade I Tube type: Oral Tube size: 7.5 mm Number of attempts: 1 Placement Confirmation: ETT inserted through vocal cords under direct vision,  positive ETCO2 and breath sounds checked- equal and bilateral Secured at: 23 cm Tube secured with: Tape Dental Injury: Teeth and Oropharynx as per pre-operative assessment

## 2018-06-12 NOTE — Op Note (Signed)
Reese Stockman [id number] 06/12/2018   PRE-OP DIAGNOSIS: right TAR and spurs  POST-OP DIAGNOSIS: same  PROCEDURE: right TAR and spur excisin  ANESTHESIA: general and block  Justyna Timoney G   Dictation #:  [id number]

## 2018-06-12 NOTE — Op Note (Signed)
[name], [name] MEDICAL RECORD [id number] ACCOUNT [id number] DATE OF BIRTH:[birth year] FACILITY: [facility] LOCATION: [place], [place]  OPERATIVE REPORT  DATE OF PROCEDURE:  06/12/2018  PREOPERATIVE DIAGNOSES:    1. Right Achilles partial tear. 2. Right Achilles insertional spurs.   3.  Right heel pump bump.  POSTOPERATIVE DIAGNOSES:    1. Right Achilles partial tear. 2. Right Achilles insertional spurs.   3.  Right heel pump bump.  PROCEDURES: 1.  Right heel spur and pump bump excisions. 2.  Right Achilles tendon repair.  ANESTHESIA:  General and popliteal block.  SURGEON:   Christena Flake, MD  ASSISTANT:  Loni Dolly PA.  INDICATIONS:  The patient is a 46 year old male with long history of bilateral heel pain.  This has persisted despite various walking appliances and topicals and oral anti-inflammatories and therapy.  He is status post a successful procedure on the left  heel for removal of this insertional spurs and repair of the Achilles.  He was offered the same thing on the right at this point.  Informed operative consent was obtained after discussion of possible complications including reaction to anesthesia,  infection, wound healing problems, and Achilles rupture.  SUMMARY OF FINDINGS AND PROCEDURE:  Under general anesthesia and a popliteal block a right heel procedure was performed.  He had a large insertional spur which we excised under fluoroscopic guidance, required attachment of about half the Achilles.  He  also had a very large pump bump which we removed through the central split in the Achilles.  We then repaired the Achilles with 4 anchors from the Biomet set.  He used fluoroscopy throughout the case to make appropriate intraoperative decisions and read  all these views myself.  He was closed primarily and scheduled to potentially go home same day depending on his status in recovery room postop.  DESCRIPTION OF PROCEDURE:  The  patient was taken to the operating suite where general anesthetic was applied without difficulty.  He was also given a popliteal block in the preanesthesia area.  He was positioned prone with chest rolls and all bony  prominences were appropriately padded.  He was then prepped and draped in a normal sterile fashion.  After the administration of preoperative IV Kefzol and appropriate timeout, the right leg was elevated, exsanguinated, and tourniquet inflated about the  calf.  I made a posterolateral incision with dissection down to the Achilles.  This structure was then split longitudinally at the insertion and a large insertional spur was exposed.  I then removed this with an oscillating saw and rongeur.  Removal was  confirmed to be complete by fluoroscopic examination.  I then used the saw to remove a prominent pump bump through the central split in the Achilles.  This too was seen to be removed appropriately under fluoroscopic exam.  We then found our proper area  of Achilles repair.  I placed 2 medial row JuggerKnot suture anchors and passed 3 of the 4 sutures up through the Achilles.  This was tied simply over the base of the Achilles at the attachment site.  I took the remaining JuggerKnot anchor and took it up  through the Achilles side-to-side for a Buyer, retail.  I then took the aforementioned 3 sutures, which were tied down to the base of the attachment and crossed these using 2 Quattro anchors, performing a suture bridge repair of the Achilles.   We took one limb from each of the sets of sutures and used 2  Quattro anchors to perform the suture bridge repair.  This gave [place] a nice tight flat repair.  The wound was irrigated followed by reapproximation of deep tissues with Vicryl.  The tourniquet  was deflated and a small amount of bleeding was easily controlled with some pressure.  We injected some Marcaine with epinephrine about the incision site followed by subcuticular closure with a  running stitch.  Adaptic was applied followed by dry gauze  and a posterior splint of plaster with the ankle in slight plantar flexion.    Estimated blood loss and fluid obtained from anesthesia records as can accurate tourniquet time.  DISPOSITION:  The patient was extubated in the operating room and taken to recovery room in stable condition.  Plans were for him to potentially go home the same day versus overnight stay depending on his status and recovery.  AN/NUANCE  D:06/12/2018 T:06/12/2018 JOB:[id number]

## 2018-06-12 NOTE — Transfer of Care (Signed)
Immediate Anesthesia Transfer of Care Note  Patient: [name]  Procedure(s) Performed: RIGHT ACHILLES TENDON REPAIR AND EXCISION OF HEEL SPUR (Right Ankle)  Patient Location: PACU  Anesthesia Type:General and GA combined with regional for post-op pain  Level of Consciousness: awake and drowsy  Airway & Oxygen Therapy: Patient Spontanous Breathing and Patient connected to nasal cannula oxygen  Post-op Assessment: Report given to RN and Post -op Vital signs reviewed and stable  Post vital signs: Reviewed and stable  Last Vitals:  Vitals Value Taken Time  BP 137/90 06/12/2018  2:44 PM  Temp    Pulse 71 06/12/2018  2:48 PM  Resp 8 06/12/2018  2:48 PM  SpO2 97 % 06/12/2018  2:48 PM  Vitals shown include unvalidated device data.  Last Pain:  Vitals:   06/12/18 1107  PainSc: 2          Complications: No apparent anesthesia complications

## 2018-06-12 NOTE — Anesthesia Procedure Notes (Signed)
Anesthesia Regional Block: Popliteal block   Pre-Anesthetic Checklist: ,, timeout performed, Correct Patient, Correct Site, Correct Laterality, Correct Procedure, Correct Position, site marked, Risks and benefits discussed,  Surgical consent,  Pre-op evaluation,  At surgeon's request and post-op pain management  Laterality: Right and Lower  Prep: chloraprep       Needles:  Injection technique: Single-shot  Needle Type: Echogenic Stimulator Needle     Needle Length: 9cm  Needle Gauge: 21     Additional Needles:   Procedures:, nerve stimulator,,, ultrasound used (permanent image in chart),,,,   Nerve Stimulator or Paresthesia:  Response: toe twitch, 0.4 mA, 0.1 ms,   Additional Responses:   Narrative:  Start time: 06/12/2018 12:21 PM End time: 06/12/2018 12:28 PM Injection made incrementally with aspirations every 5 mL.  Performed by: Personally  Anesthesiologist: Annye Asa, MD  Additional Notes: Pt identified in Holding room.  Monitors applied. Working IV access confirmed. Sterile prep R lateral knee.  #21ga PNS to toe twitches at 0.79mA threshold with US guidance.  30cc 0.5% Bupivacaine with 1:200k epi injected incrementally after negative test dose.  Patient asymptomatic, VSS, no heme aspirated, tolerated well.  Jenita Seashore, MD

## 2018-06-12 NOTE — Interval H&P Note (Signed)
History and Physical Interval Note:  06/12/2018 11:13 AM  [name]  has presented today for surgery, with the diagnosis of RIGHT ACHILLIES SPUR AND PUMP BUMP  The various methods of treatment have been discussed with the patient and family. After consideration of risks, benefits and other options for treatment, the patient has consented to  Procedure(s): Vanderbilt (Right) as a surgical intervention .  The patient's history has been reviewed, patient examined, no change in status, stable for surgery.  I have reviewed the patient's chart and labs.  Questions were answered to the patient's satisfaction.     Gerda Yin G

## 2018-06-12 NOTE — Anesthesia Postprocedure Evaluation (Signed)
Anesthesia Post Note  Patient: [name]  Procedure(s) Performed: RIGHT ACHILLES TENDON REPAIR AND EXCISION OF HEEL SPUR (Right Ankle)     Patient location during evaluation: PACU Anesthesia Type: General and Regional Level of consciousness: awake and alert, oriented and patient cooperative Pain management: pain level controlled Vital Signs Assessment: post-procedure vital signs reviewed and stable Respiratory status: spontaneous breathing, nonlabored ventilation and respiratory function stable Cardiovascular status: blood pressure returned to baseline and stable Postop Assessment: no apparent nausea or vomiting Anesthetic complications: no    Last Vitals:  Vitals:   06/12/18 1500 06/12/18 1540  BP: 133/81 (!) 139/91  Pulse: 72 65  Resp: 17 18  Temp:  (!) 36.3 C  SpO2: 95% 94%    Last Pain:  Vitals:   06/12/18 1540  PainSc: 0-No pain                 Vennessa Affinito,E. Yoon Barca

## 2018-06-13 ENCOUNTER — Encounter (HOSPITAL_COMMUNITY): Payer: Self-pay | Admitting: Orthopaedic Surgery

## 2018-06-20 ENCOUNTER — Ambulatory Visit (INDEPENDENT_AMBULATORY_CARE_PROVIDER_SITE_OTHER): Payer: BLUE CROSS/BLUE SHIELD | Admitting: Urology

## 2018-06-20 ENCOUNTER — Encounter: Payer: Self-pay | Admitting: Urology

## 2018-06-20 VITALS — BP 118/72 | HR 106 | Ht 76.0 in | Wt 322.0 lb

## 2018-06-20 DIAGNOSIS — Z3009 Encounter for other general counseling and advice on contraception: Secondary | ICD-10-CM

## 2018-06-20 DIAGNOSIS — N471 Phimosis: Secondary | ICD-10-CM

## 2018-06-21 ENCOUNTER — Other Ambulatory Visit: Payer: Self-pay | Admitting: Radiology

## 2018-06-21 DIAGNOSIS — N471 Phimosis: Secondary | ICD-10-CM

## 2018-06-21 DIAGNOSIS — Z302 Encounter for sterilization: Secondary | ICD-10-CM

## 2018-06-24 ENCOUNTER — Encounter: Payer: Self-pay | Admitting: Urology

## 2018-06-24 DIAGNOSIS — N471 Phimosis: Secondary | ICD-10-CM | POA: Insufficient documentation

## 2018-06-24 NOTE — H&P (View-Only) (Signed)
06/20/2018 9:51 AM   [name] 01-09-1971 [id number]  Referring provider: Venia Carbon, MD [address] [place], [place] [address]  Chief Complaint  Patient presents with  . Phimosis    HPI: 47 year old male who presents with a several month history of progressive tightening of his foreskin during erections.  He does note irritation of the inner prepuce with a burning sensation of the foreskin after urinating and after ejaculation.  He is able to retract his foreskin but does note fissuring.  There is no previous history of diabetes or prior urologic problems.  He is married with 8 children and also desires vasectomy as a means of permanent sterilization.  He otherwise has no bothersome lower urinary tract symptoms.  Denies flank, abdominal, pelvic or scrotal pain.   PMH: Past Medical History:  Diagnosis Date  . Carpal tunnel syndrome    bileteral - "Minor"  . Complication of anesthesia 1996   ran a fever after sinus surgery.  In [place].  . Family history of adverse reaction to anesthesia   . Heart murmur   . Hyperlipidemia   . Mitral valve prolapse   . Personal history of colonic polyps = adenoma 01/24/2012   5 mm adenoma removed 01/2012 Carlean Purl   . Rectal bleeding    polyp removed 01/19/2012 pre canerous  . Sleep apnea     Surgical History: Past Surgical History:  Procedure Laterality Date  . ACHILLES TENDON SURGERY Right 06/12/2018   Procedure: RIGHT ACHILLES TENDON REPAIR AND EXCISION OF HEEL SPUR;  Surgeon: Melrose Nakayama, MD;  Location: [place];  Service: Orthopedics;  Laterality: Right;  . CHONDROPLASTY  02/27/2012   Procedure: CHONDROPLASTY;  Surgeon: Alta Corning, MD;  Location: [facility];  Service: Orthopedics;  Laterality: Right;  patella-femoral joint  . COLONOSCOPY    . HEEL SPUR SURGERY Left 2016  . KNEE ARTHROSCOPY Left 3/16  . KNEE ARTHROSCOPY Left 03/10/2017   Procedure: ARTHROSCOPY KNEE;  Surgeon: Dorna Leitz, MD;  Location: [facility];   Service: Orthopedics;  Laterality: Left;  . LASIK  2006   bilateral  . NASAL TURBINATE REDUCTION  1996    Home Medications:  Allergies as of 06/20/2018   No Known Allergies     Medication List        Accurate as of 06/20/18 11:59 PM. Always use your most recent med list.          gabapentin 300 MG capsule Commonly known as:  NEURONTIN   HYDROcodone-acetaminophen 5-325 MG tablet Commonly known as:  NORCO Take 1-2 tablets by mouth every 6 (six) hours as needed for moderate pain.   meloxicam 15 MG tablet Commonly known as:  MOBIC Take 15 mg by mouth daily.   Vitamin D (Ergocalciferol) 50000 units Caps capsule Commonly known as:  DRISDOL Take 50,000 Units by mouth every 7 (seven) days.       Allergies: No Known Allergies  Family History: Family History  Problem Relation Age of Onset  . Multiple sclerosis Mother   . Anesthesia problems Mother   . Hypertension Mother   . Colitis Father        questionable  . Asthma Unknown        children; seasonal  . Arthritis Son        juvinile rheumatoid  . Colon cancer Neg Hx     Social History:  reports that he has never smoked. He has never used smokeless tobacco. He reports that he drinks alcohol.  He reports that he does not use drugs.  ROS: UROLOGY Frequent Urination?: No Hard to postpone urination?: No Burning/pain with urination?: Yes Get up at night to urinate?: No Leakage of urine?: No Urine stream starts and stops?: No Trouble starting stream?: No Do you have to strain to urinate?: No Blood in urine?: No Urinary tract infection?: No Sexually transmitted disease?: No Injury to kidneys or bladder?: No Painful intercourse?: Yes Weak stream?: No Erection problems?: Yes Penile pain?: Yes  Gastrointestinal Nausea?: No Vomiting?: No Indigestion/heartburn?: No Diarrhea?: No Constipation?: No  Constitutional Fever: No Night sweats?: No Weight loss?: No Fatigue?: No  Skin Skin rash/lesions?:  Yes Itching?: Yes  Eyes Blurred vision?: No Double vision?: No  Ears/Nose/Throat Sore throat?: No Sinus problems?: No  Hematologic/Lymphatic Swollen glands?: No Easy bruising?: No  Cardiovascular Leg swelling?: No Chest pain?: No  Respiratory Cough?: No Shortness of breath?: No  Endocrine Excessive thirst?: No  Musculoskeletal Back pain?: No Joint pain?: Yes  Neurological Headaches?: No Dizziness?: No  Psychologic Depression?: No Anxiety?: No  Physical Exam: BP 118/72 (BP Location: Left Arm, Patient Position: Sitting, Cuff Size: Large)   Pulse (!) 106   Ht 6\' 4"  (1.93 m)   Wt (!) 322 lb (146.1 kg)   BMI 39.20 kg/m   Constitutional:  Alert and oriented, No acute distress. HEENT: [place] AT, moist mucus membranes.  Trachea midline, no masses. Cardiovascular: No clubbing, cyanosis, or edema.  RRR Respiratory: Normal respiratory effort, no increased work of breathing.  Lungs clear GI: Abdomen is soft, nontender, nondistended, no abdominal masses GU: No CVA tenderness.  The prepuce is tight but retractable.  Circumferential fissures of the inner prepuce. Lymph: No cervical or inguinal lymphadenopathy. Skin: No rashes, bruises or suspicious lesions. Neurologic: Grossly intact, no focal deficits, moving all 4 extremities. Psychiatric: Normal mood and affect.   Assessment & Plan:   47 year old male with phimosis.  Treatment options were discussed including circumcision and dorsal slit.  The pros and cons of each procedure were discussed.  Continued observation was also discussed as a management option.  He would like to schedule circumcision.  The procedure was discussed in detail including potential risks of bleeding, infection and scarring.  He also desires vasectomy.  We had a long discussion about vasectomy. We specifically discussed the procedure, recovery and the risks, benefits and alternatives of vasectomy. I explained that the procedure entails removal of a  segment of each vas deferens, each of which conducts sperm, and that the purpose of this procedure is to cause sterility (inability to produce children or cause pregnancy). Vasectomy is intended to be permanent and irreversible form of contraception. Options for fertility after vasectomy include vasectomy reversal, or sperm retrieval with in vitro fertilization. These options are not always successful, and they may be expensive. We discussed reversible forms of birth control such as condoms, IUD or diaphragms, as well as the option of freezing sperm in a sperm bank prior to the vasectomy procedure. We discussed the importance of avoiding strenuous exercise for four days after vasectomy, and the importance of refraining from any form of ejaculation for seven days after vasectomy. I explained that vasectomy does not produce immediate sterility so another form of contraceptive must be used until sterility is assured by having semen checked for sperm. Thus, a post vasectomy semen analysis is necessary to confirm sterility. Rarely, vasectomy must be repeated. We discussed the approximately 1 in 2,000 risk of pregnancy after vasectomy for men who have post-vasectomy semen analysis  showing absent sperm or rare non-motile sperm. Typical side effects include a small amount of oozing blood, some discomfort and mild swelling in the area of incision.  Vasectomy does not affect sexual performance, function, please, sensation, interest, desire, satisfaction, penile erection, volume of semen or ejaculation. Other rare risks include allergy or adverse reaction to an anesthetic, testicular atrophy, hematoma, infection/abscess, prolonged tenderness of the vas deferens, pain, swelling, painful nodule or scar (called sperm granuloma) or epididymtis. We discussed chronic testicular pain syndrome. This has been reported to occur in as many as 1-2% of men and may be permanent. This can be treated with medication, small procedures or  (rarely) surgery.  He indicated all questions were answered and desires to proceed with the above procedures which will be scheduled in same day surgery under sedation/local.    Abbie Sons, MD  [facility] [address], [place], [place] [address] [phone]

## 2018-06-24 NOTE — Progress Notes (Signed)
06/20/2018 9:51 AM   [name] [birth year] [id number]  Referring provider: Venia Carbon, MD [address] [place], [place] [address]  Chief Complaint  Patient presents with  . Phimosis    HPI: 47 year old male who presents with a several month history of progressive tightening of his foreskin during erections.  He does note irritation of the inner prepuce with a burning sensation of the foreskin after urinating and after ejaculation.  He is able to retract his foreskin but does note fissuring.  There is no previous history of diabetes or prior urologic problems.  He is married with 8 children and also desires vasectomy as a means of permanent sterilization.  He otherwise has no bothersome lower urinary tract symptoms.  Denies flank, abdominal, pelvic or scrotal pain.   PMH: Past Medical History:  Diagnosis Date  . Carpal tunnel syndrome    bileteral - "Minor"  . Complication of anesthesia 1996   ran a fever after sinus surgery.  In [place].  . Family history of adverse reaction to anesthesia   . Heart murmur   . Hyperlipidemia   . Mitral valve prolapse   . Personal history of colonic polyps = adenoma 01/24/2012   5 mm adenoma removed 01/2012 [name]   . Rectal bleeding    polyp removed 01/19/2012 pre canerous  . Sleep apnea     Surgical History: Past Surgical History:  Procedure Laterality Date  . ACHILLES TENDON SURGERY Right 06/12/2018   Procedure: RIGHT ACHILLES TENDON REPAIR AND EXCISION OF HEEL SPUR;  Surgeon: Melvin Nakayama, MD;  Location: [facility];  Service: Orthopedics;  Laterality: Right;  . CHONDROPLASTY  02/27/2012   Procedure: CHONDROPLASTY;  Surgeon: Melvin Corning, MD;  Location: [facility];  Service: Orthopedics;  Laterality: Right;  patella-femoral joint  . COLONOSCOPY    . HEEL SPUR SURGERY Left 2016  . KNEE ARTHROSCOPY Left 3/16  . KNEE ARTHROSCOPY Left 03/10/2017   Procedure: ARTHROSCOPY KNEE;  Surgeon: Melvin Leitz, MD;  Location: [facility];   Service: Orthopedics;  Laterality: Left;  . LASIK  2006   bilateral  . NASAL TURBINATE REDUCTION  1996    Home Medications:  Allergies as of 06/20/2018   No Known Allergies     Medication List        Accurate as of 06/20/18 11:59 PM. Always use your most recent med list.          gabapentin 300 MG capsule Commonly known as:  NEURONTIN   HYDROcodone-acetaminophen 5-325 MG tablet Commonly known as:  NORCO Take 1-2 tablets by mouth every 6 (six) hours as needed for moderate pain.   meloxicam 15 MG tablet Commonly known as:  MOBIC Take 15 mg by mouth daily.   Vitamin D (Ergocalciferol) 50000 units Caps capsule Commonly known as:  DRISDOL Take 50,000 Units by mouth every 7 (seven) days.       Allergies: No Known Allergies  Family History: Family History  Problem Relation Age of Onset  . Multiple sclerosis Mother   . Anesthesia problems Mother   . Hypertension Mother   . Colitis Father        questionable  . Asthma Unknown        children; seasonal  . Arthritis Son        juvinile rheumatoid  . Colon cancer Neg Hx     Social History:  reports that he has never smoked. He has never used smokeless tobacco. He reports that he drinks alcohol.  He reports that he does not use drugs.  ROS: UROLOGY Frequent Urination?: No Hard to postpone urination?: No Burning/pain with urination?: Yes Get up at night to urinate?: No Leakage of urine?: No Urine stream starts and stops?: No Trouble starting stream?: No Do you have to strain to urinate?: No Blood in urine?: No Urinary tract infection?: No Sexually transmitted disease?: No Injury to kidneys or bladder?: No Painful intercourse?: Yes Weak stream?: No Erection problems?: Yes Penile pain?: Yes  Gastrointestinal Nausea?: No Vomiting?: No Indigestion/heartburn?: No Diarrhea?: No Constipation?: No  Constitutional Fever: No Night sweats?: No Weight loss?: No Fatigue?: No  Skin Skin rash/lesions?:  Yes Itching?: Yes  Eyes Blurred vision?: No Double vision?: No  Ears/Nose/Throat Sore throat?: No Sinus problems?: No  Hematologic/Lymphatic Swollen glands?: No Easy bruising?: No  Cardiovascular Leg swelling?: No Chest pain?: No  Respiratory Cough?: No Shortness of breath?: No  Endocrine Excessive thirst?: No  Musculoskeletal Back pain?: No Joint pain?: Yes  Neurological Headaches?: No Dizziness?: No  Psychologic Depression?: No Anxiety?: No  Physical Exam: BP 118/72 (BP Location: Left Arm, Patient Position: Sitting, Cuff Size: Large)   Pulse (!) 106   Ht 6\' 4"  (1.93 m)   Wt (!) 322 lb (146.1 kg)   BMI 39.20 kg/m   Constitutional:  Alert and oriented, No acute distress. HEENT: [name], moist mucus membranes.  Trachea midline, no masses. Cardiovascular: No clubbing, cyanosis, or edema.  RRR Respiratory: Normal respiratory effort, no increased work of breathing.  Lungs clear GI: Abdomen is soft, nontender, nondistended, no abdominal masses GU: No CVA tenderness.  The prepuce is tight but retractable.  Circumferential fissures of the inner prepuce. Lymph: No cervical or inguinal lymphadenopathy. Skin: No rashes, bruises or suspicious lesions. Neurologic: Grossly intact, no focal deficits, moving all 4 extremities. Psychiatric: Normal mood and affect.   Assessment & Plan:   47 year old male with phimosis.  Treatment options were discussed including circumcision and dorsal slit.  The pros and cons of each procedure were discussed.  Continued observation was also discussed as a management option.  He would like to schedule circumcision.  The procedure was discussed in detail including potential risks of bleeding, infection and scarring.  He also desires vasectomy.  We had a long discussion about vasectomy. We specifically discussed the procedure, recovery and the risks, benefits and alternatives of vasectomy. I explained that the procedure entails removal of a  segment of each vas deferens, each of which conducts sperm, and that the purpose of this procedure is to cause sterility (inability to produce children or cause pregnancy). Vasectomy is intended to be permanent and irreversible form of contraception. Options for fertility after vasectomy include vasectomy reversal, or sperm retrieval with in vitro fertilization. These options are not always successful, and they may be expensive. We discussed reversible forms of birth control such as condoms, IUD or diaphragms, as well as the option of freezing sperm in a sperm bank prior to the vasectomy procedure. We discussed the importance of avoiding strenuous exercise for four days after vasectomy, and the importance of refraining from any form of ejaculation for seven days after vasectomy. I explained that vasectomy does not produce immediate sterility so another form of contraceptive must be used until sterility is assured by having semen checked for sperm. Thus, a post vasectomy semen analysis is necessary to confirm sterility. Rarely, vasectomy must be repeated. We discussed the approximately 1 in 2,000 risk of pregnancy after vasectomy for men who have post-vasectomy semen analysis  showing absent sperm or rare non-motile sperm. Typical side effects include a small amount of oozing blood, some discomfort and mild swelling in the area of incision.  Vasectomy does not affect sexual performance, function, please, sensation, interest, desire, satisfaction, penile erection, volume of semen or ejaculation. Other rare risks include allergy or adverse reaction to an anesthetic, testicular atrophy, hematoma, infection/abscess, prolonged tenderness of the vas deferens, pain, swelling, painful nodule or scar (called sperm granuloma) or epididymtis. We discussed chronic testicular pain syndrome. This has been reported to occur in as many as 1-2% of men and may be permanent. This can be treated with medication, small procedures or  (rarely) surgery.  He indicated all questions were answered and desires to proceed with the above procedures which will be scheduled in same day surgery under sedation/local.    Abbie Sons, MD  [facility] [address], [place], [place] [address] [phone]

## 2018-07-04 ENCOUNTER — Encounter
Admission: RE | Admit: 2018-07-04 | Discharge: 2018-07-04 | Disposition: A | Discharge status: Home / Self Care | Payer: BLUE CROSS/BLUE SHIELD | Source: Ambulatory Visit | Attending: Urology | Admitting: Urology

## 2018-07-04 ENCOUNTER — Other Ambulatory Visit: Payer: Self-pay

## 2018-07-04 ENCOUNTER — Other Ambulatory Visit: Payer: BLUE CROSS/BLUE SHIELD

## 2018-07-04 HISTORY — DX: Unspecified right bundle-branch block: I45.10

## 2018-07-04 NOTE — Pre-Procedure Instructions (Signed)
Carvel Getting, NP  Nurse Practitioner  Anesthesiology  Progress Notes  Signed  Date of Service:  06/11/2018 10:03 AM          Signed            Show:Clear all [x] Manual[x] Template[x] Copied  Added by: [x] Carvel Getting, NP   [] Hover for details   Anesthesia Chart Review:  Pt is a same day work up   Case:  [id number] Date/Time:  06/12/18 1225   Procedure:  RIGHT ACHILLES TENDON REPAIR AND EXCISION OF HEEL SPUR (Right )   Anesthesia type:  General   Pre-op diagnosis:  RIGHT ACHILLIES SPUR AND PUMP BUMP   Location:  [facility] OR ROOM 08 / [facility]   Surgeon:  Melrose Nakayama, MD      DISCUSSION: - Pt is a 47 year old male with hx MVP, OSA  - Anesthesia history: Pt reports having an increased temperature after sinus surgery in 1996 in [place]. Pt also reports his mother had an increased temp and chills >30 years ago after c-section. Anesthesia record from 03/10/17 in Epic documents pt received sevoflurane.    PROVIDERS: PCP is Venia Carbon, MD  Saw cardiologist Ida Rogue, MD 10/10/16 for eval chest pain.  Stress test ordered, normal results below.  F/u prn recommended.    LABS: Will be obtained day of surgery   EKG: Will be obtained day of surgery   CV:  Nuclear stress test 09/10/14: 1. Exercise myocardial perfusion study with no significant ischemia. 2. No significant wall motion abnormality noted.       Past Medical History:  Diagnosis Date  . Carpal tunnel syndrome    bileteral - "Minor"  . Complication of anesthesia 1996   ran a fever after sinus surgery.  In [place].  . Family history of adverse reaction to anesthesia   . Heart murmur   . Hyperlipidemia   . Mitral valve prolapse   . Personal history of colonic polyps = adenoma 01/24/2012   5 mm adenoma removed 01/2012 Carlean Purl   . Rectal bleeding    polyp removed 01/19/2012 pre canerous  . Sleep apnea          Past Surgical History:  Procedure Laterality  Date  . CHONDROPLASTY  02/27/2012   Procedure: CHONDROPLASTY;  Surgeon: Alta Corning, MD;  Location: [facility];  Service: Orthopedics;  Laterality: Right;  patella-femoral joint  . COLONOSCOPY    . HEEL SPUR SURGERY Left 2016  . KNEE ARTHROSCOPY Left 3/16  . KNEE ARTHROSCOPY Left 03/10/2017   Procedure: ARTHROSCOPY KNEE;  Surgeon: Dorna Leitz, MD;  Location: [facility];  Service: Orthopedics;  Laterality: Left;  . LASIK  2006   bilateral  . NASAL TURBINATE REDUCTION  1996    MEDICATIONS: No current facility-administered medications for this encounter.    Marland Kitchen ibuprofen (ADVIL,MOTRIN) 200 MG tablet  . Vitamin D, Ergocalciferol, (DRISDOL) 50000 units CAPS capsule  . predniSONE (DELTASONE) 20 MG tablet  . ranitidine (ZANTAC) 150 MG tablet    If labs and EKG acceptable day of surgery, I anticipate pt can proceed with surgery as scheduled.   Willeen Cass, FNP-BC [facility] Short Stay Surgical Center/Anesthesiology Phone: [phone] 06/11/2018 10:54 AM               Electronically signed by Carvel Getting, NP at 06/11/2018 10:58 AM     Admission (Discharged) on 06/12/2018        Detailed Report

## 2018-07-04 NOTE — Pre-Procedure Instructions (Signed)
Ordering physician: Wellington Hampshire, MD Study date: 09/10/2014  Patient Information   Name MRN Description  [name] "[name]" [id number] 47 y.o. Male  Result Notes for Myocardial Perfusion Imaging   Notes Recorded by Tracie Harrier, RN on 09/30/2014 at 8:53 AM Reviewed results with patient.   ------  Notes Recorded by Tracie Harrier, RN on 09/10/2014 at 4:30 PM LVM 9/30 ------  Notes Recorded by Wellington Hampshire, MD on 09/10/2014 at 12:12 PM Inform patient that stress test was normal. ------  Notes Recorded by Tracie Harrier, RN on 09/10/2014 at 10:57 AM RN reviewed. Forwarded to MD.

## 2018-07-04 NOTE — Patient Instructions (Signed)
Your procedure is scheduled on: 07-10-18  Report to Same Day Surgery 2nd floor medical mall [facility] Entrance-take elevator on left to 2nd floor.  Check in with surgery information desk.) To find out your arrival time please call [phone] between 1PM - 3PM on 07-09-18  Remember: Instructions that are not followed completely may result in serious medical risk, up to and including death, or upon the discretion of your surgeon and anesthesiologist your surgery may need to be rescheduled.    _x___ 1. Do not eat food after midnight the night before your procedure. NO GUM OR CANDY AFTER MIDNIGHT.  You may drink clear liquids up to 2 hours before you are scheduled to arrive at the hospital for your procedure.  Do not drink clear liquids within 2 hours of your scheduled arrival to the hospital.  Clear liquids include  --Water or Apple juice without pulp  --Clear carbohydrate beverage such as ClearFast or Gatorade  --Black Coffee or Clear Tea (No milk, no creamers, do not add anything to the coffee or Tea    __x__ 2. No Alcohol for 24 hours before or after surgery.   __x__3. No Smoking or e-cigarettes for 24 prior to surgery.  Do not use any chewable tobacco products for at least 6 hour prior to surgery   ____  4. Bring all medications with you on the day of surgery if instructed.    __x__ 5. Notify your doctor if there is any change in your medical condition     (cold, fever, infections).    x___6. On the morning of surgery brush your teeth with toothpaste and water.  You may rinse your mouth with mouth wash if you wish.  Do not swallow any toothpaste or mouthwash.   Do not wear jewelry, make-up, hairpins, clips or nail polish.  Do not wear lotions, powders, or perfumes. You may wear deodorant.  Do not shave 48 hours prior to surgery. Men may shave face and neck.  Do not bring valuables to the hospital.    [facility] is not responsible for any belongings or valuables.    Contacts, dentures or bridgework may not be worn into surgery.  Leave your suitcase in the car. After surgery it may be brought to your room.  For patients admitted to the hospital, discharge time is determined by your treatment team.  _  Patients discharged the day of surgery will not be allowed to drive home.  You will need someone to drive you home and stay with you the night of your procedure.    Please read over the following fact sheets that you were given:   [facility] Preparing for Surgery and or MRSA Information   ____ Take anti-hypertensive listed below, cardiac, seizure, asthma, anti-reflux and psychiatric medicines. These include:  1. NONE  2.  3.  4.  5.  6.  ____Fleets enema or Magnesium Citrate as directed.   ____ Use CHG Soap or sage wipes as directed on instruction sheet   ____ Use inhalers on the day of surgery and bring to hospital day of surgery  ____ Stop Metformin and Janumet 2 days prior to surgery.    ____ Take 1/2 of usual insulin dose the night before surgery and none on the morning surgery.   ____ Follow recommendations from Cardiologist, Pulmonologist or PCP regarding stopping Aspirin, Coumadin, Plavix ,Eliquis, Effient, or Pradaxa, and Pletal.  X____Stop Anti-inflammatories such as Advil, Aleve, Ibuprofen, Motrin, Naproxen, Naprosyn, Goodies powders or aspirin products  NOW-OK to take Tylenol    ____ Stop supplements until after surgery.     ____ Bring C-Pap to the hospital.

## 2018-07-06 ENCOUNTER — Encounter: Payer: Self-pay | Admitting: Urology

## 2018-07-09 MED ORDER — CEFAZOLIN SODIUM-DEXTROSE 2-4 GM/100ML-% IV SOLN
2.0000 g | INTRAVENOUS | Status: AC
  Administered 2018-07-10: 2 g via INTRAVENOUS

## 2018-07-09 NOTE — Pre-Procedure Instructions (Signed)
Melvin Merritts, MD  Physician  Cardiology  Progress Notes  Signed  Encounter Date:  10/10/2016          Signed      Expand All Collapse All       Show:Clear all [x] Manual[x] Template[x] Copied  Added by: [x] Melvin Merritts, MD   [] Hover for details   Cardiology Office Note  Date:  10/10/2016   ID:  [name], DOB Sep 29, 1971, MRN [id number]  PCP:  Melvin Simpler, MD           Chief Complaint  Patient presents with  . Establish Care    chest pain, tingling in arms, pain between shoulder blades, per pt    HPI:  [name] is a Pleasant 47 year old gentleman with morbid obesity, runs a local restaurant, recent severe stress, long work hours, developed chest pain and shortness of breath. He presents by referral from Dr.  Silvio Pate for consultation  of his chest pain symptoms  He denies any significant family history of coronary artery disease Reports that his mother died of MS, father still alive, no coronary disease  Reports that he is been working very hard recently, one of his workers was out, Wife takes care of 8 children, many of them have health conditions. He works long days, very hard in intensity, Few days to rest and recover Recently started taking Monday off to spend time with his children He reports one episode where he got dizzy, tight in chest and SOB Symptoms lasted for several days in a row  Also reports periodic headaches that would come and go Some nocturia, poor sleep hygiene Feeling exhaused. Does not do any regular exercise program Reports that today he feels okay, going to a movie with 2 of his sons He is excited to start water therapy, possibly with his son  Reports that he does lots of stress eating  Previous records reviewed stress test 2 years ago, 08/2014, no ischemia Echo 11/2014 [facility]  cardiac function  EKG shows sinus rhythm and RBBB, rate 79 bpm  He has chronic Achilles problems, had surgery  left ankle Scheduled to have surgery right ankle Weight >300 pounds Non smoker, no diabetes   PMH:   has a past medical history of Complication of anesthesia (1996); Heart murmur; Hyperlipidemia; Mitral valve prolapse; and Rectal bleeding.  PSH:         Past Surgical History:  Procedure Laterality Date  . CHONDROPLASTY  02/27/2012   Procedure: CHONDROPLASTY;  Surgeon: Alta Corning, MD;  Location: [facility];  Service: Orthopedics;  Laterality: Right;  patella-femoral joint  . COLONOSCOPY    . KNEE ARTHROSCOPY Left 3/16  . LASIK  2006   bilateral  . NASAL TURBINATE REDUCTION  1996    Current Outpatient Prescriptions  Medication Sig Dispense Refill  . meloxicam (MOBIC) 15 MG tablet Take 15 mg by mouth daily as needed for pain.     No current facility-administered medications for this visit.      Allergies:   Review of patient's allergies indicates no known allergies.   Social History:  The patient  reports that he has never smoked. He has never used smokeless tobacco. He reports that he drinks alcohol. He reports that he does not use drugs.   Family History:   family history includes Anesthesia problems in his mother; Arthritis in his son; Colitis in his father; Hypertension in his mother; Multiple sclerosis in his mother.    Review of Systems: Review of Systems  Constitutional: Negative.   Respiratory: Positive for shortness of breath.   Cardiovascular: Positive for chest pain.  Gastrointestinal: Negative.   Musculoskeletal: Positive for joint pain.  Neurological: Negative.   Psychiatric/Behavioral: Negative.   All other systems reviewed and are negative.    PHYSICAL EXAM: VS:  BP 120/80   Pulse 87   Ht 6\' 4"  (1.93 m)   Wt (!) 318 lb 12.8 oz (144.6 kg)   SpO2 97%   BMI 38.81 kg/m  , BMI Body mass index is 38.81 kg/m. GEN: Well nourished, well developed, in no acute distress , obese HEENT: normal  Neck: no JVD, carotid bruits, or  masses Cardiac: RRR; no murmurs, rubs, or gallops,no edema  Respiratory:  clear to auscultation bilaterally, normal work of breathing GI: soft, nontender, nondistended, + BS MS: no deformity or atrophy  Skin: warm and dry, no rash Neuro:  Strength and sensation are intact Psych: euthymic mood, full affect    Recent Labs: 08/17/2016: ALT 60; BUN 19; Creatinine, Ser 0.91; Hemoglobin 14.9; Platelets 239.0; Potassium 4.1; Sodium 139    Lipid Panel RecentLabs       Lab Results  Component Value Date   CHOL 199 05/03/2016   HDL 31.90 (L) 05/03/2016   LDLCALC 140 (H) 05/03/2016   TRIG 136.0 05/03/2016           Wt Readings from Last 3 Encounters:  10/10/16 (!) 318 lb 12.8 oz (144.6 kg)  08/17/16 (!) 312 lb (141.5 kg)  05/03/16 (!) 312 lb (141.5 kg)       ASSESSMENT AND PLAN:  Encounter to establish care with new doctor - Plan: EKG 12-Lead  Pure hypercholesterolemia Long discussion concerning his cholesterol He does not want a pill, prefers to do this through weight loss, exercise  Morbid obesity ([place]) We have encouraged continued exercise, careful diet management in an effort to lose weight. Discussed various strategies. He does not have time for weight watchers  Chest tightness Atypical chest discomfort, consistent with stress related symptoms Discussed various strategies for cardiac workup. He is unable to treadmill. Other screening options include CT coronary calcium scoring for risk stratification. He will call us if symptoms recur and this could be ordered. For severe symptoms, pharmacologic Myoview could be ordered. He may have attenuation artifact given his size.  Shortness of breath Shortness of breath seems to associate with his chest tightness in stressful situations.  Currently no clinical signs of CHF, recent normal echo We will hold off on any further testing at this time. Symptoms have improved as stress has abated  Anxiety Very  stressed given his long work hours, taking care of a children some with medical issues   Total encounter time more than 45 minutes  Greater than 50% was spent in counseling and coordination of care with the patient   Disposition:   F/U  as needed      Orders Placed This Encounter  Procedures  . EKG 12-Lead     Signed, Esmond Plants, M.D., Ph.D. 10/10/2016  [place] [phone]            Electronically signed by Melvin Merritts, MD at 10/10/2016 6:18 PM     Office Visit on 10/10/2016        Detailed Report

## 2018-07-10 ENCOUNTER — Encounter: Payer: Self-pay | Admitting: *Deleted

## 2018-07-10 ENCOUNTER — Ambulatory Visit: Payer: BLUE CROSS/BLUE SHIELD

## 2018-07-10 ENCOUNTER — Ambulatory Visit
Admission: RE | Admit: 2018-07-10 | Discharge: 2018-07-10 | Disposition: A | Discharge status: Home / Self Care | Payer: BLUE CROSS/BLUE SHIELD | Source: Ambulatory Visit | Attending: Urology | Admitting: Urology

## 2018-07-10 ENCOUNTER — Encounter
Admission: RE | Disposition: A | Discharge status: Home / Self Care | Payer: Self-pay | Source: Ambulatory Visit | Attending: Urology

## 2018-07-10 ENCOUNTER — Other Ambulatory Visit: Payer: Self-pay

## 2018-07-10 DIAGNOSIS — Z79899 Other long term (current) drug therapy: Secondary | ICD-10-CM | POA: Diagnosis not present

## 2018-07-10 DIAGNOSIS — N471 Phimosis: Secondary | ICD-10-CM

## 2018-07-10 DIAGNOSIS — F419 Anxiety disorder, unspecified: Secondary | ICD-10-CM | POA: Insufficient documentation

## 2018-07-10 DIAGNOSIS — N4839 Other priapism: Secondary | ICD-10-CM | POA: Diagnosis not present

## 2018-07-10 DIAGNOSIS — Z6839 Body mass index (BMI) 39.0-39.9, adult: Secondary | ICD-10-CM | POA: Diagnosis not present

## 2018-07-10 DIAGNOSIS — E785 Hyperlipidemia, unspecified: Secondary | ICD-10-CM | POA: Diagnosis not present

## 2018-07-10 DIAGNOSIS — G4730 Sleep apnea, unspecified: Secondary | ICD-10-CM | POA: Insufficient documentation

## 2018-07-10 DIAGNOSIS — Z302 Encounter for sterilization: Secondary | ICD-10-CM

## 2018-07-10 HISTORY — PX: CIRCUMCISION: SHX1350

## 2018-07-10 SURGERY — CIRCUMCISION, ADULT
Anesthesia: Monitor Anesthesia Care | Wound class: Clean Contaminated

## 2018-07-10 MED ORDER — LIDOCAINE HCL (PF) 1 % IJ SOLN
INTRAMUSCULAR | Status: AC
  Filled 2018-07-10: qty 30

## 2018-07-10 MED ORDER — FENTANYL CITRATE (PF) 100 MCG/2ML IJ SOLN
INTRAMUSCULAR | Status: AC
  Filled 2018-07-10: qty 2

## 2018-07-10 MED ORDER — PHENYLEPHRINE HCL 10 MG/ML IJ SOLN
INTRAMUSCULAR | Status: AC
  Filled 2018-07-10: qty 1

## 2018-07-10 MED ORDER — LIDOCAINE HCL 1 % IJ SOLN
INTRAMUSCULAR | Status: DC | PRN
  Administered 2018-07-10: 10 mL

## 2018-07-10 MED ORDER — ACETAMINOPHEN 10 MG/ML IV SOLN
INTRAVENOUS | Status: AC
  Filled 2018-07-10: qty 100

## 2018-07-10 MED ORDER — KETOROLAC TROMETHAMINE 30 MG/ML IJ SOLN
INTRAMUSCULAR | Status: DC | PRN
  Administered 2018-07-10: 30 mg via INTRAVENOUS

## 2018-07-10 MED ORDER — FENTANYL CITRATE (PF) 100 MCG/2ML IJ SOLN
INTRAMUSCULAR | Status: DC | PRN
  Administered 2018-07-10 (×2): 25 ug via INTRAVENOUS

## 2018-07-10 MED ORDER — FENTANYL CITRATE (PF) 100 MCG/2ML IJ SOLN: 25.0000 ug | INTRAMUSCULAR | Status: DC | PRN

## 2018-07-10 MED ORDER — PROPOFOL 500 MG/50ML IV EMUL
INTRAVENOUS | Status: AC
  Filled 2018-07-10: qty 50

## 2018-07-10 MED ORDER — LIDOCAINE HCL (CARDIAC) PF 100 MG/5ML IV SOSY
PREFILLED_SYRINGE | INTRAVENOUS | Status: DC | PRN
  Administered 2018-07-10: 60 mg via INTRAVENOUS

## 2018-07-10 MED ORDER — DEXAMETHASONE SODIUM PHOSPHATE 10 MG/ML IJ SOLN
INTRAMUSCULAR | Status: AC
  Filled 2018-07-10: qty 1

## 2018-07-10 MED ORDER — FAMOTIDINE 20 MG PO TABS
20.0000 mg | ORAL_TABLET | Freq: Once | ORAL | Status: AC
  Administered 2018-07-10: 20 mg via ORAL

## 2018-07-10 MED ORDER — CEFAZOLIN SODIUM-DEXTROSE 2-4 GM/100ML-% IV SOLN
INTRAVENOUS | Status: AC
  Filled 2018-07-10: qty 100

## 2018-07-10 MED ORDER — SODIUM CHLORIDE 0.9 % IJ SOLN
INTRAMUSCULAR | Status: AC
  Filled 2018-07-10: qty 10

## 2018-07-10 MED ORDER — PROPOFOL 500 MG/50ML IV EMUL
INTRAVENOUS | Status: DC | PRN
  Administered 2018-07-10 (×2): via INTRAVENOUS
  Administered 2018-07-10: 100 ug/kg/min via INTRAVENOUS
  Administered 2018-07-10: 08:00:00 via INTRAVENOUS

## 2018-07-10 MED ORDER — MIDAZOLAM HCL 2 MG/2ML IJ SOLN
INTRAMUSCULAR | Status: DC | PRN
  Administered 2018-07-10: 2 mg via INTRAVENOUS

## 2018-07-10 MED ORDER — ACETAMINOPHEN 10 MG/ML IV SOLN
INTRAVENOUS | Status: DC | PRN
  Administered 2018-07-10: 1000 mg via INTRAVENOUS

## 2018-07-10 MED ORDER — ONDANSETRON HCL 4 MG/2ML IJ SOLN
INTRAMUSCULAR | Status: AC
  Filled 2018-07-10: qty 2

## 2018-07-10 MED ORDER — ONDANSETRON HCL 4 MG/2ML IJ SOLN
INTRAMUSCULAR | Status: DC | PRN
  Administered 2018-07-10: 4 mg via INTRAVENOUS

## 2018-07-10 MED ORDER — KETOROLAC TROMETHAMINE 30 MG/ML IJ SOLN
INTRAMUSCULAR | Status: AC
  Filled 2018-07-10: qty 1

## 2018-07-10 MED ORDER — OXYCODONE-ACETAMINOPHEN 5-325 MG PO TABS: 1.0000 | ORAL_TABLET | Freq: Four times a day (QID) | ORAL | 0 refills | Status: DC | PRN

## 2018-07-10 MED ORDER — FAMOTIDINE 20 MG PO TABS
ORAL_TABLET | ORAL | Status: AC
  Administered 2018-07-10: 20 mg via ORAL
  Filled 2018-07-10: qty 1

## 2018-07-10 MED ORDER — BUPIVACAINE HCL (PF) 0.5 % IJ SOLN
INTRAMUSCULAR | Status: AC
  Filled 2018-07-10: qty 30

## 2018-07-10 MED ORDER — BUPIVACAINE HCL (PF) 0.25 % IJ SOLN
INTRAMUSCULAR | Status: AC
  Filled 2018-07-10: qty 30

## 2018-07-10 MED ORDER — ONDANSETRON HCL 4 MG/2ML IJ SOLN: 4.0000 mg | Freq: Once | INTRAMUSCULAR | Status: DC | PRN

## 2018-07-10 MED ORDER — LACTATED RINGERS IV SOLN
INTRAVENOUS | Status: DC
  Administered 2018-07-10: 07:00:00 via INTRAVENOUS

## 2018-07-10 MED ORDER — DEXAMETHASONE SODIUM PHOSPHATE 10 MG/ML IJ SOLN
INTRAMUSCULAR | Status: DC | PRN
  Administered 2018-07-10: 10 mg via INTRAVENOUS

## 2018-07-10 MED ORDER — LIDOCAINE HCL URETHRAL/MUCOSAL 2 % EX GEL
CUTANEOUS | Status: AC
  Filled 2018-07-10: qty 5

## 2018-07-10 MED ORDER — MIDAZOLAM HCL 2 MG/2ML IJ SOLN
INTRAMUSCULAR | Status: AC
  Filled 2018-07-10: qty 2

## 2018-07-10 MED ORDER — PROPOFOL 10 MG/ML IV BOLUS
INTRAVENOUS | Status: AC
  Filled 2018-07-10: qty 40

## 2018-07-10 MED ORDER — EPHEDRINE SULFATE 50 MG/ML IJ SOLN
INTRAMUSCULAR | Status: AC
  Filled 2018-07-10: qty 1

## 2018-07-10 MED ORDER — PROPOFOL 10 MG/ML IV BOLUS
INTRAVENOUS | Status: AC
  Filled 2018-07-10: qty 20

## 2018-07-10 MED ORDER — ROCURONIUM BROMIDE 100 MG/10ML IV SOLN
INTRAVENOUS | Status: AC
  Filled 2018-07-10: qty 1

## 2018-07-10 MED ORDER — BACITRACIN ZINC 500 UNIT/GM EX OINT
TOPICAL_OINTMENT | CUTANEOUS | Status: AC
  Filled 2018-07-10: qty 28.35

## 2018-07-10 SURGICAL SUPPLY — 30 items
BLADE CLIPPER SURG (BLADE) ×2 IMPLANT
BLADE SURG 15 STRL LF DISP TIS (BLADE) ×1 IMPLANT
BLADE SURG 15 STRL SS (BLADE) ×2
BNDG CMPR 75X21 PLY HI ABS (MISCELLANEOUS)
BNDG COHESIVE 1X5 TAN NS LF (GAUZE/BANDAGES/DRESSINGS) IMPLANT
CANISTER SUCT 1200ML W/VALVE (MISCELLANEOUS) ×2 IMPLANT
CHLORAPREP W/TINT 26ML (MISCELLANEOUS) ×2 IMPLANT
DRAPE LAPAROTOMY 77X122 PED (DRAPES) ×2 IMPLANT
ELECT REM PT RETURN 9FT ADLT (ELECTROSURGICAL) ×2
ELECTRODE REM PT RTRN 9FT ADLT (ELECTROSURGICAL) ×1 IMPLANT
GAUZE PETROLATUM 1 X8 (GAUZE/BANDAGES/DRESSINGS) IMPLANT
GAUZE STRETCH 2X75IN STRL (MISCELLANEOUS) ×1 IMPLANT
GLOVE BIO SURGEON STRL SZ8 (GLOVE) ×4 IMPLANT
GLOVE BIOGEL PI IND STRL 6.5 (GLOVE) IMPLANT
GLOVE BIOGEL PI INDICATOR 6.5 (GLOVE) ×1
GOWN STRL REUS W/ TWL LRG LVL3 (GOWN DISPOSABLE) ×2 IMPLANT
GOWN STRL REUS W/TWL LRG LVL3 (GOWN DISPOSABLE) ×4
KIT TURNOVER KIT A (KITS) ×2 IMPLANT
LABEL OR SOLS (LABEL) ×1 IMPLANT
NEEDLE HYPO 25X1 1.5 SAFETY (NEEDLE) ×2 IMPLANT
NS IRRIG 500ML POUR BTL (IV SOLUTION) ×2 IMPLANT
PACK BASIN MINOR ARMC (MISCELLANEOUS) ×2 IMPLANT
SOL PREP PVP 2OZ (MISCELLANEOUS) ×2
SOLUTION PREP PVP 2OZ (MISCELLANEOUS) ×1 IMPLANT
SPONGE XRAY 4X4 16PLY STRL (MISCELLANEOUS) ×1 IMPLANT
STRETCH NET 2 107126 (MISCELLANEOUS) IMPLANT
SUT CHROMIC 3 0 SH 27 (SUTURE) ×2 IMPLANT
SUT CHROMIC 4 0 RB 1X27 (SUTURE) ×1 IMPLANT
SUT CHROMIC 4 0 SH 27 (SUTURE) ×2 IMPLANT
SYR 10ML LL (SYRINGE) ×2 IMPLANT

## 2018-07-10 NOTE — Anesthesia Post-op Follow-up Note (Signed)
Anesthesia QCDR form completed.        

## 2018-07-10 NOTE — Progress Notes (Signed)
Water given to drink 

## 2018-07-10 NOTE — Anesthesia Postprocedure Evaluation (Signed)
Anesthesia Post Note  Patient: [name]  Procedure(s) Performed: CIRCUMCISION ADULT (N/A )  Patient location during evaluation: PACU Anesthesia Type: General Level of consciousness: awake and alert and oriented Pain management: pain level controlled Vital Signs Assessment: post-procedure vital signs reviewed and stable Respiratory status: spontaneous breathing Cardiovascular status: blood pressure returned to baseline Anesthetic complications: no     Last Vitals:  Vitals:   07/10/18 0959 07/10/18 1045  BP: 119/75 123/80  Pulse: (!) 53 (!) 51  Resp: 16 15  Temp: 36.6 C   SpO2: 99% 99%    Last Pain:  Vitals:   07/10/18 0959  TempSrc: Temporal  PainSc: 0-No pain                 Damika Harmon

## 2018-07-10 NOTE — Op Note (Signed)
Date of procedure: 07/10/18  Preoperative diagnosis:  1. Phimosis      Postoperative diagnosis:  1. Phimosis     Procedure: 1. Circumcision  Surgeon: John Giovanni, MD  Anesthesia: MAC/penile block  Complications: None  Intraoperative findings: Grossly normal foreskin  EBL: Minimal  Specimens: None  Indication: [name] is a 47 y.o. patient with worsening phimosis and painful erections.  After reviewing the management options for treatment, he elected to proceed with the above surgical procedure(s). We have discussed the potential benefits and risks of the procedure, side effects of the proposed treatment, the likelihood of the patient achieving the goals of the procedure, and any potential problems that might occur during the procedure or recuperation. Informed consent has been obtained.  Description of procedure:  The patient was taken to the operating room and sedation was achieved by anesthesia a dorsal penile.  The patient was placed in the dorsal lithotomy position, prepped and draped in the usual sterile fashion, and preoperative antibiotics were administered. A preoperative time-out was performed. A dorsal penile and ring block was then performed with 10 mL of 50/50 mixture of 0.5% plain Sensorcaine/1% plain Xylocaine  The outline of the corona radiata was marked circumferentially along the outer prepuce.  A circumferential incision was made following this line.  The prepuce was retracted and a second circumferential incision was made approximately 5 mm proximal to the corona radiata.  The intervening sleeve of preputial tissue was sharply excised.  Hemostasis was obtained with cautery.  The frenulum was reapproximated with a 4-0 running chromic suture.  The proximal and distal skin edges were then reapproximated with interrupted 3-0 chromic suture in quadrants.  A dressing of Vaseline gauze, Kling and stretch net was then placed  The patient was transported to PACU  in stable condition.    John Giovanni, M.D.

## 2018-07-10 NOTE — Anesthesia Preprocedure Evaluation (Signed)
Anesthesia Evaluation  Patient identified by MRN, date of birth, ID band Patient awake    Reviewed: Allergy & Precautions, NPO status , Patient's Chart, lab work & pertinent test results  History of Anesthesia Complications (+) Family history of anesthesia reaction and history of anesthetic complications  Airway Mallampati: III  TM Distance: >3 FB     Dental   Pulmonary sleep apnea ,    Pulmonary exam normal        Cardiovascular negative cardio ROS Normal cardiovascular exam+ Valvular Problems/Murmurs      Neuro/Psych Anxiety  Neuromuscular disease    GI/Hepatic Neg liver ROS, Rectal bleeding Rectal polyp   Endo/Other  negative endocrine ROSMorbid obesity  Renal/GU negative Renal ROS     Musculoskeletal negative musculoskeletal ROS (+)   Abdominal Normal abdominal exam  (+)   Peds negative pediatric ROS (+)  Hematology negative hematology ROS (+)   Anesthesia Other Findings Past Medical History: No date: Carpal tunnel syndrome     Comment:  bileteral - "Minor" 9476: Complication of anesthesia     Comment:  ran a fever after sinus surgery.  In [place]. No date: Family history of adverse reaction to anesthesia     Comment:  MOM-UNSURE WHAT HAPPENED WITH HIS MOM No date: Heart murmur No date: Hyperlipidemia No date: Mitral valve prolapse 01/24/2012: Personal history of colonic polyps = adenoma     Comment:  5 mm adenoma removed 01/2012 Carlean Purl  No date: Rectal bleeding     Comment:  polyp removed 01/19/2012 pre canerous No date: Right bundle branch block No date: Sleep apnea     Comment:  USES CPAP  Reproductive/Obstetrics                             Anesthesia Physical Anesthesia Plan  ASA: III  Anesthesia Plan: MAC and General   Post-op Pain Management:    Induction:   PONV Risk Score and Plan: TIVA  Airway Management Planned: Nasal Cannula  Additional Equipment:    Intra-op Plan:   Post-operative Plan:   Informed Consent: I have reviewed the patients History and Physical, chart, labs and discussed the procedure including the risks, benefits and alternatives for the proposed anesthesia with the patient or authorized representative who has indicated his/her understanding and acceptance.   Dental advisory given  Plan Discussed with: CRNA and Surgeon  Anesthesia Plan Comments:         Anesthesia Quick Evaluation

## 2018-07-10 NOTE — Transfer of Care (Signed)
Immediate Anesthesia Transfer of Care Note  Patient: [name]  Procedure(s) Performed: CIRCUMCISION ADULT (N/A )  Patient Location: PACU  Anesthesia Type:General  Level of Consciousness: sedated  Airway & Oxygen Therapy: Patient Spontanous Breathing and Patient connected to face mask oxygen  Post-op Assessment: Report given to RN and Post -op Vital signs reviewed and stable  Post vital signs: Reviewed and stable  Last Vitals:  Vitals Value Taken Time  BP 101/72 07/10/2018  9:11 AM  Temp 36.4 C 07/10/2018  9:11 AM  Pulse 69 07/10/2018  9:12 AM  Resp 15 07/10/2018  9:12 AM  SpO2 98 % 07/10/2018  9:12 AM  Vitals shown include unvalidated device data.  Last Pain:  Vitals:   07/10/18 0911  TempSrc:   PainSc: Asleep         Complications: No apparent anesthesia complications

## 2018-07-10 NOTE — Discharge Instructions (Signed)
AMBULATORY SURGERY  °DISCHARGE INSTRUCTIONS ° ° °1) The drugs that you were given will stay in your system until tomorrow so for the next 24 hours you should not: ° °A) Drive an automobile °B) Make any legal decisions °C) Drink any alcoholic beverage ° ° °2) You may resume regular meals tomorrow.  Today it is better to start with liquids and gradually work up to solid foods. ° °You may eat anything you prefer, but it is better to start with liquids, then soup and crackers, and gradually work up to solid foods. ° ° °3) Please notify your doctor immediately if you have any unusual bleeding, trouble breathing, redness and pain at the surgery site, drainage, fever, or pain not relieved by medication. ° ° ° °4) Additional Instructions: ° ° ° ° ° ° ° °Please contact your physician with any problems or Same Day Surgery at [phone], Monday through Friday 6 am to 4 pm, or [facility] Main number at [phone]. °

## 2018-07-10 NOTE — Anesthesia Procedure Notes (Deleted)
Performed by: Wess Botts, RN

## 2018-07-10 NOTE — Interval H&P Note (Signed)
History and Physical Interval Note:  07/10/2018 7:07 AM  [name]  has presented today for surgery, with the diagnosis of phimosis, desire for sterilization  The various methods of treatment have been discussed with the patient and family. After consideration of risks, benefits and other options for treatment, the patient has consented to  Procedure(s) with comments: CIRCUMCISION ADULT (N/A) - MAC with local as a surgical intervention .  The patient's history has been reviewed, patient examined, no change in status, stable for surgery.  I have reviewed the patient's chart and labs.  Questions were answered to the patient's satisfaction.     [place]

## 2018-07-22 NOTE — Progress Notes (Signed)
Cardiology Office Note  Date:  07/23/2018   ID:  [name], DOB March 29, 1971, MRN [id number]  PCP:  Venia Carbon, MD   Chief Complaint  Patient presents with  . Other    Past due 12 month follow up. Patient denies chest pain and SOB at this time. Patient was last seen 10/10/2016. Patients wife would like a cholesterol check. Meds reviewed verbally with patient.     HPI:  [name] is a Pleasant 47 year old gentleman with  morbid obesity, runs a local restaurant,  severe stress, long work hours,   Prior hx of chest pain and shortness of breath.  He presents for f/u of his  chest pain symptoms  Bone spur, cast on right foot Starting physical therapy this week Heatrtburn better without soda Thinks he has lost some weight Denies any chest pain or shortness of breath on exertion Would like to check some lab work on his visit today in preparation for primary care visit September 2019  No recent episodes of dizziness  He denies any significant family history of coronary artery disease Reports that his mother died of MS, father still alive, no coronary disease  Wife takes care of 8 children, many of them have health conditions. He works long days, very hard in intensity,  Previously reported periodic headaches that would come and go Some nocturia, poor sleep hygiene Does not do any regular exercise program Previously withstress eating  Previous records reviewed stress test 2 years ago, 08/2014, no ischemia Echo 11/2014 NL  cardiac function  EKG personally reviewed by myself on todays visit  shows sinus rhythm and RBBB, rate 60 bpm  He has chronic Achilles problems, had surgery left ankle Weight >300 pounds Non smoker, no diabetes   PMH:   has a past medical history of Carpal tunnel syndrome, Complication of anesthesia (1996), Family history of adverse reaction to anesthesia, Heart murmur, Hyperlipidemia, Mitral valve prolapse, Personal history of colonic  polyps = adenoma (01/24/2012), Rectal bleeding, Right bundle branch block, and Sleep apnea.  PSH:    Past Surgical History:  Procedure Laterality Date  . ACHILLES TENDON SURGERY Right 06/12/2018   Procedure: RIGHT ACHILLES TENDON REPAIR AND EXCISION OF HEEL SPUR;  Surgeon: Melrose Nakayama, MD;  Location: [facility];  Service: Orthopedics;  Laterality: Right;  . CHONDROPLASTY  02/27/2012   Procedure: CHONDROPLASTY;  Surgeon: Alta Corning, MD;  Location: [facility];  Service: Orthopedics;  Laterality: Right;  patella-femoral joint  . CIRCUMCISION N/A 07/10/2018   Procedure: CIRCUMCISION ADULT;  Surgeon: Abbie Sons, MD;  Location: [facility];  Service: Urology;  Laterality: N/A;  MAC with local  . COLONOSCOPY    . HEEL SPUR SURGERY Left 2016  . KNEE ARTHROSCOPY Left 3/16  . KNEE ARTHROSCOPY Left 03/10/2017   Procedure: ARTHROSCOPY KNEE;  Surgeon: Dorna Leitz, MD;  Location: [facility];  Service: Orthopedics;  Laterality: Left;  . LASIK  2006   bilateral  . NASAL TURBINATE REDUCTION  1996    No current outpatient medications on file.   No current facility-administered medications for this visit.      Allergies:   Patient has no known allergies.   Social History:  The patient  reports that he has never smoked. He has never used smokeless tobacco. He reports that he drinks alcohol. He reports that he does not use drugs.   Family History:   family history includes Anesthesia problems in his mother; Arthritis in his son; Asthma in his unknown  relative; Colitis in his father; Hypertension in his mother; Multiple sclerosis in his mother.    Review of Systems: Review of Systems  Constitutional: Negative.   Respiratory: Negative.   Cardiovascular: Negative.   Gastrointestinal: Negative.   Musculoskeletal: Positive for joint pain.  Neurological: Negative.   Psychiatric/Behavioral: Negative.   All other systems reviewed and are negative.    PHYSICAL EXAM: VS:  BP 136/78 (BP Location: Left Arm,  Patient Position: Sitting, Cuff Size: Large)   Pulse 60   Ht _0  (1.93 m)   Wt (!) 313 lb 4 oz (142.1 kg) Comment: Patient had walking boot  BMI 38.13 kg/m  , BMI Body mass index is 38.13 kg/m. Constitutional:  oriented to person, place, and time. No distress.  HENT:  Head: Normocephalic and atraumatic.  Eyes:  no discharge. No scleral icterus.  Neck: Normal range of motion. Neck supple. No JVD present.  Cardiovascular: Normal rate, regular rhythm, normal heart sounds and intact distal pulses. Exam reveals no gallop and no friction rub. No edema No murmur heard. Pulmonary/Chest: Effort normal and breath sounds normal. No stridor. No respiratory distress.  no wheezes.  no rales.  no tenderness.  Abdominal: Soft.  no distension.  no tenderness.  Musculoskeletal: Normal range of motion.  no  tenderness or deformity.  Neurological:  normal muscle tone. Coordination normal. No atrophy Skin: Skin is warm and dry. No rash noted. not diaphoretic.  Psychiatric:  normal mood and affect. behavior is normal. Thought content normal.     Recent Labs: 04/16/2018: BUN 19; Creatinine, Ser 0.97; Magnesium 2.0; Potassium 4.5; Sodium 139 06/12/2018: Hemoglobin 15.1; Platelets 207    Lipid Panel Lab Results  Component Value Date   CHOL 199 05/03/2016   HDL 31.90 (L) 05/03/2016   LDLCALC 140 (H) 05/03/2016   TRIG 136.0 05/03/2016      Wt Readings from Last 3 Encounters:  07/23/18 (!) 313 lb 4 oz (142.1 kg)  07/10/18 (!) 322 lb (146.1 kg)  06/20/18 (!) 322 lb (146.1 kg)       ASSESSMENT AND PLAN:  Pure hypercholesterolemia Lab work drawn today weight is down 10 pounds after he stopped drinking soda  Morbid obesity ([place]) We have encouraged continued exercise, careful diet management in an effort to lose weight. Weight down 10 pounds  Chest tightness Denies any significant chest tightness or shortness of breath No further workup needed Previously felt to be from stress  Shortness of  breath Stable symptoms No further testing at this time  Anxiety previouslystressed given his long work hours, taking care of a children some with medical issues   Total encounter time more than 25 minutes  Greater than 50% was spent in counseling and coordination of care with the patient   Disposition:   F/U  as needed   Orders Placed This Encounter  Procedures  . Lipid panel  . HgB A1c  . Vitamin D (25 hydroxy)  . EKG 12-Lead     Signed, Esmond Plants, M.D., Ph.D. 07/23/2018  [place], [place]

## 2018-07-23 ENCOUNTER — Other Ambulatory Visit: Payer: Self-pay | Admitting: Internal Medicine

## 2018-07-23 ENCOUNTER — Ambulatory Visit (INDEPENDENT_AMBULATORY_CARE_PROVIDER_SITE_OTHER): Payer: BLUE CROSS/BLUE SHIELD | Admitting: Cardiovascular Disease

## 2018-07-23 ENCOUNTER — Encounter: Payer: Self-pay | Admitting: Cardiovascular Disease

## 2018-07-23 DIAGNOSIS — E7800 Pure hypercholesterolemia, unspecified: Secondary | ICD-10-CM

## 2018-07-23 DIAGNOSIS — F419 Anxiety disorder, unspecified: Secondary | ICD-10-CM | POA: Diagnosis not present

## 2018-07-23 DIAGNOSIS — M899 Disorder of bone, unspecified: Secondary | ICD-10-CM | POA: Diagnosis not present

## 2018-07-23 MED ORDER — HYDROCORTISONE ACE-PRAMOXINE 2.5-1 % RE CREA: 1.0000 "application " | TOPICAL_CREAM | Freq: Three times a day (TID) | RECTAL | 0 refills | Status: DC

## 2018-07-23 NOTE — Patient Instructions (Addendum)
Medication Instructions:   No medication changes made  Labwork:  Labs today, lipids, HBA1C, Vit D  Testing/Procedures:  No further testing at this time   Follow-Up: It was a pleasure seeing you in the office today. Please call us if you have new issues that need to be addressed before your next appt.  [phone]  Your physician wants you to follow-up in:  As needed  If you need a refill on your cardiac medications before your next appointment, please call your pharmacy.  For educational health videos Log in to : [email or web address] Or : [email or web address], password : [email or web address]

## 2018-07-24 LAB — LIPID PANEL
CHOL/HDL RATIO: 4.4 ratio (ref 0.0–5.0)
Cholesterol, Total: 190 mg/dL (ref 100–199)
HDL: 43 mg/dL (ref >39)
LDL Calculated: 127 mg/dL — ABNORMAL HIGH (ref 0–99)
Triglycerides: 100 mg/dL (ref 0–149)
VLDL CHOLESTEROL CAL: 20 mg/dL (ref 5–40)

## 2018-07-24 LAB — VITAMIN D 25 HYDROXY (VIT D DEFICIENCY, FRACTURES): Vit D, 25-Hydroxy: 27.4 ng/mL — ABNORMAL LOW (ref 30.0–100.0)

## 2018-07-24 LAB — HEMOGLOBIN A1C
ESTIMATED AVERAGE GLUCOSE: 117 mg/dL
HEMOGLOBIN A1C: 5.7 % — AB (ref 4.8–5.6)

## 2018-07-26 ENCOUNTER — Telehealth: Payer: Self-pay | Admitting: Cardiovascular Disease

## 2018-07-26 NOTE — Telephone Encounter (Signed)
Patient calling to discuss recent lab testing results from 8/12  Please call

## 2018-07-26 NOTE — Telephone Encounter (Signed)
Patient notified that we will call him with the results and recommendations once reviewed by Dr Rockey Situ. He was appreciative.

## 2018-08-09 ENCOUNTER — Ambulatory Visit: Payer: BLUE CROSS/BLUE SHIELD | Admitting: Physician Assistant

## 2018-08-14 ENCOUNTER — Ambulatory Visit (INDEPENDENT_AMBULATORY_CARE_PROVIDER_SITE_OTHER): Payer: BLUE CROSS/BLUE SHIELD | Admitting: Urology

## 2018-08-14 ENCOUNTER — Encounter: Payer: Self-pay | Admitting: Urology

## 2018-08-14 VITALS — BP 110/77 | HR 98 | Ht 76.0 in | Wt 315.4 lb

## 2018-08-14 DIAGNOSIS — Z9889 Other specified postprocedural states: Secondary | ICD-10-CM

## 2018-08-14 NOTE — Progress Notes (Signed)
08/14/2018 3:50 PM   [name] 05-15-1971 [id number]  Referring provider: Venia Carbon, MD [address] [place], [place] [address]  Chief Complaint  Patient presents with  . Routine Post Op    HPI: 47 year old male status post circumcision on 07/10/2018 for phimosis.  He has done well postoperatively.  He has noted some dry skin on the glans penis and wanted to know if this was part of the normal healing process.  He has no pain or discomfort.   PMH: Past Medical History:  Diagnosis Date  . Carpal tunnel syndrome    bileteral - "Minor"  . Complication of anesthesia 1996   ran a fever after sinus surgery.  In [place].  . Family history of adverse reaction to anesthesia    MOM-UNSURE WHAT HAPPENED WITH HIS MOM  . Heart murmur   . Hyperlipidemia   . Mitral valve prolapse   . Personal history of colonic polyps = adenoma 01/24/2012   5 mm adenoma removed 01/2012 Carlean Purl   . Rectal bleeding    polyp removed 01/19/2012 pre canerous  . Right bundle branch block   . Sleep apnea    USES CPAP    Surgical History: Past Surgical History:  Procedure Laterality Date  . ACHILLES TENDON SURGERY Right 06/12/2018   Procedure: RIGHT ACHILLES TENDON REPAIR AND EXCISION OF HEEL SPUR;  Surgeon: Melrose Nakayama, MD;  Location: [facility];  Service: Orthopedics;  Laterality: Right;  . CHONDROPLASTY  02/27/2012   Procedure: CHONDROPLASTY;  Surgeon: Alta Corning, MD;  Location: [facility];  Service: Orthopedics;  Laterality: Right;  patella-femoral joint  . CIRCUMCISION N/A 07/10/2018   Procedure: CIRCUMCISION ADULT;  Surgeon: Abbie Sons, MD;  Location: [facility];  Service: Urology;  Laterality: N/A;  MAC with local  . COLONOSCOPY    . HEEL SPUR SURGERY Left 2016  . KNEE ARTHROSCOPY Left 3/16  . KNEE ARTHROSCOPY Left 03/10/2017   Procedure: ARTHROSCOPY KNEE;  Surgeon: Dorna Leitz, MD;  Location: [facility];  Service: Orthopedics;  Laterality: Left;  . LASIK  2006   bilateral  . NASAL  TURBINATE REDUCTION  1996    Home Medications:  Allergies as of 08/14/2018   No Known Allergies     Medication List        Accurate as of 08/14/18  3:50 PM. Always use your most recent med list.          hydrocortisone-pramoxine 2.5-1 % rectal cream Commonly known as:  ANALPRAM-HC Place 1 application rectally 3 (three) times daily.   Ibuprofen 200 MG Caps Take by mouth.   MULTIVITAMIN ADULT PO Take by mouth daily.       Allergies: No Known Allergies  Family History: Family History  Problem Relation Age of Onset  . Multiple sclerosis Mother   . Anesthesia problems Mother   . Hypertension Mother   . Colitis Father        questionable  . Asthma Unknown        children; seasonal  . Arthritis Son        juvinile rheumatoid  . Colon cancer Neg Hx     Social History:  reports that he has never smoked. He has never used smokeless tobacco. He reports that he drinks alcohol. He reports that he does not use drugs.  ROS: UROLOGY Frequent Urination?: No Hard to postpone urination?: No Burning/pain with urination?: No Get up at night to urinate?: No Leakage of urine?: No Urine stream starts  and stops?: No Trouble starting stream?: No Do you have to strain to urinate?: No Blood in urine?: No Urinary tract infection?: No Sexually transmitted disease?: No Injury to kidneys or bladder?: No Painful intercourse?: No Weak stream?: No Erection problems?: No Penile pain?: No  Gastrointestinal Nausea?: No Vomiting?: No Indigestion/heartburn?: No Diarrhea?: No Constipation?: No  Constitutional Fever: No Night sweats?: No Weight loss?: No Fatigue?: No  Skin Skin rash/lesions?: No Itching?: No  Eyes Blurred vision?: No Double vision?: No  Ears/Nose/Throat Sore throat?: No Sinus problems?: No  Hematologic/Lymphatic Swollen glands?: No Easy bruising?: No  Cardiovascular Leg swelling?: No Chest pain?: No  Respiratory Cough?: No Shortness of breath?:  No  Endocrine Excessive thirst?: No  Musculoskeletal Back pain?: No Joint pain?: Yes  Neurological Headaches?: No Dizziness?: No  Psychologic Depression?: No Anxiety?: No  Physical Exam: BP 110/77 (BP Location: Left Arm, Patient Position: Sitting, Cuff Size: Large)   Pulse 98   Ht 6' 4"  (1.93 m)   Wt (!) 315 lb 6.4 oz (143.1 kg)   BMI 38.39 kg/m   Constitutional:  Alert and oriented, No acute distress. Respiratory: Normal respiratory effort, no increased work of breathing. GU: Penis-circumcision site is healing nicely.  There is some peeling skin and at the distal glans and mild edema at the meatus.   Assessment & Plan:   Doing well status post circumcision for phimosis.  He was informed his peeling skin at the distal glans will heal and he may apply antibiotic ointment as needed.  He also complains of periodic scrotal itching with soap.  I recommended he try a milder soap and to make sure the areas rinsed.  If it continues he does have a regular dermatologist he will see for further recommendations.  Follow-up prn   Abbie Sons, MD  [facility] [address] [place], [place] [address] [phone]

## 2018-08-20 ENCOUNTER — Ambulatory Visit: Payer: BLUE CROSS/BLUE SHIELD | Admitting: Physician Assistant

## 2018-08-21 ENCOUNTER — Ambulatory Visit (INDEPENDENT_AMBULATORY_CARE_PROVIDER_SITE_OTHER): Payer: BLUE CROSS/BLUE SHIELD | Admitting: Internal Medicine

## 2018-08-21 ENCOUNTER — Encounter: Payer: Self-pay | Admitting: Internal Medicine

## 2018-08-21 VITALS — BP 114/86 | HR 73 | Temp 98.2°F | Ht 74.0 in | Wt 312.0 lb

## 2018-08-21 DIAGNOSIS — E669 Obesity, unspecified: Secondary | ICD-10-CM

## 2018-08-21 DIAGNOSIS — Z0000 Encounter for general adult medical examination without abnormal findings: Secondary | ICD-10-CM

## 2018-08-21 DIAGNOSIS — E66812 Obesity, class 2: Secondary | ICD-10-CM

## 2018-08-21 NOTE — Progress Notes (Signed)
Subjective:    Patient ID: [name], male    DOB: August 23, 1971, 47 y.o.   MRN: [id number]  HPI Here for physical  Recent Achilles repair on right Doing okay but increasing pain with his PT Got injection for plantar fasciitis and putting PT on hold for now Left knee hurting more now---change in gait  Using scooter again  Usual stress Some issues with wife still He is going back to [place]--father not doing well  Only taking son with him But is bringing wife to [facility] for her birthday before this trip  Current Outpatient Medications on File Prior to Visit  Medication Sig Dispense Refill  . Ibuprofen 200 MG CAPS Take by mouth.    . Multiple Vitamins-Minerals (MULTIVITAMIN ADULT PO) Take by mouth daily.     No current facility-administered medications on file prior to visit.     No Known Allergies  Past Medical History:  Diagnosis Date  . Carpal tunnel syndrome    bileteral - "Minor"  . Complication of anesthesia 1996   ran a fever after sinus surgery.  In [place].  . Family history of adverse reaction to anesthesia    MOM-UNSURE WHAT HAPPENED WITH HIS MOM  . Heart murmur   . Hyperlipidemia   . Mitral valve prolapse   . Personal history of colonic polyps = adenoma 01/24/2012   5 mm adenoma removed 01/2012 Carlean Purl   . Rectal bleeding    polyp removed 01/19/2012 pre canerous  . Right bundle branch block   . Sleep apnea    USES CPAP    Past Surgical History:  Procedure Laterality Date  . ACHILLES TENDON SURGERY Right 06/12/2018   Procedure: RIGHT ACHILLES TENDON REPAIR AND EXCISION OF HEEL SPUR;  Surgeon: Melrose Nakayama, MD;  Location: [place];  Service: Orthopedics;  Laterality: Right;  . CHONDROPLASTY  02/27/2012   Procedure: CHONDROPLASTY;  Surgeon: Alta Corning, MD;  Location: [place];  Service: Orthopedics;  Laterality: Right;  patella-femoral joint  . CIRCUMCISION N/A 07/10/2018   Procedure: CIRCUMCISION ADULT;  Surgeon: Abbie Sons, MD;  Location: [facility];   Service: Urology;  Laterality: N/A;  MAC with local  . COLONOSCOPY    . HEEL SPUR SURGERY Left 2016  . KNEE ARTHROSCOPY Left 3/16  . KNEE ARTHROSCOPY Left 03/10/2017   Procedure: ARTHROSCOPY KNEE;  Surgeon: Dorna Leitz, MD;  Location: [place];  Service: Orthopedics;  Laterality: Left;  . LASIK  2006   bilateral  . NASAL TURBINATE REDUCTION  1996    Family History  Problem Relation Age of Onset  . Multiple sclerosis Mother   . Anesthesia problems Mother   . Hypertension Mother   . Colitis Father        questionable  . Asthma Unknown        children; seasonal  . Arthritis Son        juvinile rheumatoid  . Colon cancer Neg Hx     Social History   Socioeconomic History  . Marital status: Married    Spouse name: Not on file  . Number of children: 8  . Years of education: 53  . Highest education level: Not on file  Occupational History  . 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 Financial resource strain: Not on file  . Food insecurity:    Worry: Not on file    Inability: Not on file  . Transportation needs:    Medical:  Not on file    Non-medical: Not on file  Tobacco Use  . Smoking status: Never Smoker  . Smokeless tobacco: Never Used  Substance and Sexual Activity  . Alcohol use: Yes    Alcohol/week: 0.0 standard drinks    Comment: occasional beer/wine  . Drug use: No  . Sexual activity: Yes  Lifestyle  . Physical activity:    Days per week: Not on file    Minutes per session: Not on file  . Stress: Not on file  Relationships  . Social connections:    Talks on phone: Not on file    Gets together: Not on file    Attends religious service: Not on file    Active member of club or organization: Not on file    Attends meetings of clubs or organizations: Not on file    Relationship status: Not on file  . Intimate partner violence:    Fear of current or ex partner: Not on file    Emotionally abused: Not on file     Physically abused: Not on file    Forced sexual activity: Not on file  Other Topics Concern  . 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   Review of Systems  Constitutional:       Wears seat belt Has lost a few pounds---almost 20# Trying to be careful with eating (and just drinking water)  HENT: Negative for hearing loss, tinnitus and trouble swallowing.        Recently needed tooth pulled---failed root canal. Eventually will need implant  Eyes: Negative for visual disturbance.       No diplopia or unilateral vision loss  Respiratory: Negative for cough, chest tightness and shortness of breath.   Cardiovascular: Negative for palpitations.       Some foot swelling post op Getting compression socks  Gastrointestinal: Negative for abdominal pain.       Gets heartburn--better with decreased heartburn (ranitidine helps). Better since no soda. Had appt with Dr Carlean Purl for bowel issues---canceled due to improvement with increased fiber  Genitourinary: Negative for difficulty urinating and urgency.       Circumcision healed but still with some dry skin Some scrotal itching No sexual problems  Musculoskeletal: Positive for arthralgias. Negative for back pain.  Skin:       Dermatitis on feet---- has cream from dermatologist (Dr Phillip Heal)  Allergic/Immunologic: Negative for immunocompromised state.       No recent allergy issues   Neurological: Negative for dizziness, syncope, light-headedness and headaches.  Hematological: Negative for adenopathy. Does not bruise/bleed easily.  Psychiatric/Behavioral: Negative for dysphoric mood and sleep disturbance.       Lots of stress---business/family/marriage       Objective:   Physical Exam  Constitutional: He is oriented to person, place, and time. He appears well-developed. No distress.  HENT:  Head: Normocephalic and atraumatic.  Right  Ear: External ear normal.  Left Ear: External ear normal.  Mouth/Throat: Oropharynx is clear and moist. No oropharyngeal exudate.  Eyes: Pupils are equal, round, and reactive to light. Conjunctivae are normal.  Neck: No thyromegaly present.  Cardiovascular: Normal rate, regular rhythm, normal heart sounds and intact distal pulses. Exam reveals no gallop.  No murmur heard. Respiratory: Effort normal and breath sounds normal. No respiratory distress. He has no wheezes. He has no rales.  GI: Soft. There  is no tenderness.  Genitourinary:  Genitourinary Comments: No sig rash penis or groin  Musculoskeletal: He exhibits no edema or tenderness.  Lymphadenopathy:    He has no cervical adenopathy.  Neurological: He is alert and oriented to person, place, and time.  Skin: No rash noted. No erythema.  Psychiatric: He has a normal mood and affect. His behavior is normal.           Assessment & Plan:

## 2018-08-21 NOTE — Assessment & Plan Note (Signed)
Discussed fitness Has cut out soda, etc counseled

## 2018-08-21 NOTE — Assessment & Plan Note (Signed)
Healthy but still overweight Recovering from bilateral Achilles surgery--hopes to do more exercise eventually Will get flu vaccine later in fall Had colon

## 2018-09-10 DIAGNOSIS — M2550 Pain in unspecified joint: Secondary | ICD-10-CM

## 2018-09-10 NOTE — Telephone Encounter (Signed)
Please call him tomorrow to set up time for the ordered blood work

## 2018-09-11 DIAGNOSIS — M2550 Pain in unspecified joint: Secondary | ICD-10-CM

## 2018-09-12 ENCOUNTER — Other Ambulatory Visit (INDEPENDENT_AMBULATORY_CARE_PROVIDER_SITE_OTHER): Payer: BLUE CROSS/BLUE SHIELD

## 2018-09-12 ENCOUNTER — Ambulatory Visit: Payer: BLUE CROSS/BLUE SHIELD | Admitting: Physician Assistant

## 2018-09-12 ENCOUNTER — Encounter: Payer: Self-pay | Admitting: Physician Assistant

## 2018-09-12 VITALS — BP 122/80 | HR 64 | Ht 76.0 in | Wt 315.7 lb

## 2018-09-12 DIAGNOSIS — R197 Diarrhea, unspecified: Secondary | ICD-10-CM

## 2018-09-12 DIAGNOSIS — M25579 Pain in unspecified ankle and joints of unspecified foot: Secondary | ICD-10-CM | POA: Diagnosis not present

## 2018-09-12 DIAGNOSIS — R5383 Other fatigue: Secondary | ICD-10-CM | POA: Diagnosis not present

## 2018-09-12 DIAGNOSIS — L989 Disorder of the skin and subcutaneous tissue, unspecified: Secondary | ICD-10-CM

## 2018-09-12 LAB — SEDIMENTATION RATE: Sed Rate: 12 mm/hr (ref 0–15)

## 2018-09-12 LAB — TSH: TSH: 2.4 u[IU]/mL (ref 0.35–4.50)

## 2018-09-12 LAB — HEMOGLOBIN A1C: Hgb A1c MFr Bld: 5.4 % (ref 4.6–6.5)

## 2018-09-12 NOTE — Patient Instructions (Signed)
Your provider has requested that you go to the basement level for lab work before leaving today. Press "B" on the elevator. The lab is located at the first door on the left as you exit the elevator.  Try Preperation H suppositories at bedtime for 5-7 nights as needed for episodes of rectal bleeding.   Normal BMI (Body Mass Index- based on height and weight) is between 19 and 25. Your BMI today is Body mass index is 38.43 kg/m. Marland Kitchen Please consider follow up  regarding your BMI with your Primary Care Provider.

## 2018-09-13 ENCOUNTER — Other Ambulatory Visit: Payer: Self-pay | Admitting: Family Medicine

## 2018-09-13 ENCOUNTER — Telehealth: Payer: Self-pay | Admitting: Family Medicine

## 2018-09-13 ENCOUNTER — Ambulatory Visit (HOSPITAL_BASED_OUTPATIENT_CLINIC_OR_DEPARTMENT_OTHER)
Admission: RE | Admit: 2018-09-13 | Discharge: 2018-09-13 | Disposition: A | Discharge status: Home / Self Care | Payer: BLUE CROSS/BLUE SHIELD | Source: Ambulatory Visit | Attending: Family Medicine | Admitting: Family Medicine

## 2018-09-13 ENCOUNTER — Encounter: Payer: Self-pay | Admitting: Physician Assistant

## 2018-09-13 ENCOUNTER — Encounter: Payer: Self-pay | Admitting: Family Medicine

## 2018-09-13 ENCOUNTER — Ambulatory Visit (INDEPENDENT_AMBULATORY_CARE_PROVIDER_SITE_OTHER): Payer: BLUE CROSS/BLUE SHIELD | Admitting: Family Medicine

## 2018-09-13 ENCOUNTER — Other Ambulatory Visit: Payer: BLUE CROSS/BLUE SHIELD

## 2018-09-13 ENCOUNTER — Ambulatory Visit: Payer: Self-pay | Admitting: *Deleted

## 2018-09-13 ENCOUNTER — Encounter: Payer: Self-pay | Admitting: Urology

## 2018-09-13 ENCOUNTER — Telehealth: Payer: Self-pay | Admitting: Internal Medicine

## 2018-09-13 VITALS — BP 132/78 | HR 88 | Ht 76.0 in

## 2018-09-13 DIAGNOSIS — N210 Calculus in bladder: Secondary | ICD-10-CM | POA: Insufficient documentation

## 2018-09-13 DIAGNOSIS — R1011 Right upper quadrant pain: Secondary | ICD-10-CM | POA: Diagnosis not present

## 2018-09-13 DIAGNOSIS — R300 Dysuria: Secondary | ICD-10-CM | POA: Diagnosis not present

## 2018-09-13 LAB — ANA: ANA: NEGATIVE

## 2018-09-13 LAB — CELIAC PANEL 10
Antigliadin Abs, IgA: 3 units (ref 0–19)
Endomysial IgA: NEGATIVE
Gliadin IgG: 3 units (ref 0–19)
IGA/IMMUNOGLOBULIN A, SERUM: 125 mg/dL (ref 90–386)
Tissue Transglut Ab: 4 U/mL (ref 0–5)
Transglutaminase IgA: <2 U/mL (ref 0–3)

## 2018-09-13 LAB — RHEUMATOID FACTOR

## 2018-09-13 MED ORDER — KETOROLAC TROMETHAMINE 60 MG/2ML IM SOLN
60.0000 mg | Freq: Once | INTRAMUSCULAR | Status: AC
  Administered 2018-09-13: 60 mg via INTRAMUSCULAR

## 2018-09-13 NOTE — Progress Notes (Signed)
[name] - 47 y.o. male MRN [id number]  Date of birth: 21-Mar-1971  SUBJECTIVE:  Including CC & ROS.  Chief Complaint  Patient presents with  . Dysuria    [name] is a 47 y.o. male that is presenting with hematuria. Ongoing for one day. He urinated this morning and noticed dark urine in the toilet. Admits to a burning pain while urinating. Abdominal pain right lower quadrant-described as a sharp stabbing pain. Denies fevers or body aches.  No prior history of kidney stones.  Has been drinking a lot of water.  Has not taken any medications for this.  The pain is severe in nature.   Review of Systems  Constitutional: Negative for fever.  HENT: Negative for congestion.   Respiratory: Negative for cough.   Cardiovascular: Negative for chest pain.  Gastrointestinal: Positive for abdominal pain.  Genitourinary: Positive for hematuria.  Musculoskeletal: Negative for joint swelling.  Skin: Negative for color change.  Neurological: Negative for weakness.  Hematological: Negative for adenopathy.  Psychiatric/Behavioral: Negative for agitation.    HISTORY: Past Medical, Surgical, Social, and Family History Reviewed & Updated per EMR.   Pertinent Historical Findings include:  Past Medical History:  Diagnosis Date  . Carpal tunnel syndrome    bileteral - "Minor"  . Complication of anesthesia 1996   ran a fever after sinus surgery.  In [place].  . Family history of adverse reaction to anesthesia    MOM-UNSURE WHAT HAPPENED WITH HIS MOM  . Heart murmur   . Hyperlipidemia   . Mitral valve prolapse   . Personal history of colonic polyps = adenoma 01/24/2012   5 mm adenoma removed 01/2012 Carlean Purl   . Rectal bleeding    polyp removed 01/19/2012 pre canerous  . Right bundle branch block   . Sleep apnea    USES CPAP    Past Surgical History:  Procedure Laterality Date  . ACHILLES TENDON SURGERY Right 06/12/2018   Procedure: RIGHT ACHILLES TENDON REPAIR AND EXCISION OF HEEL  SPUR;  Surgeon: Melrose Nakayama, MD;  Location: [place];  Service: Orthopedics;  Laterality: Right;  . CHONDROPLASTY  02/27/2012   Procedure: CHONDROPLASTY;  Surgeon: Alta Corning, MD;  Location: [place];  Service: Orthopedics;  Laterality: Right;  patella-femoral joint  . CIRCUMCISION N/A 07/10/2018   Procedure: CIRCUMCISION ADULT;  Surgeon: Abbie Sons, MD;  Location: [facility];  Service: Urology;  Laterality: N/A;  MAC with local  . COLONOSCOPY    . HEEL SPUR SURGERY Bilateral 2016  . KNEE ARTHROSCOPY Left 3/16  . KNEE ARTHROSCOPY Left 03/10/2017   Procedure: ARTHROSCOPY KNEE;  Surgeon: Dorna Leitz, MD;  Location: [place];  Service: Orthopedics;  Laterality: Left;  . LASIK  2006   bilateral  . NASAL TURBINATE REDUCTION  1996    No Known Allergies  Family History  Problem Relation Age of Onset  . Multiple sclerosis Mother   . Anesthesia problems Mother   . Hypertension Mother   . Colitis Father        questionable  . Asthma Unknown        children; seasonal  . Arthritis Son        juvinile rheumatoid  . Colon cancer Neg Hx      Social History   Socioeconomic History  . Marital status: Married    Spouse name: Not on file  . Number of children: 8  . Years of education: 83  . Highest education level: Not on file  Occupational  History  . 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 Financial resource strain: Not on file  . Food insecurity:    Worry: Not on file    Inability: Not on file  . Transportation needs:    Medical: Not on file    Non-medical: Not on file  Tobacco Use  . Smoking status: Never Smoker  . Smokeless tobacco: Never Used  Substance and Sexual Activity  . Alcohol use: Yes    Alcohol/week: 0.0 standard drinks    Comment: occasional beer/wine  . Drug use: No  . Sexual activity: Yes  Lifestyle  . Physical activity:    Days per week: Not on file    Minutes per session: Not on file  . Stress: Not on  file  Relationships  . Social connections:    Talks on phone: Not on file    Gets together: Not on file    Attends religious service: Not on file    Active member of club or organization: Not on file    Attends meetings of clubs or organizations: Not on file    Relationship status: Not on file  . Intimate partner violence:    Fear of current or ex partner: Not on file    Emotionally abused: Not on file    Physically abused: Not on file    Forced sexual activity: Not on file  Other Topics Concern  . 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     PHYSICAL EXAM:  VS: BP 132/78   Pulse 88   Ht _0  (1.93 m)   SpO2 98%   BMI 38.43 kg/m  Physical Exam Gen: NAD, alert, cooperative with exam, well-appearing ENT: normal lips, normal nasal mucosa,  Eye: normal EOM, normal conjunctiva and lids CV:  no edema, +2 pedal pulses   Resp: no accessory muscle use, non-labored,  GI: , no hernia  Skin: no rashes, no areas of induration  Neuro: normal tone, normal sensation to touch Psych:  normal insight, alert and oriented MSK: normal gait, normal strength      ASSESSMENT & PLAN:   Right upper quadrant abdominal pain Pain seems to be related to a stone. Having hematuria and pain that radiates down to his groin.  - Toradol  - CT scan  - will call with results.

## 2018-09-13 NOTE — Telephone Encounter (Signed)
Pt called with complaints of abdominal pain, and he had dark brown urine (looks like blood in his urine); he states the symptoms started about 09/13/18 at 1300; the pt also says that he has burning with urination, mild stomach pain, heartburn and nausea; recommendations made per nurse triage protocol; the pt would like to be seen in the office today and he normally sees Dr Silvio Pate; there is no availability in the office or with the provider today; pt offered and accepted appointment with Dr Clearance Coots, [place], 09/13/18 at 1500; he verbalizes understanding; will route to office for notification of this upcoming appointment.     Reason for Disposition . Side (flank) or back pain present  Answer Assessment - Initial Assessment Questions 1. COLOR of URINE: "Describe the color of the urine."  (e.g., tea-colored, pink, red, blood clots, bloody)     Dark brown with clots 2. ONSET: "When did the bleeding start?"      09/13/18 at 1300 3. EPISODES: "How many times has there been blood in the urine?" or "How many times today?"     2 4. PAIN with URINATION: "Is there any pain with passing your urine?" If so, ask: "How bad is the pain?"  (Scale 1-10; or mild, moderate, severe)    - MILD - complains slightly about urination hurting    - MODERATE - interferes with normal activities      - SEVERE - excruciating, unwilling or unable to urinate because of the pain     mild 5. FEVER: "Do you have a fever?" If so, ask: "What is your temperature, how was it measured, and when did it start?"     Not sure 6. ASSOCIATED SYMPTOMS: "Are you passing urine more frequently than usual?"     no 7. OTHER SYMPTOMS: "Do you have any other symptoms?" (e.g., back/flank pain, abdominal pain, vomiting)     Heartburn and stomach pain (mild), nausea 8. PREGNANCY: "Is there any chance you are pregnant?" "When was your last menstrual period?"     n/a  Protocols used: URINE - BLOOD IN-A-AH

## 2018-09-13 NOTE — Telephone Encounter (Signed)
Patient's wife, Lattie Haw, called and asked for the CT results and wants to know what the patient is supposed to do when the Toradol wears off. I asked if he got the Meloxicam prescription sent on 09/13/18, she says "he is not supposed to be on anti-inflammatory medications if he has to have a lithotripsy for the kidney stone, if he has one." I asked her to hold while I call the office to see if Dr. Raeford Razor is available to speak to her. I called the office and spoke to [place], [facility], she says she will check to see if Dr. Raeford Razor is still in the office. Dr. Raeford Razor is available to speak to the wife, the call was transferred to Dr. Raeford Razor.

## 2018-09-13 NOTE — Progress Notes (Signed)
  Etiology of constellation of symptoms is not clear, will rule out celiac disease, and do initial autoimmune labs.  #2 history of adenomatous colon polyps-up-to-date with colonoscopy last done July 2018 due for follow-up 2023 #3 borderline diabetes  Plan; we will check ANA, sed rate, rheumatoid factor, celiac panel and repeat hemoglobin A1c.  Patient would like to be referred to rheumatology/[facility].  Will await labs.   Amy Esterwood PA-C  Cc: Venia Carbon, MD

## 2018-09-13 NOTE — Patient Instructions (Signed)
Nice to meet you  We will call with the results from today and the CT scan.

## 2018-09-13 NOTE — Assessment & Plan Note (Signed)
Pain seems to be related to a stone. Having hematuria and pain that radiates down to his groin.  - Toradol  - CT scan  - will call with results.

## 2018-09-13 NOTE — Telephone Encounter (Signed)
Spoke with patient's wife CT results.   Rosemarie Ax, MD [facility] 09/13/2018, 5:08 PM

## 2018-09-14 ENCOUNTER — Telehealth: Payer: Self-pay | Admitting: Family Medicine

## 2018-09-14 LAB — URINALYSIS, COMPLETE
BACTERIA UA: NONE SEEN /HPF
BILIRUBIN URINE: NEGATIVE
Glucose, UA: NEGATIVE
Hyaline Cast: NONE SEEN /LPF
Ketones, ur: NEGATIVE
Leukocytes, UA: NEGATIVE
Nitrite: NEGATIVE
Protein, ur: NEGATIVE
SPECIFIC GRAVITY, URINE: 1.01 (ref 1.001–1.03)
SQUAMOUS EPITHELIAL / LPF: NONE SEEN /HPF (ref <=5)
WBC, UA: NONE SEEN /HPF (ref 0–5)
pH: <=5 (ref 5.0–8.0)

## 2018-09-14 LAB — URINE CULTURE
MICRO NUMBER: 91189741
Result:: NO GROWTH
SPECIMEN QUALITY: ADEQUATE

## 2018-09-14 NOTE — Telephone Encounter (Signed)
Left VM for patient. If he calls back please have him speak with a nurse/CMA and inform that his UA doesn't show an infection. The [facility] can report results to patient.   If any questions then please take the best time and phone number to call and I will try to call him back.   Rosemarie Ax, MD [facility] 09/14/2018, 8:25 AM

## 2018-10-24 ENCOUNTER — Encounter: Payer: Self-pay | Admitting: Neurology

## 2018-10-24 ENCOUNTER — Ambulatory Visit (INDEPENDENT_AMBULATORY_CARE_PROVIDER_SITE_OTHER): Payer: BLUE CROSS/BLUE SHIELD | Admitting: Neurology

## 2018-10-24 VITALS — BP 110/80 | HR 64 | Ht 76.0 in | Wt 321.1 lb

## 2018-10-24 DIAGNOSIS — R202 Paresthesia of skin: Secondary | ICD-10-CM

## 2018-10-24 DIAGNOSIS — M5442 Lumbago with sciatica, left side: Secondary | ICD-10-CM

## 2018-10-24 DIAGNOSIS — G8929 Other chronic pain: Secondary | ICD-10-CM | POA: Diagnosis not present

## 2018-10-24 NOTE — Progress Notes (Signed)
Follow-up Visit   Date: 10/24/18    [name] MRN: [id number] DOB: 07/12/1971   Interim History: [name] is a 47 y.o.  right-handed man with hyperlipidemia, OSA, and RBBB returning to the clinic with new complaints of low back pain radiating into the left leg.  He was previously seen here for bilateral CTS.  The patient was accompanied to the clinic by self.  History of present illness: Starting early in 2018, he began having tingling of the hands, which occurs nightly and only with certain position.  He often wakes up at night because of these symptoms and has to shake his hands awake.  Within a few minutes, his tingling resolves.  He does not have associated weakness or neck pain.  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  He reports having biceps tendonitis on the right which is being addressed by is PCP.  Patient is also very concerned about the possibility of him having multiple sclerosis because his mother had it.   He denies any paresthesias of the feet, vision loss/changes, or weakness.   UPDATE 01/16/2018: The tingling in his hands is slightly better because he has tried to reduce his work load at [employer]. He denies any weakness of the hands or neck pain.  He continues to have left low back pain radiating in tho the leg and is seeing a spine specialist for this.  MRI has been scheduled.  He is not taking any medication for his pain.  He complains that he also has to wake up at night to urinate which is new, denies any incontinence or retention.    UPDATE 10/24/2018:  He is here today with new complaints of low back pain radiating into the left leg.  He was diagnosed with lumbar spinal stenosis by Dr. Jacelyn Grip at [facility].  He was briefly on gabapentin, which was discontinued due to side effects. He was offered ESI, but declined.  He continues to have left buttocks pain  which radiates into his thigh, lower leg, and foot.  He has a lot of sharp pain over the back of the knee, which is worse with prolonged standing.  He often has to takes breaks to sit when at work because pain can be severe.  He has localized tenderness behind the knee.  He has takes hydrocodone written by Levora Dredge, which helps alleviate his pain.  He also complains of bilateral feet pain, as if walking on glass.   Medications:  Current Outpatient Medications on File Prior to Visit  Medication Sig Dispense Refill  . HYDROcodone-acetaminophen (NORCO/VICODIN) 5-325 MG tablet TK 1 T PO Q 8 TO 12 H PRN P  0  . Ibuprofen 200 MG CAPS Take 3 capsules by mouth as needed.     . Multiple Vitamins-Minerals (MULTIVITAMIN ADULT PO) Take by mouth daily.    . Vitamin D, Ergocalciferol, (DRISDOL) 50000 units CAPS capsule Take 50,000 Units by mouth every 7 (seven) days.     No current facility-administered medications on file prior to visit.     Allergies:  No Known Allergies  Review of Systems:  CONSTITUTIONAL: No fevers, chills, night sweats, or weight loss.  EYES: No visual changes or eye pain ENT: No hearing changes.  No history of nose bleeds.   RESPIRATORY: No cough, wheezing and shortness of breath.   CARDIOVASCULAR: Negative for chest pain, and palpitations.   GI:  Negative for abdominal discomfort, blood in stools or black stools.  No recent change in bowel habits.   GU:  No history of incontinence.  +urinary frequency MUSCLOSKELETAL: +history of joint pain or swelling. + myalgias.   SKIN: Negative for lesions, rash, and itching.   ENDOCRINE: Negative for cold or heat intolerance, polydipsia or goiter.   PSYCH:  No depression or anxiety symptoms.   NEURO: As Above.   Vital Signs:  BP 110/80   Pulse 64   Ht 6\' 4"  (1.93 m)   Wt (!) 321 lb 2 oz (145.7 kg)   SpO2 98%   BMI 39.09 kg/m    General Medical Exam:   General:  Well appearing, comfortable  Eyes/ENT: see cranial  nerve examination.   Neck: No masses appreciated.  Full range of motion without tenderness.  No carotid bruits. Respiratory:  Clear to auscultation, good air entry bilaterally.   Cardiac:  Regular rate and rhythm, no murmur.   Ext:  Mild tenderness over the lateral left hamstrings  Neurological Exam: MENTAL STATUS including orientation to time, place, person, recent and remote memory, attention span and concentration, language, and fund of knowledge is normal.  Speech is not dysarthric.  CRANIAL NERVES:  Face is symmetric.  Extraocular muscles intact.    MOTOR:  Motor strength is 5/5 in all extremities. Tone is normal.    MSRs:  Reflexes are 2+/4 throughout.  SENSORY:  Intact to pin prick, vibration, and temperature throughout.  COORDINATION/GAIT:  Gait appears stable, unassisted.   Data: Lab Results  Component Value Date   TSH 2.40 09/12/2018    Lab Results  Component Value Date   [id number] 496 04/16/2018    NCS/EMG of arms 11/16/2017:  Bilateral median neuropathy at or distal to the wrist, consistent with clinical diagnosis of carpal tunnel syndrome. Overall, these findings are moderate in degree electrically.  MRI lumbar spine 01/21/2018: 1. Moderate multifactorial spinal and bilateral lateral recess stenosis at L4-5. 2. Early spinal and lateral recess stenosis at L3-4. 3. Advanced left-sided facet disease at L4-5 and L5-S1.  IMPRESSION/PLAN: 1.  Chronic left radicular pain.  MRI lumbar spine was personally viewed with patient and he does have left sided facet degeneration with stenosis at L4-5 and L5-S1 levels which may cause nerve impingement.  He does not have spinal canal stenosis.  I have encouraged him to start physical therapy for low back stretching and strengthening. Start taking tizandine 2mg  at bedtime. Recommend trial of ESI, if there is no improvement I do not recommend surgery at this time as he has not given conservative therapies are reasonable trial  2.   Bilateral feet paresthesias.  Unlikely neuropathy given intact neurological exam, however, with his severe pain, I will check NCS/EMG of the legs.  Greater than 50% of this 40 minute visit was spent in counseling, explanation of diagnosis, planning of further management, and coordination of care due to the number of questions he had regardng his care.   Thank you for allowing me to participate in patient's care.  If I can answer any additional questions, I would be pleased to do so.    Sincerely,    Donika K. Posey Pronto, DO

## 2018-10-24 NOTE — Patient Instructions (Addendum)
NCS/EMG of the legs  Start tizanidine 2mg  at bedtime  Start physical therapy for low back pain  I would recommend that you consider injection to the back, if pain does not get better

## 2018-10-26 ENCOUNTER — Other Ambulatory Visit: Payer: Self-pay | Admitting: Orthopaedic Surgery

## 2018-10-26 DIAGNOSIS — M48061 Spinal stenosis, lumbar region without neurogenic claudication: Secondary | ICD-10-CM

## 2018-11-05 ENCOUNTER — Other Ambulatory Visit: Payer: Self-pay | Admitting: Orthopedic Surgery

## 2018-11-05 ENCOUNTER — Ambulatory Visit
Admission: RE | Admit: 2018-11-05 | Discharge: 2018-11-05 | Disposition: A | Discharge status: Home / Self Care | Payer: BLUE CROSS/BLUE SHIELD | Source: Ambulatory Visit | Attending: Orthopaedic Surgery | Admitting: Orthopaedic Surgery

## 2018-11-05 DIAGNOSIS — M542 Cervicalgia: Secondary | ICD-10-CM

## 2018-11-05 DIAGNOSIS — M48061 Spinal stenosis, lumbar region without neurogenic claudication: Secondary | ICD-10-CM

## 2018-11-05 MED ORDER — IOPAMIDOL (ISOVUE-M 200) INJECTION 41%
1.0000 mL | Freq: Once | INTRAMUSCULAR | Status: AC
  Administered 2018-11-05: 1 mL via EPIDURAL

## 2018-11-05 MED ORDER — METHYLPREDNISOLONE ACETATE 40 MG/ML INJ SUSP (RADIOLOG
120.0000 mg | Freq: Once | INTRAMUSCULAR | Status: AC
  Administered 2018-11-05: 120 mg via EPIDURAL

## 2018-11-05 NOTE — Discharge Instructions (Signed)

## 2018-11-21 ENCOUNTER — Encounter: Payer: BLUE CROSS/BLUE SHIELD | Admitting: Internal Medicine

## 2018-11-22 ENCOUNTER — Encounter: Payer: BLUE CROSS/BLUE SHIELD | Admitting: Neurology

## 2018-11-26 ENCOUNTER — Other Ambulatory Visit: Payer: BLUE CROSS/BLUE SHIELD

## 2018-11-27 ENCOUNTER — Encounter: Payer: Self-pay | Admitting: *Deleted

## 2018-12-02 ENCOUNTER — Ambulatory Visit
Admission: RE | Admit: 2018-12-02 | Discharge: 2018-12-02 | Disposition: A | Discharge status: Home / Self Care | Payer: BLUE CROSS/BLUE SHIELD | Source: Ambulatory Visit | Attending: Orthopedic Surgery | Admitting: Orthopedic Surgery

## 2018-12-02 DIAGNOSIS — M542 Cervicalgia: Secondary | ICD-10-CM

## 2018-12-03 ENCOUNTER — Encounter

## 2018-12-03 ENCOUNTER — Ambulatory Visit: Payer: BLUE CROSS/BLUE SHIELD | Admitting: Neurology

## 2018-12-06 ENCOUNTER — Ambulatory Visit (INDEPENDENT_AMBULATORY_CARE_PROVIDER_SITE_OTHER): Payer: BLUE CROSS/BLUE SHIELD | Admitting: Neurology

## 2018-12-06 DIAGNOSIS — R202 Paresthesia of skin: Secondary | ICD-10-CM | POA: Diagnosis not present

## 2018-12-06 DIAGNOSIS — G8929 Other chronic pain: Secondary | ICD-10-CM

## 2018-12-06 DIAGNOSIS — M5442 Lumbago with sciatica, left side: Principal | ICD-10-CM

## 2018-12-06 NOTE — Procedures (Signed)
[facility]  [address]  [place], [place] [address] Tel: [phone] Fax:  [phone] Test Date:  12/06/2018  Patient: [name] DOB: Sep 30, 1971 Physician: Narda Amber, DO  Sex: Male Height: 6\' 4"  Ref Phys: Narda Amber, DO  ID#: [id number] Temp: 33.0C Technician:    Patient Complaints: This is a 47 year old man referred for evaluation of left leg radicular pain and bilateral feet paresthesias.  NCV & EMG Findings: Extensive electrodiagnostic testing of the left lower extremity and additional studies of the right shows:  1. Bilateral sural and superficial peroneal sensory responses are within normal limits. 2. Bilateral peroneal and tibial motor responses are within normal limits.  Of note, there is evidence of a accessory peroneal nerve on the right, as evidenced by a motor response when stimulating at the medial malleolus. 3. Bilateral tibial H reflex studies are within normal limits. 4. Chronic motor axonal loss changes are seen affecting the L5 myotome on the left, without accompanied active denervation.  These findings are not present in the right lower extremity.   Impression: 1. Chronic L5 radiculopathy affecting the left lower extremity, mild in degree electrically.   2. Incidentally, there is an accessory peroneal nerve on the right, a normal variant. 3. There is no evidence of a large fiber sensorimotor polyneuropathy affecting the lower extremities.   ___________________________ Narda Amber, DO    Nerve Conduction Studies Anti Sensory Summary Table   Site NR Peak (ms) Norm Peak (ms) P-T Amp (V) Norm P-T Amp  Left Sup Peroneal Anti Sensory (Ant Lat Mall)  33C  12 cm    3.0 <4.5 8.2 >5  Right Sup Peroneal Anti Sensory (Ant Lat Mall)  33C  12 cm    2.5 <4.5 6.5 >5  Left Sural Anti Sensory (Lat Mall)  33C  Calf    3.1 <4.5 7.5 >5  Right Sural Anti Sensory (Lat Mall)  33C  Calf    2.9 <4.5 7.4 >5   Motor Summary Table   Site NR Onset (ms) Norm Onset (ms) O-P Amp (mV) Norm O-P Amp Site1 Site2 Delta-0 (ms) Dist (cm) Vel (m/s) Norm Vel (m/s)  Left Peroneal Motor (Ext Dig Brev)  33C  Ankle    3.8 <5.5 8.1 >3 B Fib Ankle 8.7 42.0 48 >40  B Fib    12.5  7.4  Poplt B Fib 1.6 10.0 63 >40  Poplt    14.1  7.2         Right Peroneal Motor (Ext Dig Brev)  33C  Ankle    4.3 <5.5 3.4 >3 B Fib Ankle 10.2 41.0 40 >40  B Fib    14.5  2.9  Poplt B Fib 1.4 9.0 64 >40  Poplt    15.9  2.7         Medial malleolus    4.2  2.6         Left Tibial Motor (Abd Hall Brev)  33C  Ankle    3.5 <6.0 12.0 >8 Knee Ankle 9.8 43.0 44 >40  Knee    13.3  8.6         Right Tibial Motor (Abd Hall Brev)  33C  Ankle    3.7 <6.0 9.7 >8 Knee Ankle 11.0 45.0 41 >40  Knee    14.7  7.1          H Reflex Studies   NR H-Lat (ms) Lat Norm (ms) L-R H-Lat (ms)  Left Tibial (Gastroc)  33C  34.97 <35 0.28  Right Tibial (Gastroc)  33C     34.69 <35 0.28   EMG   Side Muscle Ins Act Fibs Psw Fasc Number Recrt Dur Dur. Amp Amp. Poly Poly. Comment  Left AntTibialis Nml Nml Nml Nml 1- Rapid Some 1+ Some 1+ Nml Nml N/A  Left Gastroc Nml Nml Nml Nml Nml Nml Nml Nml Nml Nml Nml Nml N/A  Left Flex Dig Long Nml Nml Nml Nml 1- Rapid Some 1+ Some 1+ Nml Nml N/A  Left GluteusMed Nml Nml Nml Nml 1- Rapid Some 1+ Some 1+ Nml Nml N/A  Left RectFemoris Nml Nml Nml Nml Nml Nml Nml Nml Nml Nml Nml Nml N/A  Left BicepsFemS Nml Nml Nml Nml Nml Nml Nml Nml Nml Nml Nml Nml N/A  Right AntTibialis Nml Nml Nml Nml Nml Nml Nml Nml Nml Nml Nml Nml N/A  Right Gastroc Nml Nml Nml Nml Nml Nml Nml Nml Nml Nml Nml Nml N/A  Right Flex Dig Long Nml Nml Nml Nml Nml Nml Nml Nml Nml Nml Nml Nml N/A  Right RectFemoris Nml Nml Nml Nml Nml Nml Nml Nml Nml Nml Nml Nml N/A  Right GluteusMed Nml Nml Nml Nml Nml Nml Nml Nml Nml Nml Nml Nml N/A  Right BicepsFemS Nml Nml Nml Nml Nml Nml Nml Nml Nml Nml Nml Nml N/A      Waveforms:

## 2018-12-13 ENCOUNTER — Encounter

## 2018-12-13 ENCOUNTER — Encounter: Payer: BLUE CROSS/BLUE SHIELD | Admitting: Neurology

## 2018-12-24 ENCOUNTER — Ambulatory Visit: Payer: BLUE CROSS/BLUE SHIELD | Admitting: Family Medicine

## 2018-12-24 DIAGNOSIS — Z0289 Encounter for other administrative examinations: Secondary | ICD-10-CM

## 2018-12-25 ENCOUNTER — Ambulatory Visit: Payer: BLUE CROSS/BLUE SHIELD | Admitting: Family Medicine

## 2018-12-25 ENCOUNTER — Telehealth: Payer: Self-pay | Admitting: Physician Assistant

## 2018-12-25 NOTE — Telephone Encounter (Signed)
Pt wanting to add some family history to his new pt records.  Please call pt bakc to discuss today.  Thanks, [employer]

## 2018-12-26 NOTE — Telephone Encounter (Signed)
Patient wants this to be updated in family history. Paternal grandfather lung cancer Paternal uncle lung cancer  Father pulmonary fibrosis.

## 2018-12-27 ENCOUNTER — Other Ambulatory Visit: Payer: Self-pay

## 2018-12-27 ENCOUNTER — Encounter: Payer: Self-pay | Admitting: Physician Assistant

## 2018-12-27 ENCOUNTER — Ambulatory Visit (INDEPENDENT_AMBULATORY_CARE_PROVIDER_SITE_OTHER): Payer: BLUE CROSS/BLUE SHIELD | Admitting: Physician Assistant

## 2018-12-27 VITALS — BP 117/82 | HR 82 | Temp 97.8°F | Resp 16 | Ht 74.0 in | Wt 311.8 lb

## 2018-12-27 DIAGNOSIS — J40 Bronchitis, not specified as acute or chronic: Secondary | ICD-10-CM | POA: Diagnosis not present

## 2018-12-27 DIAGNOSIS — R7303 Prediabetes: Secondary | ICD-10-CM | POA: Diagnosis not present

## 2018-12-27 DIAGNOSIS — J069 Acute upper respiratory infection, unspecified: Secondary | ICD-10-CM | POA: Diagnosis not present

## 2018-12-27 MED ORDER — ALBUTEROL SULFATE HFA 108 (90 BASE) MCG/ACT IN AERS: 2.0000 | INHALATION_SPRAY | Freq: Four times a day (QID) | RESPIRATORY_TRACT | 2 refills | Status: DC | PRN

## 2018-12-27 MED ORDER — PREDNISONE 5 MG (21) PO TBPK: ORAL_TABLET | ORAL | 0 refills | Status: DC

## 2018-12-27 NOTE — Progress Notes (Signed)
Patient: [name], Male    DOB: 14-Jul-1971, 48 y.o.   MRN: [id number] Visit Date: 01/10/2019  Today's Provider: Trinna Post, PA-C   Chief Complaint  Patient presents with  . New Patient (Initial Visit)  . URI   Subjective:    Annual physical exam [name] is a 48 y.o. male who presents today to establish care. Patient is transferring from [facility].   Patient is c/o cough, runny nose ans congestion x's 3 weeks.   Previously [facility], now has mostly [place] [facility] and had to change PCP. Living in [place] with wife married for 13 years. Children: 8 total. Works as [employer] [place]..[place]. Came here in 1996  Morbid Obesity: Has consulted with bariatric surgery in [facility]. Was losing away, dad passed away last month and he gained weight back. He stopped drinking soda 1 year ago.   Wt Readings from Last 3 Encounters:  01/02/19 (!) 311 lb (141.1 kg)  12/27/18 (!) 311 lb 12.8 oz (141.4 kg)  10/24/18 (!) 321 lb 2 oz (145.7 kg)   Obstructive Sleep Apnea: Seen by Dr. Mortimer Fries in pulmonology. Uses CPAP every night, reduces daytime fatigue.   Followed by cardiology per his preference for RBB.  Prediabetes: previously prediabetic. A1c today normal.  Lab Results  Component Value Date   HGBA1C 5.4 09/12/2018    -----------------------------------------------------------------   Review of Systems  Constitutional: Negative.   HENT: Positive for ear pain, postnasal drip, rhinorrhea, sinus pressure, sinus pain and sore throat.   Eyes: Negative.   Respiratory: Positive for cough, chest tightness and wheezing.   Cardiovascular: Negative.   Gastrointestinal: Negative.   Endocrine: Negative.   Genitourinary: Negative.   Musculoskeletal: Positive for gait problem.  Skin: Negative.   Allergic/Immunologic: Negative.   Neurological: Positive for weakness and headaches.    Hematological: Negative.   Psychiatric/Behavioral: Negative.     Social History      He  reports that he has never smoked. He has never used smokeless tobacco. He reports previous alcohol use. He reports that he does not use drugs.       Social History   Socioeconomic History  . Marital status: Married    Spouse name: Not on file  . Number of children: 8  . Years of education: 23  . Highest education level: Not on file  Occupational History  . 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 Financial resource strain: Not on file  . Food insecurity:    Worry: Not on file    Inability: Not on file  . Transportation needs:    Medical: Not on file    Non-medical: Not on file  Tobacco Use  . Smoking status: Never Smoker  . Smokeless tobacco: Never Used  Substance and Sexual Activity  . Alcohol use: Not Currently    Alcohol/week: 0.0 standard drinks    Frequency: Never    Comment: occasional beer/wine  . Drug use: No  . Sexual activity: Yes  Lifestyle  . Physical activity:    Days per week: Not on file    Minutes per session: Not on file  . Stress: Not on file  Relationships  . Social connections:    Talks on phone: Not on file    Gets together: Not on file    Attends religious service: Not on  file    Active member of club or organization: Not on file    Attends meetings of clubs or organizations: Not on file    Relationship status: Not on file  Other Topics Concern  . 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    Past Medical History:  Diagnosis Date  . Carpal tunnel syndrome    bileteral - "Minor"  . Complication of anesthesia 1996   ran a fever after sinus surgery.  In [place].  . Family history of adverse reaction to anesthesia    MOM-UNSURE WHAT HAPPENED WITH HIS MOM  . Heart murmur   . Hyperlipidemia   .  Mitral valve prolapse   . Personal history of colonic polyps = adenoma 01/24/2012   5 mm adenoma removed 01/2012 Carlean Purl   . Rectal bleeding    polyp removed 01/19/2012 pre canerous  . Right bundle branch block   . Sleep apnea    USES CPAP     Patient Active Problem List   Diagnosis Date Noted  . Chronic knee pain (Primary Area of Pain) (Left) 04/16/2018  . Chronic lower extremity pain (Secondary Area of Pain) (Left) 04/16/2018  . 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 Chronic pain syndrome 04/16/2018  . 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 Morbid obesity ([place]) 10/10/2016  . Anxiety 10/10/2016  . Hyperlipidemia   . Right upper quadrant abdominal pain 07/12/2013  . Routine general medical examination at a health care facility 01/04/2013  . Obesity 01/04/2013  . Personal history of colonic polyps = adenoma 01/24/2012    Past Surgical History:  Procedure Laterality Date  . ACHILLES TENDON SURGERY Right 06/12/2018   Procedure: RIGHT ACHILLES TENDON REPAIR AND EXCISION OF HEEL SPUR;  Surgeon: Melrose Nakayama, MD;  Location: [facility];  Service: Orthopedics;  Laterality: Right;  . CHONDROPLASTY  02/27/2012   Procedure: CHONDROPLASTY;  Surgeon: Alta Corning, MD;  Location: [facility];  Service: Orthopedics;  Laterality: Right;  patella-femoral joint  . CIRCUMCISION N/A 07/10/2018   Procedure: CIRCUMCISION ADULT;  Surgeon: Abbie Sons, MD;  Location: [facility];  Service: Urology;  Laterality: N/A;  MAC with local  . COLONOSCOPY    . EYE SURGERY    . HEEL SPUR SURGERY Bilateral 2016  . KNEE ARTHROSCOPY Left 3/16  . KNEE ARTHROSCOPY Left 03/10/2017   Procedure: ARTHROSCOPY KNEE;  Surgeon: Dorna Leitz, MD;  Location: [facility];  Service: Orthopedics;  Laterality: Left;  . LASIK  2006   bilateral  . NASAL TURBINATE REDUCTION  1996  . VASECTOMY      Family History        Family Status  Relation Name Status  . Mother  Deceased    . Father  Alive       healthy  . Sister  Alive       healthy  . Other  (Not Specified)  . Son  Alive  . Daughter  Alive  . PGF  (Not Specified)  . Neg Hx  (Not Specified)        His family history includes Anesthesia problems in his mother; Arthritis in his son; Asthma in an other family member; Colitis in his father; Hypertension in his mother; Lung cancer in his father and paternal grandfather; Multiple sclerosis in his mother; Pulmonary fibrosis  in his father. There is no history of Colon cancer.      No Known Allergies   Current Outpatient Medications:  .  albuterol (PROVENTIL HFA;VENTOLIN HFA) 108 (90 Base) MCG/ACT inhaler, Inhale 2 puffs into the lungs every 6 (six) hours as needed for wheezing or shortness of breath., Disp: 1 Inhaler, Rfl: 2 .  beclomethasone (QVAR REDIHALER) 40 MCG/ACT inhaler, Inhale 1 puff into the lungs 2 (two) times daily., Disp: 10.6 g, Rfl: 0 .  budesonide (PULMICORT FLEXHALER) 180 MCG/ACT inhaler, Inhale 1 puff into the lungs 2 (two) times daily., Disp: 1 Inhaler, Rfl: 0 .  ipratropium-albuterol (DUONEB) 0.5-2.5 (3) MG/3ML SOLN, Take 3 mLs by nebulization every 6 (six) hours as needed., Disp: 360 mL, Rfl: 0  Current Facility-Administered Medications:  .  ipratropium-albuterol (DUONEB) 0.5-2.5 (3) MG/3ML nebulizer solution 3 mL, 3 mL, Nebulization, Once, Trinna Post, PA-C   Patient Care Team: Paulene Floor as PCP - General (Physician Assistant) Minna Merritts, MD as Consulting Physician (Cardiology)      Objective:   Vitals: BP 117/82 (BP Location: Left Arm, Patient Position: Sitting, Cuff Size: Large)   Pulse 82   Temp 97.8 F (36.6 C) (Oral)   Resp 16   Ht 6' 2"  (1.88 m)   Wt (!) 311 lb 12.8 oz (141.4 kg)   SpO2 97%   BMI 40.03 kg/m    Vitals:   12/27/18 0919  BP: 117/82  Pulse: 82  Resp: 16  Temp: 97.8 F (36.6 C)  TempSrc: Oral  SpO2: 97%  Weight: (!) 311 lb 12.8 oz (141.4 kg)  Height: 6' 2"  (1.88 m)      Physical Exam Constitutional:      Appearance: Normal appearance.  HENT:     Right Ear: Tympanic membrane and ear canal normal.     Left Ear: Tympanic membrane and ear canal normal.     Mouth/Throat:     Mouth: Mucous membranes are moist.     Pharynx: Oropharynx is clear.  Cardiovascular:     Rate and Rhythm: Normal rate and regular rhythm.     Heart sounds: Normal heart sounds.  Pulmonary:     Effort: Pulmonary effort is normal.     Breath sounds: Normal breath sounds.  Skin:    General: Skin is warm and dry.  Neurological:     Mental Status: He is alert and oriented to person, place, and time. Mental status is at baseline.  Psychiatric:        Mood and Affect: Mood normal.        Behavior: Behavior normal.      Depression Screen PHQ 2/9 Scores 12/27/2018 08/21/2018 04/30/2018 04/16/2018  PHQ - 2 Score 3 0 0 2  PHQ- 9 Score 10 - - 2      Assessment & Plan:     Routine Health Maintenance and Physical Exam  Exercise Activities and Dietary recommendations Goals   None     Immunization History  Administered Date(s) Administered  . Influenza Whole 09/28/2009, 10/13/2010, 09/28/2013  . Influenza, Seasonal, Injecte, Preservative Fre 10/05/2016  . Influenza-Unspecified 10/12/2017  . Td 12/12/2005  . Tdap 04/21/2015    Health Maintenance  Topic Date Due  . INFLUENZA VACCINE  07/12/2018  . COLONOSCOPY  06/26/2022  . TETANUS/TDAP  04/20/2025  . HIV Screening  Completed     Discussed health benefits of physical activity, and encouraged him to engage in regular exercise appropriate for his age and condition.  1. Prediabetes  - POCT HgB A1C  2. URI with cough and congestion   3. Bronchitis  - albuterol (PROVENTIL HFA;VENTOLIN HFA) 108 (90 Base) MCG/ACT inhaler; Inhale 2 puffs into the lungs every 6 (six) hours as needed for wheezing or shortness of breath.  Dispense: 1 Inhaler; Refill: 2  4. Morbid obesity ([place])  Counseling provided.  Return in  about 1 year (around 12/28/2019).  The entirety of the information documented in the History of Present Illness, Review of Systems and Physical Exam were personally obtained by me. Portions of this information were initially documented by Lynford Humphrey, CMA and reviewed by me for thoroughness and accuracy.    --------------------------------------------------------------------    Trinna Post, PA-C  [facility]

## 2018-12-27 NOTE — Patient Instructions (Signed)
Can take daily allegra, claritin, xyrtec, or xyzal for post nasal drip.

## 2018-12-29 ENCOUNTER — Encounter: Payer: Self-pay | Admitting: Physician Assistant

## 2018-12-31 ENCOUNTER — Other Ambulatory Visit: Payer: Self-pay | Admitting: Physician Assistant

## 2018-12-31 DIAGNOSIS — J0110 Acute frontal sinusitis, unspecified: Secondary | ICD-10-CM

## 2018-12-31 MED ORDER — AMOXICILLIN-POT CLAVULANATE 875-125 MG PO TABS: 1.0000 | ORAL_TABLET | Freq: Two times a day (BID) | ORAL | 0 refills | Status: AC

## 2019-01-01 ENCOUNTER — Encounter: Payer: Self-pay | Admitting: Urology

## 2019-01-01 ENCOUNTER — Encounter: Payer: Self-pay | Admitting: Physician Assistant

## 2019-01-02 ENCOUNTER — Encounter: Payer: Self-pay | Admitting: Physician Assistant

## 2019-01-02 ENCOUNTER — Ambulatory Visit (INDEPENDENT_AMBULATORY_CARE_PROVIDER_SITE_OTHER): Payer: BLUE CROSS/BLUE SHIELD | Admitting: Physician Assistant

## 2019-01-02 VITALS — BP 117/75 | HR 69 | Temp 98.1°F | Resp 16 | Wt 311.0 lb

## 2019-01-02 DIAGNOSIS — R05 Cough: Secondary | ICD-10-CM

## 2019-01-02 DIAGNOSIS — R059 Cough, unspecified: Secondary | ICD-10-CM

## 2019-01-02 DIAGNOSIS — J40 Bronchitis, not specified as acute or chronic: Secondary | ICD-10-CM | POA: Diagnosis not present

## 2019-01-02 DIAGNOSIS — G4730 Sleep apnea, unspecified: Secondary | ICD-10-CM

## 2019-01-02 DIAGNOSIS — K635 Polyp of colon: Secondary | ICD-10-CM

## 2019-01-02 DIAGNOSIS — L989 Disorder of the skin and subcutaneous tissue, unspecified: Secondary | ICD-10-CM

## 2019-01-02 DIAGNOSIS — I4510 Unspecified right bundle-branch block: Secondary | ICD-10-CM | POA: Diagnosis not present

## 2019-01-02 MED ORDER — BUDESONIDE 180 MCG/ACT IN AEPB: 1.0000 | INHALATION_SPRAY | Freq: Two times a day (BID) | RESPIRATORY_TRACT | 0 refills | Status: DC

## 2019-01-02 MED ORDER — IPRATROPIUM-ALBUTEROL 0.5-2.5 (3) MG/3ML IN SOLN: 3.0000 mL | Freq: Once | RESPIRATORY_TRACT | Status: DC

## 2019-01-02 MED ORDER — IPRATROPIUM-ALBUTEROL 0.5-2.5 (3) MG/3ML IN SOLN: 3.0000 mL | Freq: Four times a day (QID) | RESPIRATORY_TRACT | 0 refills | Status: DC | PRN

## 2019-01-02 NOTE — Telephone Encounter (Signed)
Anderson Malta,  Patient's that are self pay receive a 55% discount.

## 2019-01-02 NOTE — Telephone Encounter (Signed)
Similar message forwarded to billing.

## 2019-01-02 NOTE — Patient Instructions (Signed)
Acute Bronchitis, Adult Acute bronchitis is when air tubes (bronchi) in the lungs suddenly get swollen. The condition can make it hard to breathe. It can also cause these symptoms:  A cough.  Coughing up clear, yellow, or green mucus.  Wheezing.  Chest congestion.  Shortness of breath.  A fever.  Body aches.  Chills.  A sore throat. Follow these instructions at home:  Medicines  Take over-the-counter and prescription medicines only as told by your doctor.  If you were prescribed an antibiotic medicine, take it as told by your doctor. Do not stop taking the antibiotic even if you start to feel better. General instructions  Rest.  Drink enough fluids to keep your pee (urine) pale yellow.  Avoid smoking and secondhand smoke. If you smoke and you need help quitting, ask your doctor. Quitting will help your lungs heal faster.  Use an inhaler, cool mist vaporizer, or humidifier as told by your doctor.  Keep all follow-up visits as told by your doctor. This is important. How is this prevented? To lower your risk of getting this condition again:  Wash your hands often with soap and water. If you cannot use soap and water, use hand sanitizer.  Avoid contact with people who have cold symptoms.  Try not to touch your hands to your mouth, nose, or eyes.  Make sure to get the flu shot every year. Contact a doctor if:  Your symptoms do not get better in 2 weeks. Get help right away if:  You cough up blood.  You have chest pain.  You have very bad shortness of breath.  You become dehydrated.  You faint (pass out) or keep feeling like you are going to pass out.  You keep throwing up (vomiting).  You have a very bad headache.  Your fever or chills gets worse. This information is not intended to replace advice given to you by your health care provider. Make sure you discuss any questions you have with your health care provider. Document Released: 05/16/2008 Document  Revised: 07/12/2017 Document Reviewed: 05/18/2016 Elsevier Interactive Patient Education  2019 Elsevier Inc.  

## 2019-01-02 NOTE — Progress Notes (Signed)
Patient: [name] Male    DOB: Mar 21, 1971   48 y.o.   MRN: [id number] Visit Date: 01/02/2019  Today's Provider: Trinna Post, PA-C   Chief Complaint  Patient presents with  . URI   Subjective:     HPI Upper Respiratory Infection: Patient complains of symptoms of a URI, possible sinusitis. Symptoms include congestion, cough and body ache. Onset of symptoms was a few days ago, gradually worsening since that time. He also c/o congestion and productive cough with  green colored sputum for the past 1 day .  He is drinking plenty of fluids. Evaluation to date:seen previously and thought to have a viral URI. Treatment to date: antibiotics. Patient was seen on 12/27/2018. He has his wife on the phone during the office visit today who suggests he be treated with a duoneb and receive an inhaled steroid because the oral steroids did not help enough. Reports his child has a nebulizer machine at home.  Patient is also requesting referrals to [facility] for specialty clinics for the following issues: Cardiology to monitor RBB, gastroenterology for polyps, pulmonology for sleep apnea.      No Known Allergies   Current Outpatient Medications:  .  albuterol (PROVENTIL HFA;VENTOLIN HFA) 108 (90 Base) MCG/ACT inhaler, Inhale 2 puffs into the lungs every 6 (six) hours as needed for wheezing or shortness of breath., Disp: 1 Inhaler, Rfl: 2 .  amoxicillin-clavulanate (AUGMENTIN) 875-125 MG tablet, Take 1 tablet by mouth 2 (two) times daily for 7 days., Disp: 14 tablet, Rfl: 0  Review of Systems  Constitutional: Positive for chills and fatigue.  HENT: Positive for congestion, sinus pressure and sinus pain.   Respiratory: Positive for cough, shortness of breath and wheezing.     Social History   Tobacco Use  . Smoking status: Never Smoker  . Smokeless tobacco: Never Used  Substance Use Topics  . Alcohol use: Not Currently    Alcohol/week: 0.0 standard drinks    Frequency: Never   Comment: occasional beer/wine      Objective:   BP 117/75 (BP Location: Left Arm, Patient Position: Sitting, Cuff Size: Large)   Pulse 69   Temp 98.1 F (36.7 C) (Oral)   Resp 16   Wt (!) 311 lb (141.1 kg)   SpO2 95%   BMI 39.93 kg/m  Vitals:   01/02/19 0954  BP: 117/75  Pulse: 69  Resp: 16  Temp: 98.1 F (36.7 C)  TempSrc: Oral  SpO2: 95%  Weight: (!) 311 lb (141.1 kg)     Physical Exam Constitutional:      Appearance: Normal appearance.  Cardiovascular:     Rate and Rhythm: Normal rate and regular rhythm.     Heart sounds: Normal heart sounds.  Pulmonary:     Effort: Pulmonary effort is normal.     Breath sounds: Wheezing and rhonchi present.     Comments: Poor air entry bilaterally. After duoneb administration, airflow is better. Wheezes and rhonchi are more apparent. Skin:    General: Skin is warm and dry.  Neurological:     Mental Status: He is alert and oriented to person, place, and time. Mental status is at baseline.  Psychiatric:        Mood and Affect: Mood normal.        Behavior: Behavior normal.         Assessment & Plan    1. Bronchitis  Duoneb ordered for patient, he may use tubing and mask  from this visit. Inhaled steroid ordered at request of patient's wife.   - ipratropium-albuterol (DUONEB) 0.5-2.5 (3) MG/3ML nebulizer solution 3 mL - DG Chest 2 View; Future - ipratropium-albuterol (DUONEB) 0.5-2.5 (3) MG/3ML SOLN; Take 3 mLs by nebulization every 6 (six) hours as needed.  Dispense: 360 mL; Refill: 0 - budesonide (PULMICORT FLEXHALER) 180 MCG/ACT inhaler; Inhale 1 puff into the lungs 2 (two) times daily.  Dispense: 1 Inhaler; Refill: 0  2. Cough  CXR is normal. I have personally reviewed images and agree with findings.  - DG Chest 2 View; Future - ipratropium-albuterol (DUONEB) 0.5-2.5 (3) MG/3ML SOLN; Take 3 mLs by nebulization every 6 (six) hours as needed.  Dispense: 360 mL; Refill: 0  3. Right bundle branch block  - Ambulatory  referral to Cardiology  4. Sleep apnea, unspecified type  - Ambulatory referral to Pulmonology  5. Polyp of colon, unspecified part of colon, unspecified type  - Ambulatory referral to Gastroenterology  6. Skin lesion  - Ambulatory referral to Dermatology  The entirety of the information documented in the History of Present Illness, Review of Systems and Physical Exam were personally obtained by me. Portions of this information were initially documented by Lynford Humphrey, CMA and reviewed by me for thoroughness and accuracy.       Trinna Post, PA-C  [facility]

## 2019-01-03 ENCOUNTER — Encounter: Payer: BLUE CROSS/BLUE SHIELD | Admitting: Neurology

## 2019-01-03 ENCOUNTER — Telehealth: Payer: Self-pay | Admitting: Physician Assistant

## 2019-01-03 NOTE — Telephone Encounter (Signed)
Pt returned missed call, Please call him back on cell phone.  Thanks, [employer]

## 2019-01-03 NOTE — Telephone Encounter (Signed)
Patient advised as below.  

## 2019-01-03 NOTE — Telephone Encounter (Signed)
lmtcb

## 2019-01-03 NOTE — Telephone Encounter (Signed)
I have reviewed CXR from [facility] yesterday and it was clear, no pneumonia.

## 2019-01-04 ENCOUNTER — Encounter: Payer: Self-pay | Admitting: Urology

## 2019-01-05 ENCOUNTER — Encounter: Payer: Self-pay | Admitting: Physician Assistant

## 2019-01-05 DIAGNOSIS — J4540 Moderate persistent asthma, uncomplicated: Secondary | ICD-10-CM

## 2019-01-07 ENCOUNTER — Other Ambulatory Visit: Payer: Self-pay

## 2019-01-07 DIAGNOSIS — Z7689 Persons encountering health services in other specified circumstances: Secondary | ICD-10-CM

## 2019-01-07 MED ORDER — BECLOMETHASONE DIPROP HFA 40 MCG/ACT IN AERB: 1.0000 | INHALATION_SPRAY | Freq: Two times a day (BID) | RESPIRATORY_TRACT | 0 refills | Status: DC

## 2019-01-08 ENCOUNTER — Encounter: Payer: Self-pay | Admitting: Physician Assistant

## 2019-01-08 ENCOUNTER — Other Ambulatory Visit: Payer: Self-pay | Admitting: Internal Medicine

## 2019-01-08 DIAGNOSIS — G4733 Obstructive sleep apnea (adult) (pediatric): Secondary | ICD-10-CM

## 2019-01-08 NOTE — Progress Notes (Signed)
Per patient request in-network pulmonary provider referral placed.

## 2019-01-10 ENCOUNTER — Encounter: Payer: Self-pay | Admitting: Physician Assistant

## 2019-01-10 LAB — POCT GLYCOSYLATED HEMOGLOBIN (HGB A1C): Hemoglobin A1C: 5.4 % (ref 4.0–5.6)

## 2019-01-14 ENCOUNTER — Telehealth: Payer: Self-pay | Admitting: Physician Assistant

## 2019-01-14 NOTE — Telephone Encounter (Signed)
I tried to schedule an appointment for patient to be seen at [facility] pulmonology department. I was told patient will need a referral to neurology instead

## 2019-01-15 NOTE — Telephone Encounter (Signed)
Did he say what the referral is for?

## 2019-01-16 NOTE — Telephone Encounter (Signed)
It is for his sleep apnea but when I called back a second time I was told he could be seen in pulmonology as long as he has had a sleep study. The 1st person I spoke to did not give me enough information and just said that diagnosis must be sent to neurology.Don't worry about putting the new order in

## 2019-01-16 NOTE — Telephone Encounter (Signed)
FYI

## 2019-01-29 ENCOUNTER — Other Ambulatory Visit: Payer: Self-pay | Admitting: Orthopaedic Surgery

## 2019-01-29 DIAGNOSIS — M48061 Spinal stenosis, lumbar region without neurogenic claudication: Secondary | ICD-10-CM

## 2019-02-04 ENCOUNTER — Other Ambulatory Visit: Payer: BLUE CROSS/BLUE SHIELD

## 2019-02-08 ENCOUNTER — Ambulatory Visit
Admission: RE | Admit: 2019-02-08 | Discharge: 2019-02-08 | Disposition: A | Discharge status: Home / Self Care | Payer: BLUE CROSS/BLUE SHIELD | Source: Ambulatory Visit | Attending: Orthopaedic Surgery | Admitting: Orthopaedic Surgery

## 2019-02-08 ENCOUNTER — Other Ambulatory Visit: Payer: Self-pay | Admitting: Orthopaedic Surgery

## 2019-02-08 DIAGNOSIS — M48062 Spinal stenosis, lumbar region with neurogenic claudication: Secondary | ICD-10-CM

## 2019-02-08 DIAGNOSIS — M48061 Spinal stenosis, lumbar region without neurogenic claudication: Secondary | ICD-10-CM

## 2019-02-08 MED ORDER — IOPAMIDOL (ISOVUE-M 200) INJECTION 41%
1.0000 mL | Freq: Once | INTRAMUSCULAR | Status: AC
  Administered 2019-02-08: 1 mL via EPIDURAL

## 2019-02-08 MED ORDER — METHYLPREDNISOLONE ACETATE 40 MG/ML INJ SUSP (RADIOLOG
120.0000 mg | Freq: Once | INTRAMUSCULAR | Status: AC
  Administered 2019-02-08: 120 mg via EPIDURAL

## 2019-02-08 NOTE — Discharge Instructions (Signed)

## 2019-03-04 ENCOUNTER — Other Ambulatory Visit: Payer: BLUE CROSS/BLUE SHIELD

## 2019-04-03 ENCOUNTER — Telehealth: Payer: Self-pay | Admitting: Physician Assistant

## 2019-04-03 NOTE — Telephone Encounter (Signed)
Best number [phone]   Pt transfer to [facility] and he wants know if he can transfer his care back to you. He is having rectal pain   Pt has medicad.  In office visit or virtual

## 2019-04-03 NOTE — Telephone Encounter (Signed)
I will see him even if we can't bill If he really thinks I need to see what is going on, you can schedule in office

## 2019-04-04 NOTE — Telephone Encounter (Signed)
Left message asking pt to call office  °

## 2019-04-08 ENCOUNTER — Other Ambulatory Visit: Payer: BLUE CROSS/BLUE SHIELD

## 2019-04-08 NOTE — Telephone Encounter (Signed)
Spoke with pt he stated he was at dr appointment and would call back to schedule

## 2019-04-11 ENCOUNTER — Telehealth: Payer: Self-pay | Admitting: General Surgery

## 2019-04-11 NOTE — Telephone Encounter (Signed)
Left a voicemail for the patient to contact the office to pre-screen for virtual visit with Dr Carlean Purl 04/15/2019 @130 . Patient told to ask for Olivia Mackie or PJ

## 2019-04-15 ENCOUNTER — Encounter: Payer: Self-pay | Admitting: Internal Medicine

## 2019-04-15 ENCOUNTER — Other Ambulatory Visit: Payer: Self-pay

## 2019-04-15 ENCOUNTER — Ambulatory Visit (INDEPENDENT_AMBULATORY_CARE_PROVIDER_SITE_OTHER): Payer: BLUE CROSS/BLUE SHIELD | Admitting: Internal Medicine

## 2019-04-15 VITALS — Ht 76.0 in

## 2019-04-15 DIAGNOSIS — K648 Other hemorrhoids: Secondary | ICD-10-CM

## 2019-04-15 DIAGNOSIS — K581 Irritable bowel syndrome with constipation: Secondary | ICD-10-CM | POA: Diagnosis not present

## 2019-04-15 DIAGNOSIS — M5417 Radiculopathy, lumbosacral region: Secondary | ICD-10-CM

## 2019-04-15 DIAGNOSIS — K6289 Other specified diseases of anus and rectum: Secondary | ICD-10-CM | POA: Diagnosis not present

## 2019-04-15 DIAGNOSIS — K625 Hemorrhage of anus and rectum: Secondary | ICD-10-CM

## 2019-04-15 MED ORDER — HYDROCORTISONE (PERIANAL) 2.5 % EX CREA: TOPICAL_CREAM | Freq: Two times a day (BID) | CUTANEOUS | 0 refills | Status: DC | PRN

## 2019-04-15 NOTE — Progress Notes (Signed)
TELEHEALTH ENCOUNTER IN SETTING OF COVID-19 PANDEMIC - REQUESTED BY PATIENT SERVICE PROVIDED BY TELEMEDECINE - TYPE: Zoom PATIENT LOCATION: Home PATIENT HAS CONSENTED TO TELEHEALTH VISIT PROVIDER LOCATION: OFFICE REFERRING PROVIDER:Self PARTICIPANTS OTHER THAN PATIENT:Noone TIME SPENT ON CALL:20 mins    Xzavior Reinig 48 y.o. 04/10/1971 [id number]  Assessment & Plan:   Encounter Diagnoses  Name Primary?   Rectal pain Yes   Rectal bleeding    Irritable bowel syndrome with constipation    Bleeding internal hemorrhoids    Lumbosacral radiculopathy at L5    I think he most likely had a hard stool with painful defecation with an acute tear versus transient fissure and bleeding from that and his known hemorrhoids.  I have asked him to use Benefiber 1 or 2 tablespoons daily.  As needed 2.5% hydrocortisone cream as well.  If his problems do not resolve he is to call me back.  He is wondering if his lumbosacral radiculopathy and stenosis issues have something to do with this and probably not.  He is planning to have surgery for that later this month.  If he was having neuropathic bowel issues I would expect some sort of bladder issues as well.  Again, he will see how things go if he fails to resolve let me know.  He had a colonoscopy in 2018 so I do not think that needs to be repeated.   I appreciate the opportunity to care for this patient. CC: Trinna Post, PA-C   Subjective:   Chief Complaint: Rectal bleeding and pain  HPI Germain Osgood is having a telehealth visit today because of rectal bleeding and pain.  A few weeks ago he had a very hard bowel movement that was painful and had some sharp searing pain in the anal area and rectal area after that for about a day or so and now it is more of a discomfort.  He has had some bright red blood on the toilet paper and into the bowl.  Things are improved but he just wanted to check in with me.  He says he was diagnosed with  lumbar spinal stenosis.  He wonders if that is related.  He has a little bit of urinary frequency but no problems controlling his bladder.  He does not have fecal incontinence.  What he has is a left-sided L5 and S1 radiculopathy and he is going to have surgery for that later this month.  He does drink 2 to 3 L of water a day.  If he is working and perspiring a lot he will add some Gatorade.  He does move his bowels better and easier when he eats vegetables but he says he does not eat significant amounts of those.  He has tried Metamucil in the past but is not taking any fiber supplements right now.  He reports businesses good though it is all carry out right now with the coronavirus restrictions. Wt Readings from Last 3 Encounters:  01/02/19 (!) 311 lb (141.1 kg)  12/27/18 (!) 311 lb 12.8 oz (141.4 kg)  10/24/18 (!) 321 lb 2 oz (145.7 kg)  He has had bleeding hemorrhoids off and on for a number of years and irritable bowel-like problem also.  Colonoscopy 2013 he had a diminutive adenoma and in 2018 he had 2 diminutive adenomas.  Otherwise negative exams with good preps.  No Known Allergies Current Meds  Medication Sig   acetaminophen (TYLENOL 8 HOUR ARTHRITIS PAIN) 650 MG CR tablet Take 650 mg by  mouth every 8 (eight) hours as needed for pain.   ipratropium-albuterol (DUONEB) 0.5-2.5 (3) MG/3ML SOLN Take 3 mLs by nebulization every 6 (six) hours as needed.   Current Facility-Administered Medications for the 04/15/19 encounter (Office Visit) with [name], MD  Medication   ipratropium-albuterol (DUONEB) 0.5-2.5 (3) MG/3ML nebulizer solution 3 mL   Past Medical History:  Diagnosis Date   Carpal tunnel syndrome    bileteral - "Minor"   Complication of anesthesia 1996   ran a fever after sinus surgery.  In [place].   Family history of adverse reaction to anesthesia    MOM-UNSURE WHAT HAPPENED WITH HIS MOM   Heart murmur    Hyperlipidemia    Mitral valve prolapse    Personal  history of colonic polyps = adenoma 01/24/2012   5 mm adenoma removed 01/2012 - [facility]    Rectal bleeding    polyp removed 01/19/2012 pre canerous   Right bundle branch block    Sleep apnea    USES CPAP   Past Surgical History:  Procedure Laterality Date   ACHILLES TENDON SURGERY Right 06/12/2018   Procedure: RIGHT ACHILLES TENDON REPAIR AND EXCISION OF HEEL SPUR;  Surgeon: Melrose Nakayama, MD;  Location: [place];  Service: Orthopedics;  Laterality: Right;   CHONDROPLASTY  02/27/2012   Procedure: CHONDROPLASTY;  Surgeon: Alta Corning, MD;  Location: [place];  Service: Orthopedics;  Laterality: Right;  patella-femoral joint   CIRCUMCISION N/A 07/10/2018   Procedure: CIRCUMCISION ADULT;  Surgeon: Abbie Sons, MD;  Location: [facility];  Service: Urology;  Laterality: N/A;  MAC with local   COLONOSCOPY     EYE SURGERY     HEEL SPUR SURGERY Bilateral 2016   KNEE ARTHROSCOPY Left 3/16   KNEE ARTHROSCOPY Left 03/10/2017   Procedure: ARTHROSCOPY KNEE;  Surgeon: Dorna Leitz, MD;  Location: [place];  Service: Orthopedics;  Laterality: Left;   LASIK  2006   bilateral   [place]     Social History   Social History Narrative   [employer]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   family history includes Anesthesia problems in his mother; Arthritis in his son; Asthma in an other family member; Colitis in his father; Hypertension in his mother; Lung cancer in his father, paternal grandfather, and paternal uncle; Multiple sclerosis in his mother; Pulmonary fibrosis in his father.   Review of Systems As per HPI

## 2019-04-15 NOTE — Patient Instructions (Addendum)
It was nice to see you today, sorry you have been having some problems.  As we discussed suspect your hemorrhoids are bleeding again though you may have a bit of  anal tear or fissure.  Since you are up-to-date and had a colonoscopy in 2018 and you have had problems like this before I do not think that sort of investigation needs to be done again right now.   My recommendations are for you to take 1 or 2 tablespoons of Benefiber every night and hopefully that will make your bowel movements easier.  I have also sent a prescription for hydrocortisone cream to use as needed I would use it nightly for a few nights at least to see if that does not reduce the swelling of the hemorrhoids.  If this fails to resolve or you have any other questions I think we may need to see you in the office.  Please let me know if this problem does not satisfactorily improve.  I appreciate the opportunity to care for you.  [name], MD, [facility]

## 2019-04-18 ENCOUNTER — Encounter: Payer: Self-pay | Admitting: Physician Assistant

## 2019-04-26 ENCOUNTER — Encounter: Payer: Self-pay | Admitting: Physician Assistant

## 2019-04-26 DIAGNOSIS — M94262 Chondromalacia, left knee: Secondary | ICD-10-CM

## 2019-04-26 DIAGNOSIS — G8929 Other chronic pain: Secondary | ICD-10-CM

## 2019-05-24 ENCOUNTER — Other Ambulatory Visit: Payer: Self-pay | Admitting: Orthopedic Surgery

## 2019-05-24 ENCOUNTER — Encounter: Payer: Self-pay | Admitting: Physician Assistant

## 2019-05-24 DIAGNOSIS — M5416 Radiculopathy, lumbar region: Secondary | ICD-10-CM

## 2019-06-14 ENCOUNTER — Other Ambulatory Visit: Payer: BLUE CROSS/BLUE SHIELD

## 2019-06-22 ENCOUNTER — Inpatient Hospital Stay: Admission: RE | Admit: 2019-06-22 | Payer: BLUE CROSS/BLUE SHIELD | Source: Ambulatory Visit

## 2019-06-24 ENCOUNTER — Ambulatory Visit
Admission: RE | Admit: 2019-06-24 | Discharge: 2019-06-24 | Disposition: A | Discharge status: Home / Self Care | Payer: Medicaid Other | Source: Ambulatory Visit | Attending: Pain Medicine | Admitting: Pain Medicine

## 2019-06-24 DIAGNOSIS — S83282S Other tear of lateral meniscus, current injury, left knee, sequela: Secondary | ICD-10-CM

## 2019-06-24 DIAGNOSIS — M94262 Chondromalacia, left knee: Secondary | ICD-10-CM

## 2019-06-24 DIAGNOSIS — M25562 Pain in left knee: Secondary | ICD-10-CM

## 2019-06-24 DIAGNOSIS — G8929 Other chronic pain: Secondary | ICD-10-CM

## 2019-06-24 DIAGNOSIS — S83242S Other tear of medial meniscus, current injury, left knee, sequela: Secondary | ICD-10-CM

## 2019-06-24 MED ORDER — GADOBENATE DIMEGLUMINE 529 MG/ML IV SOLN
20.0000 mL | Freq: Once | INTRAVENOUS | Status: AC | PRN
  Administered 2019-06-24: 20 mL via INTRAVENOUS

## 2019-07-08 ENCOUNTER — Encounter: Payer: Self-pay | Admitting: Physician Assistant

## 2019-07-09 ENCOUNTER — Encounter: Payer: Self-pay | Admitting: Family Medicine

## 2019-07-09 ENCOUNTER — Ambulatory Visit (INDEPENDENT_AMBULATORY_CARE_PROVIDER_SITE_OTHER): Payer: Medicaid Other | Admitting: Family Medicine

## 2019-07-09 ENCOUNTER — Other Ambulatory Visit: Payer: Self-pay

## 2019-07-09 DIAGNOSIS — F4323 Adjustment disorder with mixed anxiety and depressed mood: Secondary | ICD-10-CM

## 2019-07-09 DIAGNOSIS — E860 Dehydration: Secondary | ICD-10-CM

## 2019-07-09 NOTE — Progress Notes (Signed)
[name]  MRN: [id number] DOB: 02-18-1971  Subjective:  HPI   The patient is a 48 year old male who presents via electronic device.  He complains of anxiety that began almost 1 week ago.  He states that he thinks he had a panic attack last Wednesday.  Since then he has been anxious, having nausea, headaches, dizziness and fatigue.  He states his wife noticed he was not drinking his normal amount of fluids and thought he may be dehydrated.    Virtual Visit via Video Note  I connected with [name] on 07/09/19 at  9:20 AM EDT by a video enabled telemedicine application and verified that I am speaking with the correct person using two identifiers.  Location: Patient: home Provider: home   I discussed the limitations of evaluation and management by telemedicine and the availability of in person appointments. The patient expressed understanding and agreed to proceed.   Patient Active Problem List   Diagnosis Date Noted  . Chronic knee pain (Primary Area of Pain) (Left) 04/16/2018  . Chronic lower extremity pain (Secondary Area of Pain) (Left) 04/16/2018  . 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 Chronic pain syndrome 04/16/2018  . 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 Morbid obesity ([place]) 10/10/2016  . Anxiety 10/10/2016  . Hyperlipidemia   . Right upper quadrant abdominal pain 07/12/2013  . Routine general medical examination at a health care facility 01/04/2013  . Obesity 01/04/2013  . Personal history of colonic polyps = adenoma 01/24/2012   Past Medical History:  Diagnosis Date  . Carpal tunnel syndrome    bileteral - "Minor"  . Complication of anesthesia 1996   ran a fever after sinus surgery.  In [place].  . Family history of adverse reaction to anesthesia    MOM-UNSURE WHAT HAPPENED WITH HIS MOM  . Heart murmur   . Hyperlipidemia   . Mitral valve prolapse   . Personal history of colonic  polyps = adenoma 01/24/2012   5 mm adenoma removed 01/2012 [facility]   . Rectal bleeding    polyp removed 01/19/2012 pre canerous  . Right bundle branch block   . Sleep apnea    USES CPAP   Past Surgical History:  Procedure Laterality Date  . ACHILLES TENDON SURGERY Right 06/12/2018   Procedure: RIGHT ACHILLES TENDON REPAIR AND EXCISION OF HEEL SPUR;  Surgeon: Melrose Nakayama, MD;  Location: [place];  Service: Orthopedics;  Laterality: Right;  . CHONDROPLASTY  02/27/2012   Procedure: CHONDROPLASTY;  Surgeon: Alta Corning, MD;  Location: [place];  Service: Orthopedics;  Laterality: Right;  patella-femoral joint  . CIRCUMCISION N/A 07/10/2018   Procedure: CIRCUMCISION ADULT;  Surgeon: Abbie Sons, MD;  Location: [facility];  Service: Urology;  Laterality: N/A;  MAC with local  . COLONOSCOPY    . EYE SURGERY    . HEEL SPUR SURGERY Bilateral 2016  . KNEE ARTHROSCOPY Left 3/16  . KNEE ARTHROSCOPY Left 03/10/2017   Procedure: ARTHROSCOPY KNEE;  Surgeon: Dorna Leitz, MD;  Location: [place];  Service: Orthopedics;  Laterality: Left;  . LASIK  2006   bilateral  . NASAL TURBINATE REDUCTION  1996  . VASECTOMY     Social History   Socioeconomic History  . Marital status: Married    Spouse name: Not on file  . Number of children: 8  . Years of education: 79  . Highest education level: Not on file  Occupational History  . 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 Financial resource strain: Not on file  . Food insecurity    Worry: Not on file    Inability: Not on file  . Transportation needs    Medical: Not on file    Non-medical: Not on file  Tobacco Use  . Smoking status: Never Smoker  . Smokeless tobacco: Never Used  Substance and Sexual Activity  . Alcohol use: Not Currently    Alcohol/week: 0.0 standard drinks    Frequency: Never    Comment: occasional beer/wine  . Drug use: No  . Sexual activity: Yes  Lifestyle  . Physical activity     Days per week: Not on file    Minutes per session: Not on file  . Stress: Not on file  Relationships  . Social Herbalist on phone: Not on file    Gets together: Not on file    Attends religious service: Not on file    Active member of club or organization: Not on file    Attends meetings of clubs or organizations: Not on file    Relationship status: Not on file  . Intimate partner violence    Fear of current or ex partner: Not on file    Emotionally abused: Not on file    Physically abused: Not on file    Forced sexual activity: Not on file  Other Topics Concern  . 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   Outpatient Encounter Medications as of 07/09/2019  Medication Sig  . acetaminophen (TYLENOL 8 HOUR ARTHRITIS PAIN) 650 MG CR tablet Take 650 mg by mouth every 8 (eight) hours as needed for pain.  . hydrocortisone (ANUSOL-HC) 2.5 % rectal cream Place rectally 2 (two) times daily as needed for hemorrhoids.  Marland Kitchen ipratropium-albuterol (DUONEB) 0.5-2.5 (3) MG/3ML SOLN Take 3 mLs by nebulization every 6 (six) hours as needed.   Facility-Administered Encounter Medications as of 07/09/2019  Medication  . ipratropium-albuterol (DUONEB) 0.5-2.5 (3) MG/3ML nebulizer solution 3 mL   No Known Allergies  Review of Systems  Constitutional: Positive for malaise/fatigue. Negative for chills and fever.  HENT: Negative for congestion, ear pain, sinus pain and sore throat.   Gastrointestinal: Positive for nausea.  Neurological: Positive for dizziness and headaches.  Psychiatric/Behavioral: The patient is nervous/anxious.     Objective:  There were no vitals taken for this visit.  During telephonic conference, do not hear any sound of physical distress. Admits to some stress trying to work in [employer]. Also, feeling a little sad since his father's death in 11/29/2018. No suicidal ideation and anxious/nervous sensation has calmed a great deal since he has worked on rehydration yesterday and today. Depression screen [facility] 2/9 12/27/2018 08/21/2018 04/30/2018 04/16/2018  Decreased Interest 1 0 0 1  Down, Depressed, Hopeless 2 0 0 1  PHQ - 2 Score 3 0 0 2  Altered sleeping 2 - - 0  Tired, decreased energy 2 - - 0  Change in appetite 3 - - 0  Feeling bad or failure about yourself  0 - - 0  Trouble concentrating 0 - - 0  Moving slowly or fidgety/restless 0 - - 0  Suicidal thoughts 0 - - 0  PHQ-9 Score 10 - - 2  Difficult doing work/chores Somewhat difficult - - Not difficult at all    Assessment and Plan :  1. Dehydration Felt hot, fatigued with headache and nausea a couple days ago. Has been trying a new medication (Etodolac - switched from Meloxicam) last week and thought symptoms got better after he stopped it and started working on rehydration. Continue increase intake in fluids and fresh fruits/vegetable. Will check CBC, CMP and TSH for metabolic disorder. - CBC with Differential/Platelet; Future - Comprehensive metabolic panel; Future - TSH; Future  2. 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 Sleep fairly well and normal appetite. No suicidal ideation. May need SSRI and/or counseling. Will check labs for any chemical imbalances first. Patient agrees with this plan. - CBC with Differential/Platelet; Future - Comprehensive metabolic panel; Future - TSH; Future  I discussed the assessment and treatment plan with the patient. The patient was provided an opportunity to ask questions and all were answered. The patient agreed with the plan and demonstrated an understanding of the instructions.   The patient was advised to call back or seek an in-person evaluation if the symptoms worsen or if the condition fails to improve  as anticipated.  I provided 14 minutes of non-face-to-face time during this encounter.

## 2019-07-13 ENCOUNTER — Ambulatory Visit
Admission: RE | Admit: 2019-07-13 | Discharge: 2019-07-13 | Disposition: A | Discharge status: Home / Self Care | Payer: Medicaid Other | Source: Ambulatory Visit | Attending: Orthopedic Surgery | Admitting: Orthopedic Surgery

## 2019-07-13 ENCOUNTER — Other Ambulatory Visit: Payer: Self-pay

## 2019-07-13 DIAGNOSIS — M5416 Radiculopathy, lumbar region: Secondary | ICD-10-CM

## 2019-08-19 NOTE — Progress Notes (Deleted)
Cardiology Office Note  Date:  08/19/2019   ID:  [name], DOB 07/25/1971, MRN [id number]  PCP:  Trinna Post, PA-C   No chief complaint on file.   HPI:  [name] is a Pleasant 48 year old gentleman with  morbid obesity, runs a local restaurant,  severe stress, long work hours,   Prior hx of chest pain and shortness of breath.  He presents for f/u of his  chest pain symptoms  Bone spur, cast on right foot Starting physical therapy this week Heatrtburn better without soda Thinks he has lost some weight Denies any chest pain or shortness of breath on exertion Would like to check some lab work on his visit today in preparation for primary care visit September 2019  No recent episodes of dizziness  He denies any significant family history of coronary artery disease Reports that his mother died of MS, father still alive, no coronary disease  Wife takes care of 8 children, many of them have health conditions. He works long days, very hard in intensity,  Previously reported periodic headaches that would come and go Some nocturia, poor sleep hygiene Does not do any regular exercise program Previously withstress eating  Previous records reviewed stress test 2 years ago, 08/2014, no ischemia Echo 11/2014 [facility]  cardiac function  EKG personally reviewed by myself on todays visit  shows sinus rhythm and RBBB, rate 60 bpm  He has chronic Achilles problems, had surgery left ankle Weight >300 pounds Non smoker, no diabetes   PMH:   has a past medical history of Carpal tunnel syndrome, Complication of anesthesia (1996), Family history of adverse reaction to anesthesia, Heart murmur, Hyperlipidemia, Mitral valve prolapse, Personal history of colonic polyps = adenoma (01/24/2012), Rectal bleeding, Right bundle branch block, and Sleep apnea.  PSH:    Past Surgical History:  Procedure Laterality Date  . ACHILLES TENDON SURGERY Right 06/12/2018   Procedure: RIGHT  ACHILLES TENDON REPAIR AND EXCISION OF HEEL SPUR;  Surgeon: Melrose Nakayama, MD;  Location: [place];  Service: Orthopedics;  Laterality: Right;  . CHONDROPLASTY  02/27/2012   Procedure: CHONDROPLASTY;  Surgeon: Alta Corning, MD;  Location: [place];  Service: Orthopedics;  Laterality: Right;  patella-femoral joint  . CIRCUMCISION N/A 07/10/2018   Procedure: CIRCUMCISION ADULT;  Surgeon: Abbie Sons, MD;  Location: [facility];  Service: Urology;  Laterality: N/A;  MAC with local  . COLONOSCOPY    . EYE SURGERY    . HEEL SPUR SURGERY Bilateral 2016  . KNEE ARTHROSCOPY Left 3/16  . KNEE ARTHROSCOPY Left 03/10/2017   Procedure: ARTHROSCOPY KNEE;  Surgeon: Dorna Leitz, MD;  Location: [place];  Service: Orthopedics;  Laterality: Left;  . LASIK  2006   bilateral  . NASAL TURBINATE REDUCTION  1996  . VASECTOMY      Current Outpatient Medications  Medication Sig Dispense Refill  . acetaminophen (TYLENOL 8 HOUR ARTHRITIS PAIN) 650 MG CR tablet Take 650 mg by mouth every 8 (eight) hours as needed for pain.    . hydrocortisone (ANUSOL-HC) 2.5 % rectal cream Place rectally 2 (two) times daily as needed for hemorrhoids. (Patient not taking: Reported on 07/09/2019) 30 g 0  . ipratropium-albuterol (DUONEB) 0.5-2.5 (3) MG/3ML SOLN Take 3 mLs by nebulization every 6 (six) hours as needed. (Patient not taking: Reported on 07/09/2019) 360 mL 0   Current Facility-Administered Medications  Medication Dose Route Frequency Provider Last Rate Last Dose  . ipratropium-albuterol (DUONEB) 0.5-2.5 (3) MG/3ML nebulizer solution  3 mL  3 mL Nebulization Once Carles Collet M, PA-C         Allergies:   Patient has no known allergies.   Social History:  The patient  reports that he has never smoked. He has never used smokeless tobacco. He reports previous alcohol use. He reports that he does not use drugs.   Family History:   family history includes Anesthesia problems in his mother; Arthritis in his son; Asthma in an  other family member; Colitis in his father; Hypertension in his mother; Lung cancer in his father, paternal grandfather, and paternal uncle; Multiple sclerosis in his mother; Pulmonary fibrosis in his father.    Review of Systems: Review of Systems  Constitutional: Negative.   Respiratory: Negative.   Cardiovascular: Negative.   Gastrointestinal: Negative.   Musculoskeletal: Positive for joint pain.  Neurological: Negative.   Psychiatric/Behavioral: Negative.   All other systems reviewed and are negative.    PHYSICAL EXAM: VS:  There were no vitals taken for this visit. , BMI There is no height or weight on file to calculate BMI. Constitutional:  oriented to person, place, and time. No distress.  HENT:  Head: Normocephalic and atraumatic.  Eyes:  no discharge. No scleral icterus.  Neck: Normal range of motion. Neck supple. No JVD present.  Cardiovascular: Normal rate, regular rhythm, normal heart sounds and intact distal pulses. Exam reveals no gallop and no friction rub. No edema No murmur heard. Pulmonary/Chest: Effort normal and breath sounds normal. No stridor. No respiratory distress.  no wheezes.  no rales.  no tenderness.  Abdominal: Soft.  no distension.  no tenderness.  Musculoskeletal: Normal range of motion.  no  tenderness or deformity.  Neurological:  normal muscle tone. Coordination normal. No atrophy Skin: Skin is warm and dry. No rash noted. not diaphoretic.  Psychiatric:  normal mood and affect. behavior is normal. Thought content normal.     Recent Labs: 09/12/2018: TSH 2.40    Lipid Panel Lab Results  Component Value Date   CHOL 190 07/23/2018   HDL 43 07/23/2018   LDLCALC 127 (H) 07/23/2018   TRIG 100 07/23/2018      Wt Readings from Last 3 Encounters:  01/02/19 (!) 311 lb (141.1 kg)  12/27/18 (!) 311 lb 12.8 oz (141.4 kg)  10/24/18 (!) 321 lb 2 oz (145.7 kg)       ASSESSMENT AND PLAN:  Pure hypercholesterolemia Lab work drawn  today weight is down 10 pounds after he stopped drinking soda  Morbid obesity ([place]) We have encouraged continued exercise, careful diet management in an effort to lose weight. Weight down 10 pounds  Chest tightness Denies any significant chest tightness or shortness of breath No further workup needed Previously felt to be from stress  Shortness of breath Stable symptoms No further testing at this time  Anxiety previouslystressed given his long work hours, taking care of a children some with medical issues   Total encounter time more than 25 minutes  Greater than 50% was spent in counseling and coordination of care with the patient   Disposition:   F/U  as needed   No orders of the defined types were placed in this encounter.    Signed, Esmond Plants, M.D., Ph.D. 08/19/2019  [place], [place]

## 2019-08-20 ENCOUNTER — Ambulatory Visit: Payer: Medicaid Other | Admitting: Cardiovascular Disease

## 2019-08-23 ENCOUNTER — Encounter: Payer: BLUE CROSS/BLUE SHIELD | Admitting: Internal Medicine

## 2019-08-27 ENCOUNTER — Encounter: Payer: Self-pay | Admitting: Physician Assistant

## 2019-08-27 ENCOUNTER — Ambulatory Visit (INDEPENDENT_AMBULATORY_CARE_PROVIDER_SITE_OTHER): Payer: Medicaid Other | Admitting: Physician Assistant

## 2019-08-27 ENCOUNTER — Other Ambulatory Visit: Payer: Self-pay

## 2019-08-27 VITALS — BP 114/71 | HR 69 | Temp 96.2°F | Ht 76.0 in | Wt 317.0 lb

## 2019-08-27 DIAGNOSIS — Z0000 Encounter for general adult medical examination without abnormal findings: Secondary | ICD-10-CM | POA: Diagnosis not present

## 2019-08-27 DIAGNOSIS — R7989 Other specified abnormal findings of blood chemistry: Secondary | ICD-10-CM

## 2019-08-27 DIAGNOSIS — R7303 Prediabetes: Secondary | ICD-10-CM

## 2019-08-27 DIAGNOSIS — E7800 Pure hypercholesterolemia, unspecified: Secondary | ICD-10-CM

## 2019-08-27 NOTE — Progress Notes (Signed)
Patient: [name], Male    DOB: 09/21/1971, 48 y.o.   MRN: [id number] Visit Date: 08/27/2019  Today's Provider: Trinna Post, PA-C   Chief Complaint  Patient presents with  . Annual Exam   Subjective:     Annual physical exam [name] is a 48 y.o. male who presents today for health maintenance and complete physical. He feels fairly well.  Pt is due for lab work including Vitamin D.  He states it has been low in the past.  Pt is also concerned he has mold in his house and is concerned that it may be affected his health.   He reports not  exercising. He reports he is sleeping poorly.  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   Reports fatigue and says another doctor advised to have his vitamin D checked as he has a history of low vitamin D. He also has sleep apnea and is not compliant with his CPAP. He reports he has been under a lot of stress lately and using a CPAP at night is another burden.  Has mold at home and is concerned this could be causing chronic issues.   Morbid Obesity/Prediabetes: Was previously doing weight loss program at [facility] and had made progress in losing weight but then father died and he gained weight.   Wt Readings from Last 3 Encounters:  08/27/19 (!) 317 lb (143.8 kg)  01/02/19 (!) 311 lb (141.1 kg)  12/27/18 (!) 311 lb 12.8 oz (141.4 kg)    -----------------------------------------------------------------   Review of Systems  Constitutional: Positive for fatigue. Negative for activity change, appetite change, chills, diaphoresis, fever and unexpected weight change.  Respiratory: Positive for shortness of breath. Negative for apnea, cough, choking, chest tightness, wheezing and stridor.   Allergic/Immunologic: Negative.     Social History      He  reports that he has never smoked. He has never used smokeless tobacco. He reports previous alcohol use. He reports that  he does not use drugs.       Social History   Socioeconomic History  . Marital status: Married    Spouse name: Not on file  . Number of children: 8  . Years of education: 49  . Highest education level: Not on file  Occupational History  . 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 Financial resource strain: Not on file  . Food insecurity    Worry: Not on file    Inability: Not on file  . Transportation needs    Medical: Not on file    Non-medical: Not on file  Tobacco Use  . Smoking status: Never Smoker  . Smokeless tobacco: Never Used  Substance and Sexual Activity  . Alcohol use: Not Currently    Alcohol/week: 0.0 standard drinks    Frequency: Never    Comment: occasional beer/wine  . Drug use: No  . Sexual activity: Yes  Lifestyle  . Physical activity    Days per week: Not on file    Minutes per session: Not on file  . Stress: Not on file  Relationships  . Social Herbalist on phone: Not on file    Gets together: Not on file    Attends religious service: Not on file    Active member of club or organization: Not on file    Attends meetings of clubs  or organizations: Not on file    Relationship status: Not on file  Other Topics Concern  . 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    Past Medical History:  Diagnosis Date  . Carpal tunnel syndrome    bileteral - "Minor"  . Complication of anesthesia 1996   ran a fever after sinus surgery.  In [place].  . Family history of adverse reaction to anesthesia    MOM-UNSURE WHAT HAPPENED WITH HIS MOM  . Heart murmur   . Hyperlipidemia   . Mitral valve prolapse   . Personal history of colonic polyps = adenoma 01/24/2012   5 mm adenoma removed 01/2012 [name]   . Rectal bleeding    polyp removed 01/19/2012 pre canerous  . Right bundle branch block   . Sleep  apnea    USES CPAP     Patient Active Problem List   Diagnosis Date Noted  . Chronic knee pain (Primary Area of Pain) (Left) 04/16/2018  . Chronic lower extremity pain (Secondary Area of Pain) (Left) 04/16/2018  . Chronic low back pain [facility] Area of Pain) (Bilateral) (L>R) with sciatica (Left) 04/16/2018  . Chronic pain syndrome 04/16/2018  . 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 Morbid obesity ([place]) 10/10/2016  . Anxiety 10/10/2016  . Hyperlipidemia   . Right upper quadrant abdominal pain 07/12/2013  . Routine general medical examination at a health care facility 01/04/2013  . Obesity 01/04/2013  . Personal history of colonic polyps = adenoma 01/24/2012    Past Surgical History:  Procedure Laterality Date  . ACHILLES TENDON SURGERY Right 06/12/2018   Procedure: RIGHT ACHILLES TENDON REPAIR AND EXCISION OF HEEL SPUR;  Surgeon: Melrose Nakayama, MD;  Location: [place];  Service: Orthopedics;  Laterality: Right;  . CHONDROPLASTY  02/27/2012   Procedure: CHONDROPLASTY;  Surgeon: Alta Corning, MD;  Location: [place];  Service: Orthopedics;  Laterality: Right;  patella-femoral joint  . CIRCUMCISION N/A 07/10/2018   Procedure: CIRCUMCISION ADULT;  Surgeon: Abbie Sons, MD;  Location: [facility];  Service: Urology;  Laterality: N/A;  MAC with local  . COLONOSCOPY    . EYE SURGERY    . HEEL SPUR SURGERY Bilateral 2016  . KNEE ARTHROSCOPY Left 3/16  . KNEE ARTHROSCOPY Left 03/10/2017   Procedure: ARTHROSCOPY KNEE;  Surgeon: Dorna Leitz, MD;  Location: [place];  Service: Orthopedics;  Laterality: Left;  . LASIK  2006   bilateral  . NASAL TURBINATE REDUCTION  1996  . VASECTOMY      Family History        Family Status  Relation Name Status  . Mother  Deceased  . Father  Deceased       healthy  . Sister  Alive       healthy  . Other  (Not Specified)  . Son  Alive  . Daughter  Alive  . PGF  Deceased  . [name]  Deceased  . Neg Hx  (Not Specified)        His family  history includes Anesthesia problems in his mother; Arthritis in his son; Asthma in an other family member; Colitis in his father; Hypertension in his mother; Lung cancer in his father, paternal grandfather, and paternal uncle; Multiple sclerosis in his mother; Pulmonary fibrosis in his father. There is no history of Colon cancer.  No Known Allergies   Current Outpatient Medications:  .  acetaminophen (TYLENOL 8 HOUR ARTHRITIS PAIN) 650 MG CR tablet, Take 650 mg by mouth every 8 (eight) hours as needed for pain., Disp: , Rfl:  .  hydrocortisone (ANUSOL-HC) 2.5 % rectal cream, Place rectally 2 (two) times daily as needed for hemorrhoids. (Patient not taking: Reported on 07/09/2019), Disp: 30 g, Rfl: 0 .  ipratropium-albuterol (DUONEB) 0.5-2.5 (3) MG/3ML SOLN, Take 3 mLs by nebulization every 6 (six) hours as needed. (Patient not taking: Reported on 07/09/2019), Disp: 360 mL, Rfl: 0  Current Facility-Administered Medications:  .  ipratropium-albuterol (DUONEB) 0.5-2.5 (3) MG/3ML nebulizer solution 3 mL, 3 mL, Nebulization, Once, Trinna Post, PA-C   Patient Care Team: Paulene Floor as PCP - General (Physician Assistant) Minna Merritts, MD as Consulting Physician (Cardiology)    Objective:    Vitals: Temp (!) 96.2 F (35.7 C) (Temporal)   Ht _0  (1.93 m)   Wt (!) 317 lb (143.8 kg)   BMI 38.59 kg/m    Vitals:   08/27/19 0914  Temp: (!) 96.2 F (35.7 C)  TempSrc: Temporal  Weight: (!) 317 lb (143.8 kg)  Height: _1  (1.93 m)     Physical Exam Constitutional:      Appearance: Normal appearance. He is obese.  Cardiovascular:     Rate and Rhythm: Normal rate and regular rhythm.     Heart sounds: Normal heart sounds.  Pulmonary:     Breath sounds: Normal breath sounds.  Abdominal:     General: Bowel sounds are normal.     Palpations: Abdomen is soft.  Skin:    General: Skin is warm and dry.  Neurological:     Mental Status: He is alert and oriented to  person, place, and time. Mental status is at baseline.  Psychiatric:        Mood and Affect: Mood normal.        Behavior: Behavior normal.      Depression Screen PHQ 2/9 Scores 12/27/2018 08/21/2018 04/30/2018 04/16/2018  PHQ - 2 Score 3 0 0 2  PHQ- 9 Score 10 - - 2       Assessment & Plan:     Routine Health Maintenance and Physical Exam  Exercise Activities and Dietary recommendations Goals   None     Immunization History  Administered Date(s) Administered  . Influenza Whole 09/28/2009, 10/13/2010, 09/28/2013  . Influenza, Seasonal, Injecte, Preservative Fre 10/05/2016  . Influenza-Unspecified 10/12/2017  . Td 12/12/2005  . Tdap 04/21/2015    Health Maintenance  Topic Date Due  . INFLUENZA VACCINE  07/13/2019  . COLONOSCOPY  06/26/2022  . TETANUS/TDAP  04/20/2025  . HIV Screening  Completed     Discussed health benefits of physical activity, and encouraged him to engage in regular exercise appropriate for his age and condition.    1. Annual physical exam  Will need to get flu shot at health department due to adult Medicaid.   2. Low vitamin D level  - Vitamin D (25 hydroxy)  3. Pure hypercholesterolemia  - Lipid Profile  4. Morbid obesity ([place])  - Comprehensive Metabolic Panel (CMET)  5. Prediabetes  - HgB A1c - CBC with Differential  The entirety of the information documented in the History of Present Illness, Review of Systems and Physical Exam were personally obtained by me. Portions of this information were initially documented by Ashley Royalty, CMA and reviewed by me for thoroughness and accuracy.    --------------------------------------------------------------------  Trinna Post, PA-C  [facility]

## 2019-08-27 NOTE — Patient Instructions (Signed)

## 2019-08-28 ENCOUNTER — Other Ambulatory Visit: Payer: Self-pay | Admitting: Physician Assistant

## 2019-08-28 ENCOUNTER — Other Ambulatory Visit: Payer: Self-pay | Admitting: Orthopaedic Surgery

## 2019-08-28 DIAGNOSIS — R7989 Other specified abnormal findings of blood chemistry: Secondary | ICD-10-CM

## 2019-08-28 DIAGNOSIS — M7661 Achilles tendinitis, right leg: Secondary | ICD-10-CM

## 2019-08-28 LAB — COMPREHENSIVE METABOLIC PANEL
ALT: 36 IU/L (ref 0–44)
AST: 21 IU/L (ref 0–40)
Albumin/Globulin Ratio: 1.9 (ref 1.2–2.2)
Albumin: 4.6 g/dL (ref 4.0–5.0)
Alkaline Phosphatase: 58 IU/L (ref 39–117)
BUN/Creatinine Ratio: 17 (ref 9–20)
BUN: 19 mg/dL (ref 6–24)
Bilirubin Total: 0.5 mg/dL (ref 0.0–1.2)
CO2: 23 mmol/L (ref 20–29)
Calcium: 9.2 mg/dL (ref 8.7–10.2)
Chloride: 101 mmol/L (ref 96–106)
Creatinine, Ser: 1.1 mg/dL (ref 0.76–1.27)
GFR calc Af Amer: 93 mL/min/{1.73_m2} (ref >59)
GFR calc non Af Amer: 80 mL/min/{1.73_m2} (ref >59)
Globulin, Total: 2.4 g/dL (ref 1.5–4.5)
Glucose: 97 mg/dL (ref 65–99)
Potassium: 4.4 mmol/L (ref 3.5–5.2)
Sodium: 140 mmol/L (ref 134–144)
Total Protein: 7 g/dL (ref 6.0–8.5)

## 2019-08-28 LAB — CBC WITH DIFFERENTIAL/PLATELET
Basophils Absolute: 0.1 10*3/uL (ref 0.0–0.2)
Basos: 1 %
EOS (ABSOLUTE): 0.2 10*3/uL (ref 0.0–0.4)
Eos: 3 %
Hematocrit: 42.9 % (ref 37.5–51.0)
Hemoglobin: 15.1 g/dL (ref 13.0–17.7)
Immature Grans (Abs): 0 10*3/uL (ref 0.0–0.1)
Immature Granulocytes: 1 %
Lymphocytes Absolute: 1.9 10*3/uL (ref 0.7–3.1)
Lymphs: 31 %
MCH: 30.8 pg (ref 26.6–33.0)
MCHC: 35.2 g/dL (ref 31.5–35.7)
MCV: 88 fL (ref 79–97)
Monocytes Absolute: 0.5 10*3/uL (ref 0.1–0.9)
Monocytes: 8 %
Neutrophils Absolute: 3.5 10*3/uL (ref 1.4–7.0)
Neutrophils: 56 %
Platelets: 256 10*3/uL (ref 150–450)
RBC: 4.9 x10E6/uL (ref 4.14–5.80)
RDW: 12.4 % (ref 11.6–15.4)
WBC: 6.1 10*3/uL (ref 3.4–10.8)

## 2019-08-28 LAB — HEMOGLOBIN A1C
Est. average glucose Bld gHb Est-mCnc: 117 mg/dL
Hgb A1c MFr Bld: 5.7 % — ABNORMAL HIGH (ref 4.8–5.6)

## 2019-08-28 LAB — LIPID PANEL
Chol/HDL Ratio: 4.6 ratio (ref 0.0–5.0)
Cholesterol, Total: 188 mg/dL (ref 100–199)
HDL: 41 mg/dL (ref >39)
LDL Chol Calc (NIH): 127 mg/dL — ABNORMAL HIGH (ref 0–99)
Triglycerides: 111 mg/dL (ref 0–149)
VLDL Cholesterol Cal: 20 mg/dL (ref 5–40)

## 2019-08-28 LAB — VITAMIN D 25 HYDROXY (VIT D DEFICIENCY, FRACTURES): Vit D, 25-Hydroxy: 18.6 ng/mL — ABNORMAL LOW (ref 30.0–100.0)

## 2019-08-28 MED ORDER — VITAMIN D (ERGOCALCIFEROL) 1.25 MG (50000 UNIT) PO CAPS: 50000.0000 [IU] | ORAL_CAPSULE | ORAL | 0 refills | Status: AC

## 2019-09-01 ENCOUNTER — Other Ambulatory Visit: Payer: Medicaid Other

## 2019-09-13 ENCOUNTER — Other Ambulatory Visit: Payer: Medicaid Other

## 2019-09-14 ENCOUNTER — Ambulatory Visit
Admission: RE | Admit: 2019-09-14 | Discharge: 2019-09-14 | Disposition: A | Discharge status: Home / Self Care | Payer: Medicaid Other | Source: Ambulatory Visit | Attending: Orthopaedic Surgery | Admitting: Orthopaedic Surgery

## 2019-09-14 ENCOUNTER — Other Ambulatory Visit: Payer: Self-pay

## 2019-09-14 DIAGNOSIS — M7661 Achilles tendinitis, right leg: Secondary | ICD-10-CM

## 2019-09-14 MED ORDER — GADOBENATE DIMEGLUMINE 529 MG/ML IV SOLN
20.0000 mL | Freq: Once | INTRAVENOUS | Status: AC | PRN
  Administered 2019-09-14: 20 mL via INTRAVENOUS

## 2019-09-16 ENCOUNTER — Other Ambulatory Visit: Payer: Medicaid Other

## 2019-09-16 IMAGING — MR MR KNEE*L* W/O CM
4 of 7 series · 24 of 40 positions shown · non-contrast
Comparison: 12/03/2016

CLINICAL DATA: Left posterior knee pain.

EXAM:
MRI OF THE LEFT KNEE WITHOUT CONTRAST
TECHNIQUE: Multiplanar, multisequence MR imaging of the knee was performed. No
intravenous contrast was administered.

[Series 4: T2 fat-sat · coronal · 4.0mm · 0.62mm/px · 7 of 31 slices shown (1 of 2)]
[im 1/31]
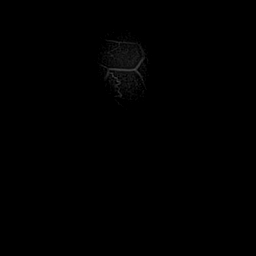
[im 6/31]
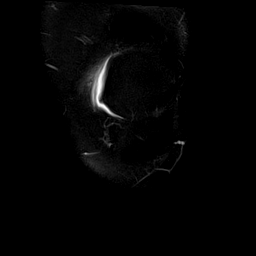
[im 11/31]
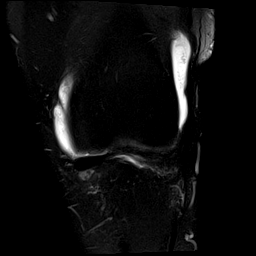
[im 16/31]
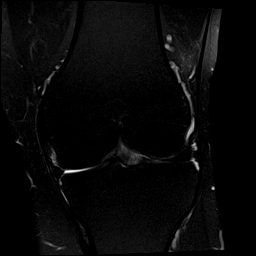
[im 21/31]
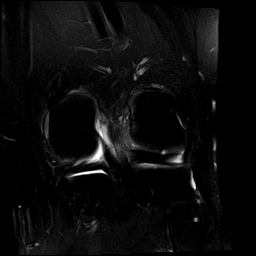
[im 26/31]
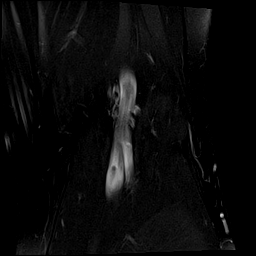
[im 31/31]
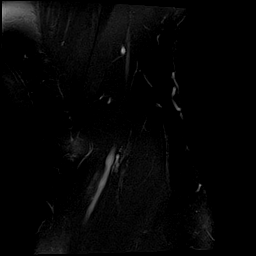

[Series 5: T1 · coronal · 4.0mm · 0.31mm/px · 5 of 31 slices shown]
[im 1/31]
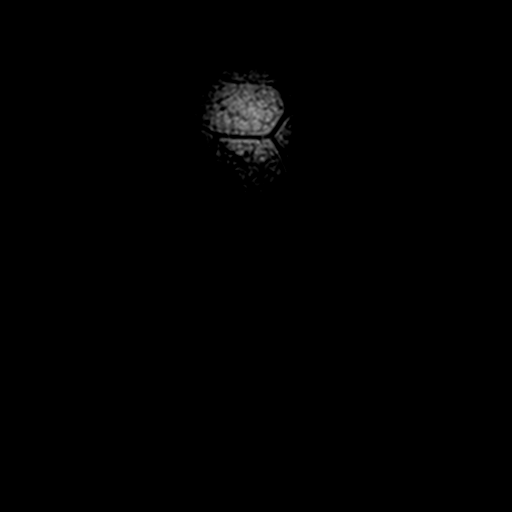
[im 7/31]
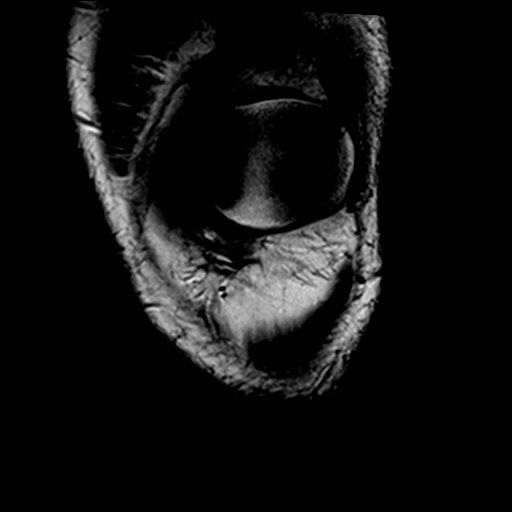
[im 13/31]
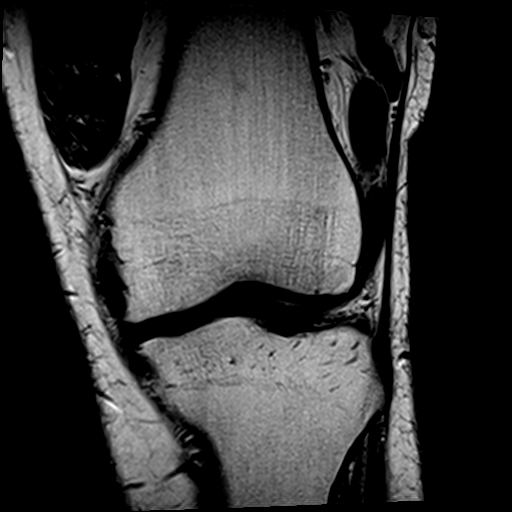
[im 19/31]
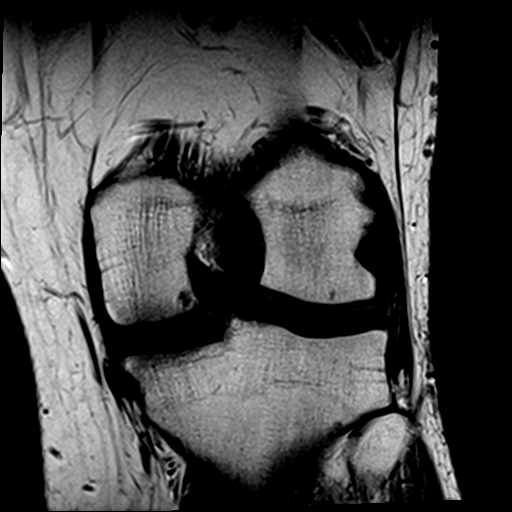
[im 31/31]
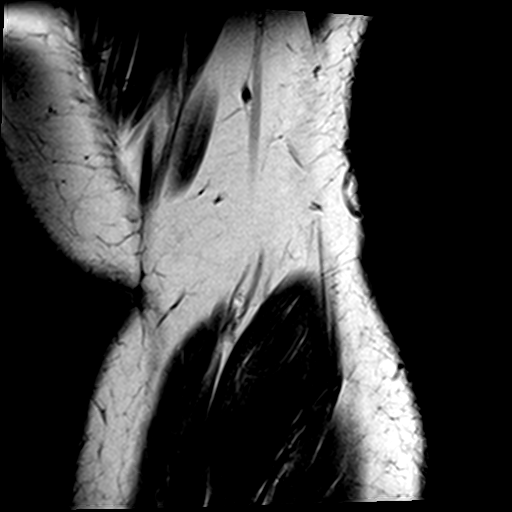

[Series 7: PD fat-sat · sagittal · 3.0mm · 0.31mm/px · 6 of 30 slices shown]
[im 1/30]
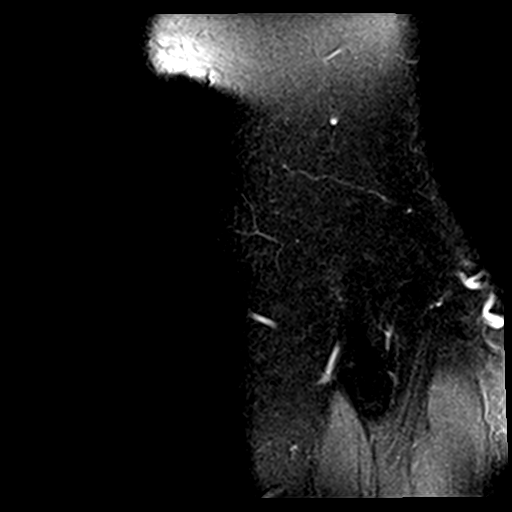
[im 6/30]
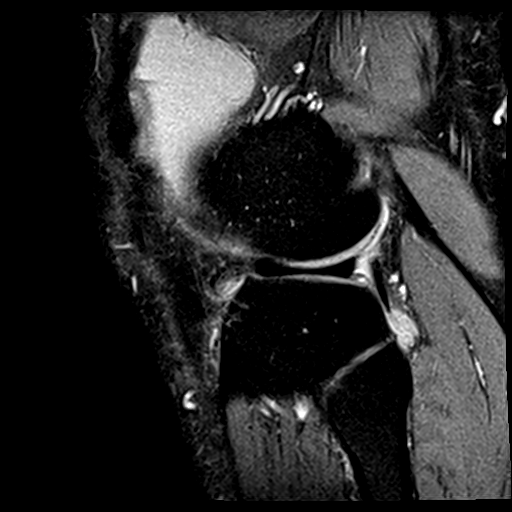
[im 12/30]
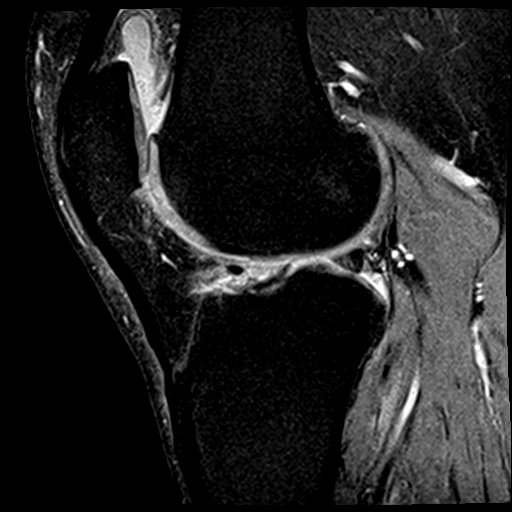
[im 18/30]
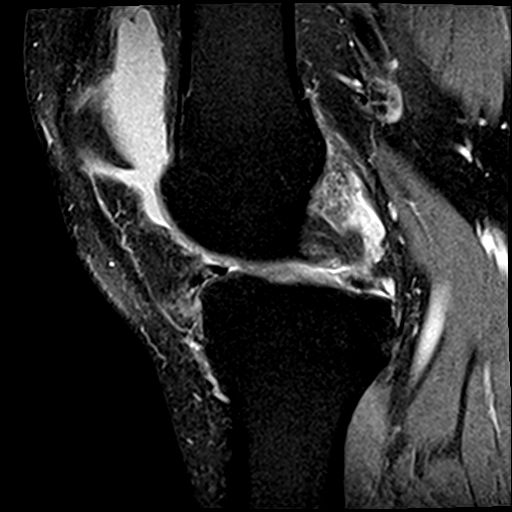
[im 24/30]
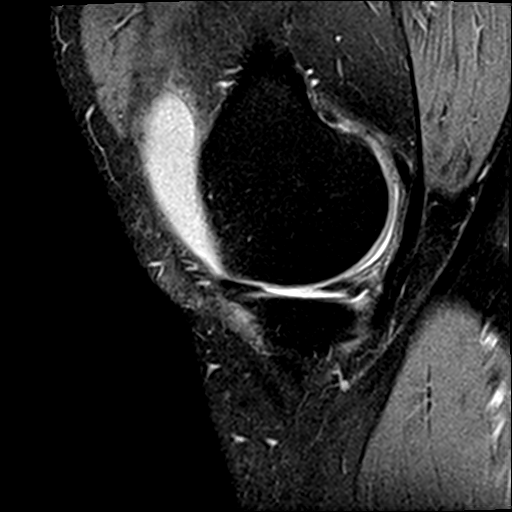
[im 30/30]
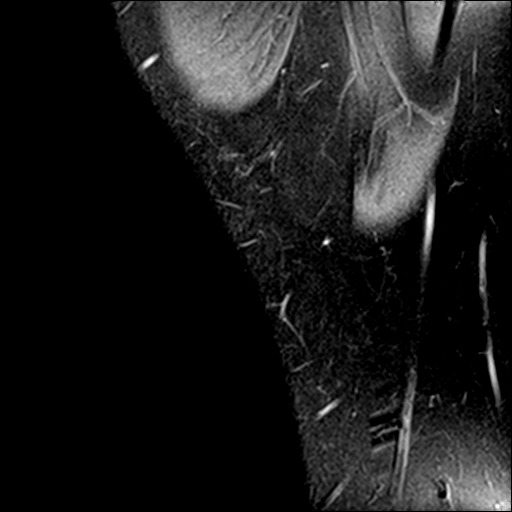

[Series 8: T2 fat-sat · sagittal · 3.0mm · 0.31mm/px · 6 of 30 slices shown (2 of 2)]
[im 1/30]
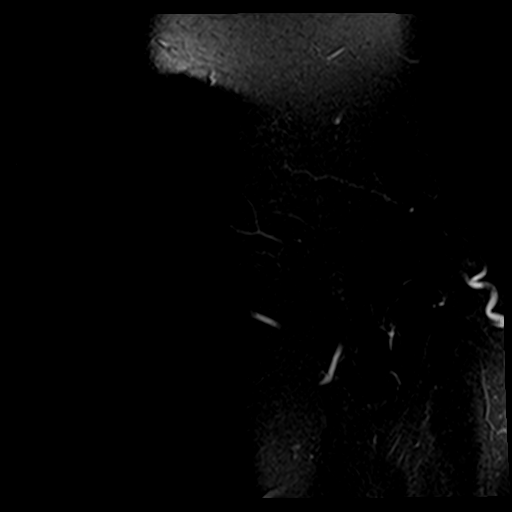
[im 6/30]
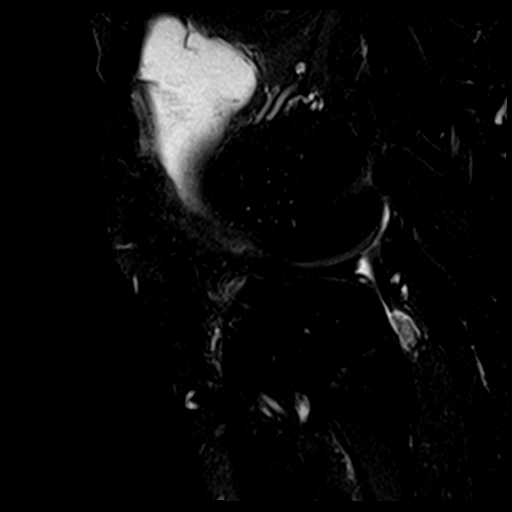
[im 12/30]
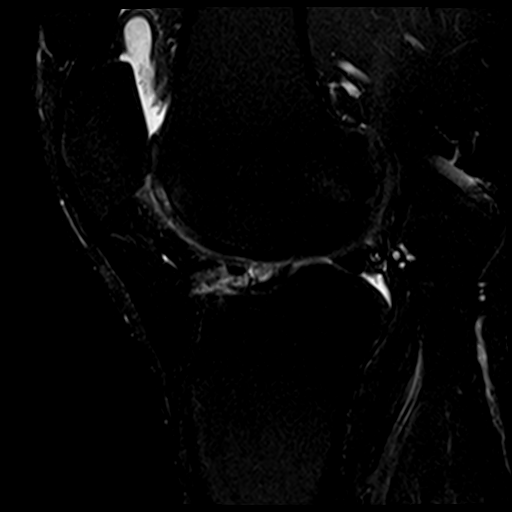
[im 18/30]
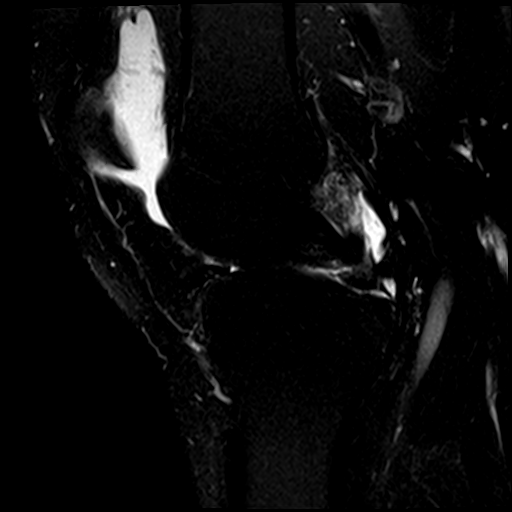
[im 24/30]
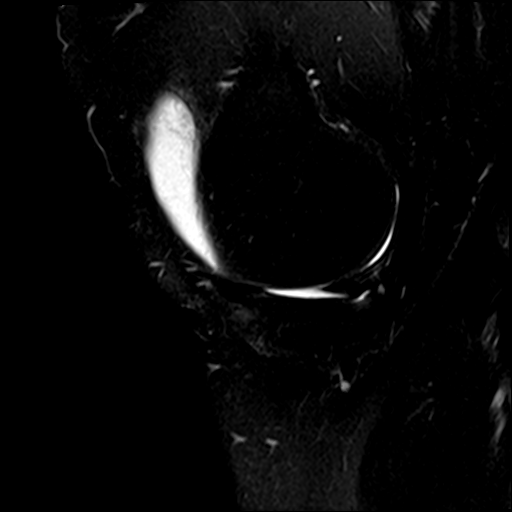
[im 30/30]
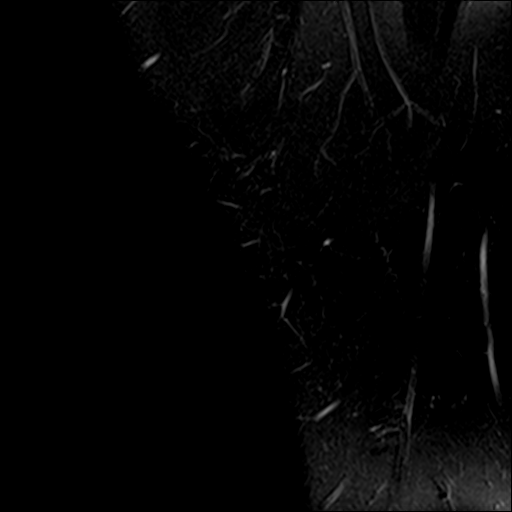

[24 of 40 positions shown; findings below may reference images not displayed]

FINDINGS: MENISCI

Medial meniscus: Attenuation of the posterior horn and body of the
medial meniscus consistent with prior meniscectomy.

Lateral meniscus:  Intact.

LIGAMENTS

Cruciates: Intact ACL. Increased signal and expansion of the ACL
consistent with mucinous degeneration. Intact PCL.

Collaterals: Medial collateral ligament is intact. Lateral
collateral ligament complex is intact.

CARTILAGE

Patellofemoral: Partial-thickness cartilage loss of the trochlear
groove.

Medial: High-grade partial-thickness cartilage loss of the medial
femorotibial compartment most severe in the medial femoral condyle.

Lateral: Mild partial-thickness cartilage loss of the lateral
femorotibial compartment.

Joint: Moderate-sized joint effusion. Normal Hoffa's fat. No plical
thickening.

Popliteal Fossa:  No Baker's cyst.  Intact popliteus tendon.

Extensor Mechanism: Intact quadriceps tendon. Intact patellar
tendon. Intact medial patellar retinaculum. Intact lateral patellar
retinaculum. Intact MPFL.

Bones:  No acute osseous abnormality.  No aggressive osseous lesion.

Other: No fluid collection or hematoma.  Muscles are normal.
IMPRESSION: 1. Tricompartmental cartilage abnormalities as described above.
2. Intact ACL with mucinous degeneration.
3. Attenuation of the posterior horn and body of the medial meniscus
consistent with prior meniscectomy.
4. Moderate-sized joint effusion.

## 2019-09-19 ENCOUNTER — Other Ambulatory Visit: Payer: Self-pay | Admitting: Orthopedic Surgery

## 2019-09-19 DIAGNOSIS — M25562 Pain in left knee: Secondary | ICD-10-CM

## 2019-10-03 ENCOUNTER — Other Ambulatory Visit: Payer: Medicaid Other

## 2019-10-07 ENCOUNTER — Other Ambulatory Visit: Payer: Self-pay

## 2019-10-07 ENCOUNTER — Ambulatory Visit
Admission: RE | Admit: 2019-10-07 | Discharge: 2019-10-07 | Disposition: A | Discharge status: Home / Self Care | Payer: Medicaid Other | Source: Ambulatory Visit | Attending: Orthopedic Surgery | Admitting: Orthopedic Surgery

## 2019-10-07 DIAGNOSIS — M25562 Pain in left knee: Secondary | ICD-10-CM

## 2019-10-13 NOTE — Progress Notes (Signed)
Cardiology Office Note  Date:  10/15/2019   ID:  [name], DOB [birth year], MRN [id number]  PCP:  Trinna Post, PA-C   Chief Complaint  Patient presents with  . Other    12 month follow up. Patient denies chest pain and SOB a this time. Meds reviewed verbally with patients.     HPI:  [name] is a  48 year old gentleman with  morbid obesity, runs a local restaurant,  severe stress, long work hours,   Prior hx of chest pain and shortness of breath.  Non-smoker, no diabetes CT scan abdomen pelvis no aortic atherosclerosis He presents for f/u of his  chest pain symptoms  Stress, from his own medical issues, family medical issues, family stress, restaurant stress  Ankle problems, has had surgery bilateral ankles Son with arthritis, seeing specialist Business slow at restaurant  CT renal study: images reviewed No aortic athero,  No iliac  athero  HBA1C 5.4 up to 5.7 Father passed away Was able to travel back to [place] to see him  Total chol 188, LDL 127 Wife takes care of 8 children,   Kids 5 1/2 to 50 yo  Bone spur, cast on right foot  EKG personally reviewed by myself on todays visit  shows sinus rhythm and RBBB, rate 76 bpm  Family history Reports that his mother died of MS, father still alive, no coronary disease Father died early 25s  Previous records reviewed stress test 2 years ago, 08/2014, no ischemia Echo 11/2014 [facility]  cardiac function    PMH:   has a past medical history of Carpal tunnel syndrome, Complication of anesthesia (1996), Family history of adverse reaction to anesthesia, Heart murmur, Hyperlipidemia, Mitral valve prolapse, Personal history of colonic polyps = adenoma (01/24/2012), Rectal bleeding, Right bundle branch block, and Sleep apnea.  PSH:    Past Surgical History:  Procedure Laterality Date  . ACHILLES TENDON SURGERY Right 06/12/2018   Procedure: RIGHT ACHILLES TENDON REPAIR AND EXCISION OF HEEL SPUR;  Surgeon:  Melrose Nakayama, MD;  Location: [place];  Service: Orthopedics;  Laterality: Right;  . CHONDROPLASTY  02/27/2012   Procedure: CHONDROPLASTY;  Surgeon: Alta Corning, MD;  Location: [place];  Service: Orthopedics;  Laterality: Right;  patella-femoral joint  . CIRCUMCISION N/A 07/10/2018   Procedure: CIRCUMCISION ADULT;  Surgeon: Abbie Sons, MD;  Location: [facility];  Service: Urology;  Laterality: N/A;  MAC with local  . COLONOSCOPY    . EYE SURGERY    . HEEL SPUR SURGERY Bilateral 2016  . KNEE ARTHROSCOPY Left 3/16  . KNEE ARTHROSCOPY Left 03/10/2017   Procedure: ARTHROSCOPY KNEE;  Surgeon: Dorna Leitz, MD;  Location: [place];  Service: Orthopedics;  Laterality: Left;  . LASIK  2006   bilateral  . NASAL TURBINATE REDUCTION  1996  . VASECTOMY      Current Outpatient Medications  Medication Sig Dispense Refill  . acetaminophen (TYLENOL 8 HOUR ARTHRITIS PAIN) 650 MG CR tablet Take 650 mg by mouth every 8 (eight) hours as needed for pain.    . Vitamin D, Ergocalciferol, (DRISDOL) 1.25 MG (50000 UT) CAPS capsule Take 1 capsule (50,000 Units total) by mouth every 7 (seven) days. 8 capsule 0  . ibuprofen (ADVIL) 200 MG tablet Take by mouth.     Current Facility-Administered Medications  Medication Dose Route Frequency Provider Last Rate Last Dose  . ipratropium-albuterol (DUONEB) 0.5-2.5 (3) MG/3ML nebulizer solution 3 mL  3 mL Nebulization Once Trinna Post, PA-C  Allergies:   Patient has no known allergies.   Social History:  The patient  reports that he has never smoked. He has never used smokeless tobacco. He reports previous alcohol use. He reports that he does not use drugs.   Family History:   family history includes Anesthesia problems in his mother; Arthritis in his son; Asthma in an other family member; Colitis in his father; Hypertension in his mother; Lung cancer in his father, paternal grandfather, and paternal uncle; Multiple sclerosis in his mother; Pulmonary  fibrosis in his father.    Review of Systems: Review of Systems  Constitutional: Negative.   Respiratory: Negative.   Cardiovascular: Negative.   Gastrointestinal: Negative.   Musculoskeletal: Positive for joint pain.  Neurological: Negative.   Psychiatric/Behavioral: Negative.   All other systems reviewed and are negative.    PHYSICAL EXAM: VS:  BP 122/86 (BP Location: Left Arm, Patient Position: Sitting, Cuff Size: Large)   Pulse 76   Ht _0  (1.93 m)   Wt (!) 315 lb (142.9 kg)   BMI 38.34 kg/m  , BMI Body mass index is 38.34 kg/m. Constitutional:  oriented to person, place, and time. No distress.  HENT:  Head: Grossly normal Eyes:  no discharge. No scleral icterus.  Neck: No JVD, no carotid bruits  Cardiovascular: Regular rate and rhythm, no murmurs appreciated Pulmonary/Chest: Clear to auscultation bilaterally, no wheezes or rails Abdominal: Soft.  no distension.  no tenderness.  Musculoskeletal: Normal range of motion Neurological:  normal muscle tone. Coordination normal. No atrophy Skin: Skin warm and dry Psychiatric: normal affect, pleasant  Recent Labs: 08/27/2019: ALT 36; BUN 19; Creatinine, Ser 1.10; Hemoglobin 15.1; Platelets 256; Potassium 4.4; Sodium 140    Lipid Panel Lab Results  Component Value Date   CHOL 188 08/27/2019   HDL 41 08/27/2019   LDLCALC 127 (H) 08/27/2019   TRIG 111 08/27/2019      Wt Readings from Last 3 Encounters:  10/15/19 (!) 315 lb (142.9 kg)  08/27/19 (!) 317 lb (143.8 kg)  01/02/19 (!) 311 lb (141.1 kg)       ASSESSMENT AND PLAN:  Pure hypercholesterolemia Stable numbers, Suggested walking, diet, for weight loss CT reviewed, no aorttic athero  Morbid obesity ([place]) We have encouraged continued exercise, careful diet management in an effort to lose weight.  Still with occasional soda, high carbohydrate  Shortness of breath Stable symptoms, likely related to deconditioning and weight Unable to walk very much  secondary to chronic ankle pain bilaterally Would need low impact exercises  Anxiety Lots of stress at home, 8 children Discussed recent stressors, loss of his father, children with medical illness.  His own ankle problems   Total encounter time more than 25 minutes  Greater than 50% was spent in counseling and coordination of care with the patient   Disposition:   F/U 12 months   Orders Placed This Encounter  Procedures  . EKG 12-Lead     Signed, Esmond Plants, M.D., Ph.D. 10/15/2019  [place], [place]

## 2019-10-15 ENCOUNTER — Encounter: Payer: Self-pay | Admitting: Cardiovascular Disease

## 2019-10-15 ENCOUNTER — Other Ambulatory Visit: Payer: Self-pay

## 2019-10-15 ENCOUNTER — Ambulatory Visit (INDEPENDENT_AMBULATORY_CARE_PROVIDER_SITE_OTHER): Payer: Medicaid Other | Admitting: Cardiovascular Disease

## 2019-10-15 DIAGNOSIS — E7800 Pure hypercholesterolemia, unspecified: Secondary | ICD-10-CM

## 2019-10-15 DIAGNOSIS — F419 Anxiety disorder, unspecified: Secondary | ICD-10-CM

## 2019-10-15 NOTE — Patient Instructions (Signed)

## 2019-10-18 ENCOUNTER — Other Ambulatory Visit: Payer: Self-pay | Admitting: Physician Assistant

## 2019-10-18 DIAGNOSIS — R059 Cough, unspecified: Secondary | ICD-10-CM

## 2019-10-18 DIAGNOSIS — J40 Bronchitis, not specified as acute or chronic: Secondary | ICD-10-CM

## 2019-10-18 DIAGNOSIS — J4540 Moderate persistent asthma, uncomplicated: Secondary | ICD-10-CM

## 2019-10-18 DIAGNOSIS — R05 Cough: Secondary | ICD-10-CM

## 2019-10-21 ENCOUNTER — Encounter: Payer: Self-pay | Admitting: Physician Assistant

## 2019-10-21 ENCOUNTER — Telehealth: Payer: Self-pay | Admitting: Physician Assistant

## 2019-10-21 NOTE — Telephone Encounter (Signed)
Pt would not disclose the issues he is currently having with his medication Adriana prescribed.  Needing a call back to discuss at [phone].  Thanks, [employer]

## 2019-10-21 NOTE — Telephone Encounter (Signed)
Patient states that he is bronchitis has flared up again and he is needed a refill on his Duoneb and QVar inhaler. Patient was advised that he may need office visit but states that Fabio Bering has filled it for him before.Please advise.

## 2019-10-22 ENCOUNTER — Ambulatory Visit (INDEPENDENT_AMBULATORY_CARE_PROVIDER_SITE_OTHER): Payer: Medicaid Other | Admitting: Physician Assistant

## 2019-10-22 ENCOUNTER — Ambulatory Visit
Admission: RE | Admit: 2019-10-22 | Discharge: 2019-10-22 | Disposition: A | Discharge status: Home / Self Care | Payer: Medicaid Other | Source: Ambulatory Visit | Attending: Physician Assistant | Admitting: Physician Assistant

## 2019-10-22 ENCOUNTER — Telehealth: Payer: Self-pay | Admitting: Physician Assistant

## 2019-10-22 ENCOUNTER — Encounter: Payer: Self-pay | Admitting: Physician Assistant

## 2019-10-22 DIAGNOSIS — U071 COVID-19: Secondary | ICD-10-CM

## 2019-10-22 DIAGNOSIS — R05 Cough: Secondary | ICD-10-CM | POA: Diagnosis present

## 2019-10-22 DIAGNOSIS — R059 Cough, unspecified: Secondary | ICD-10-CM

## 2019-10-22 MED ORDER — PREDNISONE 20 MG PO TABS: 20.0000 mg | ORAL_TABLET | Freq: Every day | ORAL | 0 refills | Status: AC

## 2019-10-22 MED ORDER — PROMETHAZINE-DM 6.25-15 MG/5ML PO SYRP: 5.0000 mL | ORAL_SOLUTION | Freq: Every evening | ORAL | 0 refills | Status: DC | PRN

## 2019-10-22 MED ORDER — IPRATROPIUM-ALBUTEROL 0.5-2.5 (3) MG/3ML IN SOLN: 3.0000 mL | RESPIRATORY_TRACT | 0 refills | Status: DC | PRN

## 2019-10-22 NOTE — Telephone Encounter (Signed)
Pt calling to receive Xray results.  Wants to start treatment asap   Please call pt back to let him know.  Thanks, [employer]

## 2019-10-22 NOTE — Telephone Encounter (Signed)
Not back, will call when they are.

## 2019-10-22 NOTE — Telephone Encounter (Signed)
Office visit, virtual.

## 2019-10-22 NOTE — Progress Notes (Signed)
Patient: [name] Male    DOB: 1971-02-20   48 y.o.   MRN: [id number] Visit Date: 10/22/2019  Today's Provider: Trinna Post, PA-C   Chief Complaint  Patient presents with  . URI   Subjective:     Virtual Visit via Telephone Note  I connected with [name] on 10/22/19 at 10:00 AM EST by telephone and verified that I am speaking with the correct person using two identifiers.  Location: Patient: Home Provider: Office   I discussed the limitations, risks, security and privacy concerns of performing an evaluation and management service by telephone and the availability of in person appointments. I also discussed with the patient that there may be a patient responsible charge related to this service. The patient expressed understanding and agreed to proceed.  Patient reports feeling symptomatic on 10/19/2019 with post nasal drip and sore throat. He was tested at [facility] on Saturday morning and was positive for COVID. He is taking QVAR and duoneb that he had leftover from a previous episode with bronchitis. He reports his oxygen dropped down to 85%. Ambulance was called - EMT reports they heard crackling in his lungs and ended up going to [facility] ER. Reports he was not seen as he was told by the nurse he would likely not be admitted. Reports he had a fever of 103F.   Reports his cough is getting worse. Reports he has done a duoneb treatment and QVAR.  URI  The maximum temperature recorded prior to his arrival was 103 - 104 F. Associated symptoms include congestion, coughing and headaches. Pertinent negatives include no sinus pain or sore throat. He has tried inhaler use for the symptoms.  Patient oxygen level dropped below 90 to 85.   No Known Allergies   Current Outpatient Medications:  .  acetaminophen (TYLENOL 8 HOUR ARTHRITIS PAIN) 650 MG CR tablet, Take 650 mg by mouth every 8 (eight) hours as needed for pain., Disp: , Rfl:  .  ibuprofen (ADVIL) 200 MG tablet,  Take by mouth., Disp: , Rfl:  .  Vitamin D, Ergocalciferol, (DRISDOL) 1.25 MG (50000 UT) CAPS capsule, Take 1 capsule (50,000 Units total) by mouth every 7 (seven) days., Disp: 8 capsule, Rfl: 0  Current Facility-Administered Medications:  .  ipratropium-albuterol (DUONEB) 0.5-2.5 (3) MG/3ML nebulizer solution 3 mL, 3 mL, Nebulization, Once, [place], Adriana M, PA-C  Review of Systems  Constitutional: Positive for appetite change, chills, fatigue and fever.  HENT: Positive for congestion and postnasal drip. Negative for sinus pressure, sinus pain and sore throat.   Respiratory: Positive for cough and shortness of breath.   Neurological: Positive for dizziness, light-headedness and headaches.    Social History   Tobacco Use  . Smoking status: Never Smoker  . Smokeless tobacco: Never Used  Substance Use Topics  . Alcohol use: Not Currently    Alcohol/week: 0.0 standard drinks    Frequency: Never    Comment: occasional beer/wine      Objective:   There were no vitals taken for this visit. There were no vitals filed for this visit.There is no height or weight on file to calculate BMI.   Physical Exam   No results found for any visits on 10/22/19.     Assessment & Plan    1. COVID-19  Counseled about treatment for covid. Should continue duoneb and QVAR. Counseled steroids have uncertain benefit in outpatients with mild illness but I will send some in case of worsening. CXR  ordered and plan to give abx if it appears there is pneumonia. Counseled on return precautions.   - ipratropium-albuterol (DUONEB) 0.5-2.5 (3) MG/3ML SOLN; Take 3 mLs by nebulization every 4 (four) hours as needed.  Dispense: 360 mL; Refill: 0 - DG Chest 2 View; Future - predniSONE (DELTASONE) 20 MG tablet; Take 1 tablet (20 mg total) by mouth daily with breakfast for 5 days.  Dispense: 5 tablet; Refill: 0 - MyChart COVID-19 home monitoring program; Future - Temperature monitoring; Future  2. Cough  -  ipratropium-albuterol (DUONEB) 0.5-2.5 (3) MG/3ML SOLN; Take 3 mLs by nebulization every 4 (four) hours as needed.  Dispense: 360 mL; Refill: 0 - DG Chest 2 View; Future - promethazine-dextromethorphan (PROMETHAZINE-DM) 6.25-15 MG/5ML syrup; Take 5 mLs by mouth at bedtime as needed.  Dispense: 118 mL; Refill: 0 - predniSONE (DELTASONE) 20 MG tablet; Take 1 tablet (20 mg total) by mouth daily with breakfast for 5 days.  Dispense: 5 tablet; Refill: 0  I discussed the assessment and treatment plan with the patient. The patient was provided an opportunity to ask questions and all were answered. The patient agreed with the plan and demonstrated an understanding of the instructions.   The patient was advised to call back or seek an in-person evaluation if the symptoms worsen or if the condition fails to improve as anticipated.  I provided 25 minutes of non-face-to-face time during this encounter.  The entirety of the information documented in the History of Present Illness, Review of Systems and Physical Exam were personally obtained by me. Portions of this information were initially documented by [facility], CMA and reviewed by me for thoroughness and accuracy.          Trinna Post, PA-C  [facility]

## 2019-10-22 NOTE — Telephone Encounter (Signed)
Patient states that he reviewed results on mychart and states that it was normal. [name]

## 2019-10-22 NOTE — Telephone Encounter (Signed)
Patient had a telephone visit today 10/22/2019.

## 2019-10-23 ENCOUNTER — Encounter (INDEPENDENT_AMBULATORY_CARE_PROVIDER_SITE_OTHER): Payer: Self-pay

## 2019-10-24 ENCOUNTER — Encounter (INDEPENDENT_AMBULATORY_CARE_PROVIDER_SITE_OTHER): Payer: Self-pay

## 2019-10-24 ENCOUNTER — Telehealth: Payer: Self-pay

## 2019-10-24 NOTE — Telephone Encounter (Signed)
Patient has been advise on cough and sob per protocol:   Shortness of breath is the same: continue to monitor at home   If symptoms become severe, i.e. shortness of breath at rest, gasping for air, wheezing, CALL 911 AND SEEK TREATMENT IN THE ED   If cough remains the same or better: continue to treat with over the counter medications. Hard candy or cough drops and drinking warm fluids. Adults can also use honey 2 tsp (10 ML) at bedtime.   HONEY IS NOT RECOMMENDED FOR INFANTS UNDER ONE.   If cough is becoming worse even with the use of over the counter medications and patient is not able to sleep at night, cough becomes productive with sputum that maybe yellow or green in color, contact PCP.  Patient states that when he has a coughing spell he gets sob. Patient verbalized understanding and agrees with plan.

## 2019-10-28 ENCOUNTER — Encounter (INDEPENDENT_AMBULATORY_CARE_PROVIDER_SITE_OTHER): Payer: Medicaid Other | Admitting: Physician Assistant

## 2019-10-28 DIAGNOSIS — R05 Cough: Secondary | ICD-10-CM

## 2019-10-28 DIAGNOSIS — U071 COVID-19: Secondary | ICD-10-CM

## 2019-10-28 DIAGNOSIS — R059 Cough, unspecified: Secondary | ICD-10-CM

## 2019-10-28 NOTE — Telephone Encounter (Signed)
Requested medication (s) are due for refill today: no  Requested medication (s) are on the active medication list: no  Last refill:  01/02/2019.  Future visit scheduled: no  Notes to clinic: medications were discontinued    Requested Prescriptions  Pending Prescriptions Disp Refills   ipratropium-albuterol (DUONEB) 0.5-2.5 (3) MG/3ML SOLN [Pharmacy Med Name: IPRATROPI/ALB 0.5/3MG  INH SL 60X3ML] 360 mL 0    Sig: USE 1 VIAL VIA NEBULIZER EVERY 6 HOURS AS NEEDED     Pulmonology:  Combination Products Passed - 10/28/2019 11:45 AM      Passed - Valid encounter within last 12 months    Recent Outpatient Visits          6 days ago [place], Wendee Beavers, [place]   2 months ago Annual physical exam   [place], [place], [place]   3 months ago Dehydration   [place], Vickki Muff, [place]   9 months ago [place], [place], [place]   10 months ago Prediabetes   [place], [place], PA-C              QVAR REDIHALER 40 MCG/ACT inhaler [Pharmacy Med Name: QVAR REDIHALER 40MCG ORALINH (120)] 10.6 g 0    Sig: INHALE 1 PUFF BY MOUTH TWICE DAILY     Pulmonology:  Corticosteroids Passed - 10/28/2019 11:45 AM      Passed - Valid encounter within last 12 months    Recent Outpatient Visits          6 days ago [place], Wendee Beavers, [place]   2 months ago Annual physical exam   [place], [place], [place]   3 months ago Dehydration   [employer], Vickki Muff, [place]   9 months ago [place], [place], [place]   10 months ago [place], [place], [place]

## 2019-10-29 MED ORDER — PROMETHAZINE-DM 6.25-15 MG/5ML PO SYRP: 5.0000 mL | ORAL_SOLUTION | Freq: Every evening | ORAL | 0 refills | Status: DC | PRN

## 2019-10-29 MED ORDER — AZITHROMYCIN 250 MG PO TABS: ORAL_TABLET | ORAL | 0 refills | Status: DC

## 2019-10-29 MED ORDER — PREDNISONE 20 MG PO TABS: 20.0000 mg | ORAL_TABLET | Freq: Every day | ORAL | 0 refills | Status: AC

## 2019-10-29 NOTE — Telephone Encounter (Signed)
Subjective:   Patient ID: [name], male    DOB: Apr 04, 1971, 48 y.o.   MRN: [id number]  [name] is a 48 y.o. male presenting on 10/28/2019 for COVID-19  Virtual Visit via [facility]  I connected with Herminio Commons by MyChart and verified that I am speaking with the correct person using two identifiers.   I discussed the limitations, risks, security and privacy concerns of performing an evaluation and management service by telephone and the availability of in person appointments. I also discussed with the patient that there may be a patient responsible charge related to this service. The patient expressed understanding and agreed to proceed.   HPI  Patient is a 48 y/o obese man who tested positive for COVID on 10/19/2019 presenting today for follow up. He initially presented to this clinic on 10/22/2019 virtually after COVID diagnosis. He was given duoneb treatments and prednisone 20 mg x 5 days. CXR from 10/22/2019 did not show active cardiopulmonary disease. Patient's wife contacted clinic on 10/25/2019 to report continued fever and persistent cough. She took patient to [facility] ER where his pulse ox was 93% initially but 98% on discharge and his temperature was 102.53F. His pulse was 96 and BP 122/74. RR 25. Lungs were clear to examination.  EKG was NSR with RBBB and no signs of ischemia. CXR was normal and CTA showed grounglass opacities consistent with COVID. He was discharged home with tylenol and motrin.   Today patient reports he is still having cough, fatigue and fever. He is requesting a refill of the cough medication and abx + prednisone.   Social History   Tobacco Use  . Smoking status: Never Smoker  . Smokeless tobacco: Never Used  Substance Use Topics  . Alcohol use: Not Currently    Alcohol/week: 0.0 standard drinks    Frequency: Never    Comment: occasional beer/wine  . Drug use: No    Per HPI unless specifically indicated above  Objective:   Wt Readings  from Last 3 Encounters:  10/15/19 (!) 315 lb (142.9 kg)  08/27/19 (!) 317 lb (143.8 kg)  01/02/19 (!) 311 lb (141.1 kg)   Physical Exam Results for orders placed or performed in visit on 08/27/19  Vitamin D (25 hydroxy)  Result Value Ref Range   Vit D, 25-Hydroxy 18.6 (L) 30.0 - 100.0 ng/mL  Comprehensive Metabolic Panel (CMET)  Result Value Ref Range   Glucose 97 65 - 99 mg/dL   BUN 19 6 - 24 mg/dL   Creatinine, Ser 1.10 0.76 - 1.27 mg/dL   GFR calc non Af Amer 80 >59 mL/min/1.73   GFR calc Af Amer 93 >59 mL/min/1.73   BUN/Creatinine Ratio 17 9 - 20   Sodium 140 134 - 144 mmol/L   Potassium 4.4 3.5 - 5.2 mmol/L   Chloride 101 96 - 106 mmol/L   CO2 23 20 - 29 mmol/L   Calcium 9.2 8.7 - 10.2 mg/dL   Total Protein 7.0 6.0 - 8.5 g/dL   Albumin 4.6 4.0 - 5.0 g/dL   Globulin, Total 2.4 1.5 - 4.5 g/dL   Albumin/Globulin Ratio 1.9 1.2 - 2.2   Bilirubin Total 0.5 0.0 - 1.2 mg/dL   Alkaline Phosphatase 58 39 - 117 IU/L   AST 21 0 - 40 IU/L   ALT 36 0 - 44 IU/L  HgB A1c  Result Value Ref Range   Hgb A1c MFr Bld 5.7 (H) 4.8 - 5.6 %   Est. average glucose Bld gHb Est-mCnc 117  mg/dL  Lipid Profile  Result Value Ref Range   Cholesterol, Total 188 100 - 199 mg/dL   Triglycerides 111 0 - 149 mg/dL   HDL 41 >39 mg/dL   VLDL Cholesterol Cal 20 5 - 40 mg/dL   LDL Chol Calc (NIH) 127 (H) 0 - 99 mg/dL   Chol/HDL Ratio 4.6 0.0 - 5.0 ratio  CBC with Differential  Result Value Ref Range   WBC 6.1 3.4 - 10.8 x10E3/uL   RBC 4.90 4.14 - 5.80 x10E6/uL   Hemoglobin 15.1 13.0 - 17.7 g/dL   Hematocrit 42.9 37.5 - 51.0 %   MCV 88 79 - 97 fL   MCH 30.8 26.6 - 33.0 pg   MCHC 35.2 31.5 - 35.7 g/dL   RDW 12.4 11.6 - 15.4 %   Platelets 256 150 - 450 x10E3/uL   Neutrophils 56 Not Estab. %   Lymphs 31 Not Estab. %   Monocytes 8 Not Estab. %   Eos 3 Not Estab. %   Basos 1 Not Estab. %   Neutrophils Absolute 3.5 1.4 - 7.0 x10E3/uL   Lymphocytes Absolute 1.9 0.7 - 3.1 x10E3/uL   Monocytes Absolute  0.5 0.1 - 0.9 x10E3/uL   EOS (ABSOLUTE) 0.2 0.0 - 0.4 x10E3/uL   Basophils Absolute 0.1 0.0 - 0.2 x10E3/uL   Immature Granulocytes 1 Not Estab. %   Immature Grans (Abs) 0.0 0.0 - 0.1 x10E3/uL    Assessment & Plan:  1. COVID-19  Have counseled patient that antibiotics and prednisone have undetermined clinical significant in the setting out outpatients with COVID and without superimposed bacterial infections. These treatments are not substitute for in person re-evaluation if symptoms worsen. Return precautions counseled. Continue duoneb and fever reducer.   2. Cough  - promethazine-dextromethorphan (PROMETHAZINE-DM) 6.25-15 MG/5ML syrup; Take 5 mLs by mouth at bedtime as needed.  Dispense: 118 mL; Refill: 0 - azithromycin (ZITHROMAX) 250 MG tablet; Take 2 pills on day 1, and then one pill each of the following four days.  Dispense: 6 tablet; Refill: 0 - predniSONE (DELTASONE) 20 MG tablet; Take 1 tablet (20 mg total) by mouth daily with breakfast for 5 days.  Dispense: 5 tablet; Refill: [place], PA-C [facility] 10/29/2019, 4:34 PM

## 2019-11-12 ENCOUNTER — Encounter: Payer: Self-pay | Admitting: Physician Assistant

## 2020-01-05 ENCOUNTER — Encounter: Payer: Self-pay | Admitting: Physician Assistant

## 2020-01-06 NOTE — Telephone Encounter (Signed)
Please schedule appointment.

## 2020-01-07 NOTE — Telephone Encounter (Signed)
Appointment was scheduled.

## 2020-01-10 ENCOUNTER — Other Ambulatory Visit: Payer: Self-pay

## 2020-01-10 ENCOUNTER — Encounter: Payer: Self-pay | Admitting: Physician Assistant

## 2020-01-10 ENCOUNTER — Ambulatory Visit: Payer: Medicaid Other | Admitting: Physician Assistant

## 2020-01-10 VITALS — BP 125/85 | HR 66 | Temp 97.1°F | Wt 325.4 lb

## 2020-01-10 DIAGNOSIS — G4733 Obstructive sleep apnea (adult) (pediatric): Secondary | ICD-10-CM | POA: Insufficient documentation

## 2020-01-10 DIAGNOSIS — R5383 Other fatigue: Secondary | ICD-10-CM

## 2020-01-10 DIAGNOSIS — R7989 Other specified abnormal findings of blood chemistry: Secondary | ICD-10-CM | POA: Diagnosis not present

## 2020-01-10 DIAGNOSIS — R7303 Prediabetes: Secondary | ICD-10-CM | POA: Diagnosis not present

## 2020-01-10 NOTE — Progress Notes (Signed)
Patient: [name] Male    DOB: 1971-04-26   49 y.o.   MRN: [id number] Visit Date: 01/10/2020  Today's Provider: Trinna Post, PA-C   Chief Complaint  Patient presents with  . Fatigue   Subjective:     HPI   Fatigue Patient presents today for fatigue. Patient states that he only sleeps like 5 or 6 hours a night. Patient states that he has a CPAP machine but has not used it in months. Reports he does not have the tank for the machine. He will feel poorly rested and is prone to dozing throughout the day.    No Known Allergies   Current Outpatient Medications:  .  acetaminophen (TYLENOL 8 HOUR ARTHRITIS PAIN) 650 MG CR tablet, Take 650 mg by mouth every 8 (eight) hours as needed for pain., Disp: , Rfl:  .  ibuprofen (ADVIL) 200 MG tablet, Take by mouth., Disp: , Rfl:  .  ipratropium-albuterol (DUONEB) 0.5-2.5 (3) MG/3ML SOLN, USE 1 VIAL VIA NEBULIZER EVERY 6 HOURS AS NEEDED, Disp: 360 mL, Rfl: 0 .  QVAR REDIHALER 40 MCG/ACT inhaler, INHALE 1 PUFF BY MOUTH TWICE DAILY, Disp: 10.6 g, Rfl: 0 .  azithromycin (ZITHROMAX) 250 MG tablet, Take 2 pills on day 1, and then one pill each of the following four days. (Patient not taking: Reported on 01/10/2020), Disp: 6 tablet, Rfl: 0 .  promethazine-dextromethorphan (PROMETHAZINE-DM) 6.25-15 MG/5ML syrup, Take 5 mLs by mouth at bedtime as needed. (Patient not taking: Reported on 01/10/2020), Disp: 118 mL, Rfl: 0  Review of Systems  Constitutional: Positive for fatigue.    Social History   Tobacco Use  . Smoking status: Never Smoker  . Smokeless tobacco: Never Used  Substance Use Topics  . Alcohol use: Not Currently    Alcohol/week: 0.0 standard drinks    Comment: occasional beer/wine      Objective:   BP 125/85 (BP Location: Left Arm, Patient Position: Sitting, Cuff Size: Large)   Pulse 66   Temp (!) 97.1 F (36.2 C) (Temporal)   Wt (!) 325 lb 6.4 oz (147.6 kg)   BMI 39.61 kg/m  Vitals:   01/10/20 0847  BP:  125/85  Pulse: 66  Temp: (!) 97.1 F (36.2 C)  TempSrc: Temporal  Weight: (!) 325 lb 6.4 oz (147.6 kg)  Body mass index is 39.61 kg/m.   Physical Exam Constitutional:      Appearance: Normal appearance. He is obese.  Cardiovascular:     Rate and Rhythm: Normal rate and regular rhythm.     Heart sounds: Normal heart sounds.  Pulmonary:     Effort: Pulmonary effort is normal.     Breath sounds: Normal breath sounds.  Skin:    General: Skin is warm and dry.  Neurological:     Mental Status: He is alert and oriented to person, place, and time. Mental status is at baseline.  Psychiatric:        Mood and Affect: Mood normal.        Behavior: Behavior normal.      No results found for any visits on 01/10/20.     Assessment & Plan    1. Obstructive sleep apnea  Advised to call CPAP company and see if he has refills on parts or if he needs a new script.    2. Low vitamin D level   3. Prediabetes  - Lipid Profile - HgB A1c  4. Other fatigue  - Comprehensive Metabolic  Panel (CMET) - CBC with Differential - TSH  The entirety of the information documented in the History of Present Illness, Review of Systems and Physical Exam were personally obtained by me. Portions of this information were initially documented by [facility] and reviewed by me for thoroughness and accuracy.      Trinna Post, PA-C  [facility]

## 2020-01-10 NOTE — Patient Instructions (Signed)

## 2020-01-11 ENCOUNTER — Encounter: Payer: Self-pay | Admitting: Physician Assistant

## 2020-01-11 LAB — LIPID PANEL
Chol/HDL Ratio: 4.4 ratio (ref 0.0–5.0)
Cholesterol, Total: 197 mg/dL (ref 100–199)
HDL: 45 mg/dL (ref >39)
LDL Chol Calc (NIH): 128 mg/dL — ABNORMAL HIGH (ref 0–99)
Triglycerides: 133 mg/dL (ref 0–149)
VLDL Cholesterol Cal: 24 mg/dL (ref 5–40)

## 2020-01-11 LAB — CBC WITH DIFFERENTIAL/PLATELET
Basophils Absolute: 0.1 10*3/uL (ref 0.0–0.2)
Basos: 1 %
EOS (ABSOLUTE): 0.2 10*3/uL (ref 0.0–0.4)
Eos: 3 %
Hematocrit: 43.4 % (ref 37.5–51.0)
Hemoglobin: 14.7 g/dL (ref 13.0–17.7)
Immature Grans (Abs): 0 10*3/uL (ref 0.0–0.1)
Immature Granulocytes: 0 %
Lymphocytes Absolute: 1.9 10*3/uL (ref 0.7–3.1)
Lymphs: 33 %
MCH: 30.9 pg (ref 26.6–33.0)
MCHC: 33.9 g/dL (ref 31.5–35.7)
MCV: 91 fL (ref 79–97)
Monocytes Absolute: 0.5 10*3/uL (ref 0.1–0.9)
Monocytes: 9 %
Neutrophils Absolute: 3.1 10*3/uL (ref 1.4–7.0)
Neutrophils: 54 %
Platelets: 256 10*3/uL (ref 150–450)
RBC: 4.76 x10E6/uL (ref 4.14–5.80)
RDW: 13.1 % (ref 11.6–15.4)
WBC: 5.8 10*3/uL (ref 3.4–10.8)

## 2020-01-11 LAB — COMPREHENSIVE METABOLIC PANEL
ALT: 38 IU/L (ref 0–44)
AST: 22 IU/L (ref 0–40)
Albumin/Globulin Ratio: 1.8 (ref 1.2–2.2)
Albumin: 4.6 g/dL (ref 4.0–5.0)
Alkaline Phosphatase: 62 IU/L (ref 39–117)
BUN/Creatinine Ratio: 18 (ref 9–20)
BUN: 18 mg/dL (ref 6–24)
Bilirubin Total: 0.5 mg/dL (ref 0.0–1.2)
CO2: 21 mmol/L (ref 20–29)
Calcium: 9.5 mg/dL (ref 8.7–10.2)
Chloride: 104 mmol/L (ref 96–106)
Creatinine, Ser: 0.98 mg/dL (ref 0.76–1.27)
GFR calc Af Amer: 106 mL/min/{1.73_m2} (ref >59)
GFR calc non Af Amer: 92 mL/min/{1.73_m2} (ref >59)
Globulin, Total: 2.5 g/dL (ref 1.5–4.5)
Glucose: 103 mg/dL — ABNORMAL HIGH (ref 65–99)
Potassium: 4.3 mmol/L (ref 3.5–5.2)
Sodium: 140 mmol/L (ref 134–144)
Total Protein: 7.1 g/dL (ref 6.0–8.5)

## 2020-01-11 LAB — HEMOGLOBIN A1C
Est. average glucose Bld gHb Est-mCnc: 114 mg/dL
Hgb A1c MFr Bld: 5.6 % (ref 4.8–5.6)

## 2020-01-11 LAB — TSH: TSH: 1.7 u[IU]/mL (ref 0.450–4.500)

## 2020-01-12 ENCOUNTER — Encounter: Payer: Self-pay | Admitting: Physician Assistant

## 2020-01-14 LAB — VITAMIN D 25 HYDROXY (VIT D DEFICIENCY, FRACTURES): Vit D, 25-Hydroxy: 15 ng/mL — ABNORMAL LOW (ref 30.0–100.0)

## 2020-01-14 LAB — SPECIMEN STATUS REPORT

## 2020-02-24 ENCOUNTER — Ambulatory Visit: Payer: Medicaid Other | Admitting: Internal Medicine

## 2020-02-24 ENCOUNTER — Ambulatory Visit (INDEPENDENT_AMBULATORY_CARE_PROVIDER_SITE_OTHER): Payer: Medicaid Other | Admitting: Internal Medicine

## 2020-02-24 ENCOUNTER — Other Ambulatory Visit: Payer: Self-pay

## 2020-02-24 ENCOUNTER — Encounter: Payer: Self-pay | Admitting: Internal Medicine

## 2020-02-24 DIAGNOSIS — R7303 Prediabetes: Secondary | ICD-10-CM | POA: Diagnosis not present

## 2020-02-24 DIAGNOSIS — E6609 Other obesity due to excess calories: Secondary | ICD-10-CM

## 2020-02-24 NOTE — Assessment & Plan Note (Signed)
Has been able to lose some weight with carb restrictions Discussed liraglutide---I would be willing to prescribe it if he stalls with his efforts

## 2020-02-24 NOTE — Assessment & Plan Note (Signed)
Mild elevated sugars Will consider liraglutide if he can't continue to lose weight with carb restriction, etc

## 2020-02-24 NOTE — Progress Notes (Signed)
Subjective:    Patient ID: [name], male    DOB: 11-17-1971, 49 y.o.   MRN: [id number]  HPI Here due to concerns about his elevated blood sugar This visit occurred during the SARS-CoV-2 public health emergency.  Safety protocols were in place, including screening questions prior to the visit, additional usage of staff PPE, and extensive cleaning of exam room while observing appropriate contact time as indicated for disinfecting solutions.   Reviewed recent labs Glucose 103/104 A1c borderline at 5.6%  Has been working on his eating Cut out carbs--only whole wheat pasta, etc Lots of fish, salmon, watching portion control  Interested in liraglutide again--I had prescribed it in the past  Current Outpatient Medications on File Prior to Visit  Medication Sig Dispense Refill  . acetaminophen (TYLENOL 8 HOUR ARTHRITIS PAIN) 650 MG CR tablet Take 650 mg by mouth every 8 (eight) hours as needed for pain.     No current facility-administered medications on file prior to visit.    No Known Allergies  Past Medical History:  Diagnosis Date  . Carpal tunnel syndrome    bileteral - "Minor"  . Complication of anesthesia 1996   ran a fever after sinus surgery.  In [place].  . Family history of adverse reaction to anesthesia    MOM-UNSURE WHAT HAPPENED WITH HIS MOM  . Heart murmur   . Hyperlipidemia   . Mitral valve prolapse   . Personal history of colonic polyps = adenoma 01/24/2012   5 mm adenoma removed 01/2012 Carlean Purl   . Rectal bleeding    polyp removed 01/19/2012 pre canerous  . Right bundle branch block   . Sleep apnea    USES CPAP    Past Surgical History:  Procedure Laterality Date  . ACHILLES TENDON SURGERY Right 06/12/2018   Procedure: RIGHT ACHILLES TENDON REPAIR AND EXCISION OF HEEL SPUR;  Surgeon: Melrose Nakayama, MD;  Location: [facility];  Service: Orthopedics;  Laterality: Right;  . CHONDROPLASTY  02/27/2012   Procedure: CHONDROPLASTY;  Surgeon: Alta Corning, MD;   Location: [facility];  Service: Orthopedics;  Laterality: Right;  patella-femoral joint  . CIRCUMCISION N/A 07/10/2018   Procedure: CIRCUMCISION ADULT;  Surgeon: Abbie Sons, MD;  Location: [facility];  Service: Urology;  Laterality: N/A;  MAC with local  . COLONOSCOPY    . EYE SURGERY    . HEEL SPUR SURGERY Bilateral 2016  . KNEE ARTHROSCOPY Left 3/16  . KNEE ARTHROSCOPY Left 03/10/2017   Procedure: ARTHROSCOPY KNEE;  Surgeon: Dorna Leitz, MD;  Location: [facility];  Service: Orthopedics;  Laterality: Left;  . LASIK  2006   bilateral  . NASAL TURBINATE REDUCTION  1996  . VASECTOMY      Family History  Problem Relation Age of Onset  . Multiple sclerosis Mother   . Anesthesia problems Mother   . Hypertension Mother   . Colitis Father        questionable  . Lung cancer Father   . Pulmonary fibrosis Father        stage 4  . Asthma Other        children; seasonal  . Arthritis Son        juvinile rheumatoid  . Lung cancer Paternal Grandfather   . Lung cancer Paternal Uncle   . Colon cancer Neg Hx     Social History   Socioeconomic History  . Marital status: Married    Spouse name: Not on file  . Number of children: 8  .  Years of education: 11  . Highest education level: Not on file  Occupational History  . 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 Smoking status: Never Smoker  . Smokeless tobacco: Never Used  Substance and Sexual Activity  . Alcohol use: Not Currently    Alcohol/week: 0.0 standard drinks    Comment: occasional beer/wine  . Drug use: No  . Sexual activity: Yes  Other Topics Concern  . 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   Social Determinants of Health   Financial Resource Strain:   . Difficulty of Paying Living Expenses:   Food Insecurity:   . Worried About Charity fundraiser in the Last Year:    . Arboriculturist in the Last Year:   Transportation Needs:   . Film/video editor (Medical):   Marland Kitchen Lack of Transportation (Non-Medical):   Physical Activity:   . Days of Exercise per Week:   . Minutes of Exercise per Session:   Stress:   . Feeling of Stress :   Social Connections:   . Frequency of Communication with Friends and Family:   . Frequency of Social Gatherings with Friends and Family:   . Attends Religious Services:   . Active Member of Clubs or Organizations:   . Attends Archivist Meetings:   Marland Kitchen Marital Status:   Intimate Partner Violence:   . Fear of Current or Ex-Partner:   . Emotionally Abused:   Marland Kitchen Physically Abused:   . Sexually Abused:    Review of Systems Went to dermatologist due to rash on calves Referred to rheumatologist ---considering Rx for MTX (concerned about psoriatic arthritis) Past diagnosis of sleep apnea---not using CPAP lately (less somnolence since on decreased carbs)    Objective:   Physical Exam  Constitutional: No distress.  Psychiatric: He has a normal mood and affect. His behavior is normal.           Assessment & Plan:

## 2020-03-03 ENCOUNTER — Telehealth: Payer: Self-pay

## 2020-03-03 NOTE — Telephone Encounter (Signed)
Patient has [employer] and [facility] is on the card.  I have reviewed this with billing and have requested adjustment with claim correction to correct the dx code placing prediabetes R73.03 FIRST and Obesity e66.09 second for the 02/24/20 office visit.   Claim correction is to correct and send for re-file with insurance. I have let patient know that we are working on correcting this and he is to let me know if he receives any further bill.

## 2020-03-24 MED ORDER — SAXENDA 18 MG/3ML ~~LOC~~ SOPN: 1.2000 mg | PEN_INJECTOR | Freq: Every day | SUBCUTANEOUS | 3 refills | Status: DC

## 2020-03-24 NOTE — Telephone Encounter (Signed)
PA started on Cover My Meds: Your information has been submitted to [employer]. [employer] is reviewing the PA request and you will receive an electronic response. You may check for the updated outcome later by reopening this request. The standard fax determination will also be sent to you directly.  If you have any questions about your PA submission, contact [employer] at [phone].

## 2020-03-27 MED ORDER — INSULIN PEN NEEDLE 32G X 4 MM MISC: 3 refills | Status: DC

## 2020-03-27 NOTE — Addendum Note (Signed)
Addended by: Kris Mouton on: 03/27/2020 12:50 PM   Modules accepted: Orders

## 2020-06-04 MED ORDER — OZEMPIC (1 MG/DOSE) 2 MG/1.5ML ~~LOC~~ SOPN: 2.0000 mg | PEN_INJECTOR | SUBCUTANEOUS | 5 refills | Status: DC

## 2020-06-18 ENCOUNTER — Telehealth: Payer: Self-pay

## 2020-06-18 NOTE — Telephone Encounter (Signed)
Completed PA on CoverMyMeds. I believe it will be rejected due to the following questions: Has the patient tried and failed or been unable to tolerate Metformin (alone or in combination)? Has the patient tried and failed or been unable to tolerate another GLP-1 agonist product (e.g., Byetta/Bydureon, Trulicity or Victoza)? For this patient, would other covered alternatives for the same indication as Ozempic be less effective, have adverse events, or both?

## 2020-08-13 ENCOUNTER — Telehealth: Payer: Self-pay | Admitting: Internal Medicine

## 2020-08-13 NOTE — Telephone Encounter (Signed)
error 

## 2020-08-14 ENCOUNTER — Telehealth: Payer: Self-pay

## 2020-08-14 NOTE — Telephone Encounter (Signed)
[facility] - Client TELEPHONE ADVICE RECORD [facility] Patient Name: AMARIE TARTE Gender: Male DOB: 03/23/1971 Age: 49 Y 37 M 24 D Return Phone Number: [phone] (Primary) Address: City/State/Zip: [place] Client [facility] - Client Client Site [place] - Day Physician Viviana Simpler- MD Contact Type Call Who Is Calling Patient / Member / Family / Caregiver Call Type Triage / Clinical Relationship To Patient Self Return Phone Number [phone] (Primary) Chief Complaint Tingling Reason for Call Symptomatic / Request for [place] states is Alice w/Answering service - Pt is having extreme pain in his upper back, headaches and tingling in his fingers. Translation No Nurse Assessment Nurse: Claiborne Billings, RN, Kim Date/Time (Eastern Time): 08/13/2020 3:33:38 PM Confirm and document reason for call. If symptomatic, describe symptoms. ---Caller states he has had pain in his upper back, neck pain, headache and tingling in fingers on both hands. States pain got worse last night. States current level of back pain is 6-7/10, headache pain is 5-6/10. Has the patient had close contact with a person known or suspected to have the novel coronavirus illness OR traveled / lives in area with major community spread (including international travel) in the last 14 days from the onset of symptoms? * If Asymptomatic, screen for exposure and travel within the last 14 days. ---No Does the patient have any new or worsening symptoms? ---Yes Will a triage be completed? ---Yes Related visit to physician within the last 2 weeks? ---No Does the PT have any chronic conditions? (i.e. diabetes, asthma, this includes High risk factors for pregnancy, etc.) ---No Is this a behavioral health or substance abuse call? ---No Guidelines Guideline Title Affirmed Question Affirmed Notes Nurse Date/Time  (Eastern Time) Back Pain [1] Numbness in an arm or hand (i.e., loss of sensation) AND [2] upper back pain Claiborne Billings, RN, Maudie Mercury 08/13/2020 3:36:04 PM Disp. Time Eilene Ghazi Time) Disposition Final User PLEASE NOTE: All timestamps contained within this report are represented as [place] Standard Time. CONFIDENTIALTY NOTICE: This fax transmission is intended only for the addressee. It contains information that is legally privileged, confidential or otherwise protected from use or disclosure. If you are not the intended recipient, you are strictly prohibited from reviewing, disclosing, copying using or disseminating any of this information or taking any action in reliance on or regarding this information. If you have received this fax in error, please notify us immediately by telephone so that we can arrange for its return to [place]. Phone: [phone], Toll-Free: [phone], Fax: [phone] Page: 2 of 2 Call Id: [id number] 08/13/2020 3:38:37 PM See PCP within 24 Hours Yes Claiborne Billings, RN, Max Sane Disagree/Comply Comply Caller Understands Yes PreDisposition Did not know what to do Care Advice Given Per Guideline SEE PCP WITHIN 24 HOURS: * IF OFFICE WILL BE OPEN: You need to be examined within the next 24 hours. Call your doctor (or NP/PA) when the office opens and make an appointment. PAIN MEDICINES: * For pain relief, you can take either acetaminophen, ibuprofen, or naproxen. Referrals REFERRED TO PCP OFFICE

## 2020-08-14 NOTE — Telephone Encounter (Signed)
Unable to reach pt by phone. I spoke with pts wife (DPR signed) and there are no available appts at [facility] and pt will go to [facility] after a class he is presently in. FYI to Dr Silvio Pate.

## 2020-08-16 NOTE — Telephone Encounter (Signed)
I spoke to him briefly about his symptoms---hard to tell if muscular but pattern didn't seem radicular. Please check on him

## 2020-08-20 NOTE — Telephone Encounter (Signed)
Left message to call office to see how he was doing.

## 2020-08-31 ENCOUNTER — Encounter: Payer: Medicaid Other | Admitting: Internal Medicine

## 2020-09-10 ENCOUNTER — Ambulatory Visit: Payer: Self-pay | Admitting: Internal Medicine

## 2020-09-28 ENCOUNTER — Encounter: Payer: Self-pay | Admitting: Internal Medicine

## 2020-09-28 ENCOUNTER — Ambulatory Visit (INDEPENDENT_AMBULATORY_CARE_PROVIDER_SITE_OTHER): Payer: BLUE CROSS/BLUE SHIELD | Admitting: Internal Medicine

## 2020-09-28 ENCOUNTER — Other Ambulatory Visit: Payer: Self-pay

## 2020-09-28 VITALS — BP 110/76 | HR 64 | Temp 97.1°F | Ht 73.0 in | Wt 272.0 lb

## 2020-09-28 DIAGNOSIS — Z0000 Encounter for general adult medical examination without abnormal findings: Secondary | ICD-10-CM | POA: Diagnosis not present

## 2020-09-28 DIAGNOSIS — F39 Unspecified mood [affective] disorder: Secondary | ICD-10-CM

## 2020-09-28 DIAGNOSIS — Z23 Encounter for immunization: Secondary | ICD-10-CM | POA: Diagnosis not present

## 2020-09-28 DIAGNOSIS — R7303 Prediabetes: Secondary | ICD-10-CM | POA: Diagnosis not present

## 2020-09-28 NOTE — Assessment & Plan Note (Signed)
Doing well now Working on healthy lifestyle Flu vaccine today Colon due 2023 Consider PSA when 57

## 2020-09-28 NOTE — Assessment & Plan Note (Signed)
Mostly reactive depression and stress Some anxiety Doing well with the fluoxetine

## 2020-09-28 NOTE — Assessment & Plan Note (Signed)
Much better since being on wegovy

## 2020-09-28 NOTE — Addendum Note (Signed)
Addended by: Pilar Grammes on: 09/28/2020 05:01 PM   Modules accepted: Orders

## 2020-09-28 NOTE — Assessment & Plan Note (Signed)
Has lost 30# with wegovy Going to weight loss clinic titrating dose---now on 1mg 

## 2020-09-28 NOTE — Progress Notes (Signed)
Subjective:    Patient ID: [name], male    DOB: [birth year], 49 y.o.   MRN: [id number]  HPI Here for physical This visit occurred during the SARS-CoV-2 public health emergency.  Safety protocols were in place, including screening questions prior to the visit, additional usage of staff PPE, and extensive cleaning of exam room while observing appropriate contact time as indicated for disinfecting solutions.   Has been doing well with the semaglutide--from weight management program Had tough time after dad died--but now doing better Knees and other pain is better with 30# weight loss Has bought weight bench for house, etc Mood is better now---on the prozac  May have pulled some muscles in back Heat helps and it is improving  Current Outpatient Medications on File Prior to Visit  Medication Sig Dispense Refill  . acetaminophen (TYLENOL 8 HOUR ARTHRITIS PAIN) 650 MG CR tablet Take 650 mg by mouth every 8 (eight) hours as needed for pain.    Marland Kitchen FLUoxetine (PROZAC) 20 MG capsule Take 1 capsule by mouth daily.    . Insulin Pen Needle 32G X 4 MM MISC Use once daily with Saxenda injection 100 each 3  . Semaglutide-Weight Management 0.5 MG/0.5ML SOAJ Inject into the skin.     No current facility-administered medications on file prior to visit.    No Known Allergies  Past Medical History:  Diagnosis Date  . Carpal tunnel syndrome    bileteral - "Minor"  . Complication of anesthesia 1996   ran a fever after sinus surgery.  In [place].  . Family history of adverse reaction to anesthesia    MOM-UNSURE WHAT HAPPENED WITH HIS MOM  . Heart murmur   . Hyperlipidemia   . Mitral valve prolapse   . Personal history of colonic polyps = adenoma 01/24/2012   5 mm adenoma removed 01/2012 [facility]le[facility] Purl   . Rectal bleeding    polyp removed 01/19/2012 pre canerous  . Right bundle branch block   . Sleep apnea    USES CPAP    Past Surgical History:  Procedure Laterality Date  . ACHILLES TENDON  SURGERY Right 06/12/2018   Procedure: RIGHT ACHILLES TENDON REPAIR AND EXCISION OF HEEL SPUR;  Surgeon: Melrose Nakayama, MD;  Location: [facility];  Service: Orthopedics;  Laterality: Right;  . CHONDROPLASTY  02/27/2012   Procedure: CHONDROPLASTY;  Surgeon: Alta Corning, MD;  Location: [facility];  Service: Orthopedics;  Laterality: Right;  patella-femoral joint  . CIRCUMCISION N/A 07/10/2018   Procedure: CIRCUMCISION ADULT;  Surgeon: Abbie Sons, MD;  Location: [facility];  Service: Urology;  Laterality: N/A;  MAC with local  . COLONOSCOPY    . EYE SURGERY    . HEEL SPUR SURGERY Bilateral 2016  . KNEE ARTHROSCOPY Left 3/16  . KNEE ARTHROSCOPY Left 03/10/2017   Procedure: ARTHROSCOPY KNEE;  Surgeon: Dorna Leitz, MD;  Location: [facility];  Service: Orthopedics;  Laterality: Left;  . LASIK  2006   bilateral  . NASAL TURBINATE REDUCTION  1996  . VASECTOMY      Family History  Problem Relation Age of Onset  . Multiple sclerosis Mother   . Anesthesia problems Mother   . Hypertension Mother   . Colitis Father        questionable  . Lung cancer Father   . Pulmonary fibrosis Father        stage 4  . Asthma Other        children; seasonal  . Arthritis Son  juvinile rheumatoid  . Lung cancer Paternal Grandfather   . Lung cancer Paternal Uncle   . Colon cancer Neg Hx     Social History   Socioeconomic History  . Marital status: Married    Spouse name: Not on file  . Number of children: 8  . Years of education: 86  . Highest education level: Not on file  Occupational History  . 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 Smoking status: Never Smoker  . Smokeless tobacco: Never Used  Vaping Use  . Vaping Use: Never used  Substance and Sexual Activity  . Alcohol use: Not Currently    Alcohol/week: 0.0 standard drinks    Comment: occasional beer/wine  . Drug use: No  . Sexual activity: Yes  Other Topics Concern  . Not on file  Social  History Narrative   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   Social Determinants of Health   Financial Resource Strain:   . Difficulty of Paying Living Expenses: Not on file  Food Insecurity:   . Worried About Charity fundraiser in the Last Year: Not on file  . Ran Out of Food in the Last Year: Not on file  Transportation Needs:   . Lack of Transportation (Medical): Not on file  . Lack of Transportation (Non-Medical): Not on file  Physical Activity:   . Days of Exercise per Week: Not on file  . Minutes of Exercise per Session: Not on file  Stress:   . Feeling of Stress : Not on file  Social Connections:   . Frequency of Communication with Friends and Family: Not on file  . Frequency of Social Gatherings with Friends and Family: Not on file  . Attends Religious Services: Not on file  . Active Member of Clubs or Organizations: Not on file  . Attends Archivist Meetings: Not on file  . Marital Status: Not on file  Intimate Partner Violence:   . Fear of Current or Ex-Partner: Not on file  . Emotionally Abused: Not on file  . Physically Abused: Not on file  . Sexually Abused: Not on file   Review of Systems  Constitutional: Negative for fatigue.       Wears seat belt  HENT: Negative for dental problem, hearing loss, tinnitus and trouble swallowing.        Keeps up with dentist  Eyes:       Some vision change----side effect from meds? May just need readers  Respiratory: Negative for cough, chest tightness and shortness of breath.   Cardiovascular: Negative for chest pain, palpitations and leg swelling.  Gastrointestinal: Negative for blood in stool.       Mild constipation with wegovy No heartburn  Endocrine: Negative for polydipsia and polyuria.  Genitourinary: Negative for difficulty urinating and urgency.       Some ED--relates to stress  Musculoskeletal: Positive for arthralgias and back pain.  Negative for joint swelling.  Skin:       Has rash on legs---?contact (cortisone cream helps if itching)  Allergic/Immunologic: Negative for environmental allergies and immunocompromised state.  Neurological: Negative for dizziness, syncope and light-headedness.       Headache if misses a meal on the wegovy  Hematological: Negative for adenopathy. Does not bruise/bleed easily.  Psychiatric/Behavioral: Negative for sleep disturbance. The patient is nervous/anxious.  Some initial on edge feelings with first fluoxetine doses---better now       Objective:   Physical Exam Constitutional:      Appearance: Normal appearance.  HENT:     Right Ear: Tympanic membrane, ear canal and external ear normal.     Left Ear: Tympanic membrane, ear canal and external ear normal.     Mouth/Throat:     Pharynx: No oropharyngeal exudate or posterior oropharyngeal erythema.  Eyes:     Conjunctiva/sclera: Conjunctivae normal.     Pupils: Pupils are equal, round, and reactive to light.  Cardiovascular:     Rate and Rhythm: Normal rate and regular rhythm.     Pulses: Normal pulses.     Heart sounds: No murmur heard.  No gallop.   Pulmonary:     Effort: Pulmonary effort is normal.     Breath sounds: Normal breath sounds. No wheezing or rales.  Abdominal:     Palpations: Abdomen is soft.     Tenderness: There is no abdominal tenderness.  Musculoskeletal:     Cervical back: Neck supple.     Right lower leg: No edema.     Left lower leg: No edema.  Lymphadenopathy:     Cervical: No cervical adenopathy.  Skin:    Comments: Slightly raised non specific rash--mostly posterior calves  Neurological:     General: No focal deficit present.     Mental Status: He is alert and oriented to person, place, and time.  Psychiatric:        Mood and Affect: Mood normal.        Behavior: Behavior normal.            Assessment & Plan:

## 2020-09-30 NOTE — Telephone Encounter (Signed)
Contacted pt and advised he should go to ER. Pt said he would like to be seen in office and the pain is not as bad and he is drinking more water and urine is lighter. Pt does report though that he has a hx of kidney stone and the last kidney stone he had he did not get taken care of. Advised if any symptoms worsen or if he has any difficulty urinating he should go immediately to the ER. Made apt for pt on 10/22 with Dr. Diona Browner. Pt verbalized understanding.

## 2020-09-30 NOTE — Telephone Encounter (Signed)
[facility] - Client TELEPHONE ADVICE RECORD [facility] Patient Name: [name] Gender: Male DOB: [birth year] Age: 49 Y 22 M 11 D Return Phone Number: [phone] (Primary), [phone] (Secondary) Address: City/State/Zip: [place] [address] Client [facility] - Client Client Site [facility] - Day Physician Viviana Simpler- MD Contact Type Call Who Is Calling Patient / Member / Family / Caregiver Call Type Triage / Clinical Relationship To Patient Self Return Phone Number [phone] (Primary) Chief Complaint Abdominal Pain Reason for Call Symptomatic / Request for [place] is having back pain and left side pain. Caller said he has frequent urination and felt nauseous this morning. Caller said the pain is worse when he is driving or when he sits down. Caller said he has had kidney stones and issues in the past. Caller said his urine turned brown but as of right now it is normal. Translation No Nurse Assessment Nurse: Hassell Done, RN, Melanie Date/Time (Eastern Time): 09/30/2020 3:12:03 PM Confirm and document reason for call. If symptomatic, describe symptoms. ---Caller states he has flank pain on the L side and he is in a lot of pain when he sits down and drives and hits a pot hole. states he was told he had a kidney stone sevaral years ago on that side. Does the patient have any new or worsening symptoms? ---Yes Will a triage be completed? ---Yes Related visit to physician within the last 2 weeks? ---Yes Does the PT have any chronic conditions? (i.e. diabetes, asthma, this includes High risk factors for pregnancy, etc.) ---No Is this a behavioral health or substance abuse call? ---No Guidelines Guideline Title Affirmed Question Affirmed Notes Nurse Date/Time (Eastern Time) Flank Pain [1] SEVERE pain (e.g., excruciating, scale 8-10) AND [2] present > 1  hour Hassell Done, RN, Threasa Beards 09/30/2020 3:13:50 PM Disp. Time Eilene Ghazi Time) Disposition Final User 09/30/2020 3:17:59 PM Go to ED Now Yes Hassell Done, RN, Melanie PLEASE NOTE: All timestamps contained within this report are represented as [place] Standard Time. CONFIDENTIALTY NOTICE: This fax transmission is intended only for the addressee. It contains information that is legally privileged, confidential or otherwise protected from use or disclosure. If you are not the intended recipient, you are strictly prohibited from reviewing, disclosing, copying using or disseminating any of this information or taking any action in reliance on or regarding this information. If you have received this fax in error, please notify us immediately by telephone so that we can arrange for its return to [place]. Phone: [phone], Toll-Free: [phone], Fax: [phone] Page: 2 of 2 Call Id: [id number] Caller Disagree/Comply Comply Caller Understands Yes PreDisposition Call Doctor Care Advice Given Per Guideline GO TO ED NOW: * You need to be seen in the Emergency Department. ANOTHER ADULT SHOULD DRIVE: * It is better and safer if another adult drives instead of you. CARE ADVICE given per Flank Pain (Adult) guideline. Referrals GO TO FACILITY UNDECIDED

## 2020-10-01 MED ORDER — HYDROCODONE-ACETAMINOPHEN 5-325 MG PO TABS: 1.0000 | ORAL_TABLET | Freq: Four times a day (QID) | ORAL | 0 refills | Status: DC | PRN

## 2020-10-02 ENCOUNTER — Ambulatory Visit: Payer: Medicare Other | Admitting: Family Medicine

## 2021-01-07 NOTE — Telephone Encounter (Signed)
He had insurance Nurse, mental health) when he had his physical but he is being billed $400 for the visit. Can you please figure out what is going on? Thanks!

## 2021-02-22 NOTE — Telephone Encounter (Signed)
Looks like [name] was looking in to it at the end of January. Will forward this note to her.

## 2021-02-25 ENCOUNTER — Telehealth: Payer: Self-pay | Admitting: Cardiovascular Disease

## 2021-02-25 NOTE — Telephone Encounter (Signed)
Patient insurance oon .  Previously declined and will call if he needs to schedule.   Deleting recall.

## 2021-02-26 NOTE — Telephone Encounter (Signed)
I am routing to Donzetta Matters, Office Supervisor to follow up with patient as she has been looking in to the insurance problem.

## 2021-03-30 NOTE — Telephone Encounter (Signed)
Patient's insurance is out of network. After investigation, it looks like there is no way around the charges. Even if we were to not file insurance, patient would still be responsible for full amount as he is not considered self-pay for the discount since he has insurance. I called to let patient know that he will be responsible for the full cost of visits and he may want to find a provider who is in network with his insurance. He wants to continue seeing Dr. Silvio Pate and he is aware that he will be responsible for the charges for his visits. He verbalized understanding and said that he will set up a payment plan with billing to get the charges taken care of.

## 2021-08-09 MED ORDER — KETOCONAZOLE 2 % EX CREA: 1.0000 "application " | TOPICAL_CREAM | Freq: Every day | CUTANEOUS | 0 refills | Status: DC

## 2021-08-23 ENCOUNTER — Telehealth (INDEPENDENT_AMBULATORY_CARE_PROVIDER_SITE_OTHER): Payer: BLUE CROSS/BLUE SHIELD | Admitting: Internal Medicine

## 2021-08-23 ENCOUNTER — Other Ambulatory Visit: Payer: Self-pay

## 2021-08-23 ENCOUNTER — Encounter: Payer: Self-pay | Admitting: Internal Medicine

## 2021-08-23 DIAGNOSIS — U071 COVID-19: Secondary | ICD-10-CM | POA: Diagnosis not present

## 2021-08-23 MED ORDER — NIRMATRELVIR/RITONAVIR (PAXLOVID)TABLET: 3.0000 | ORAL_TABLET | Freq: Two times a day (BID) | ORAL | 0 refills | Status: AC

## 2021-08-23 NOTE — Assessment & Plan Note (Signed)
Relatively mild but at high risk Discussed supportive care Will go ahead with paxlovid after discussing EUA, etc  Must have at least 5 days isolation--and then mask (as long as no symptoms) Discussed possible rebound

## 2021-08-23 NOTE — Progress Notes (Signed)
Subjective:    Patient ID: [name], male    DOB: 11-11-1971, 50 y.o.   MRN: [id number]  HPI Video virtual visit due to COVID infection Identification done Reviewed limitations and billing and he gave consent Participants---patient in his home and I am in my office  Started like the last time he had COVID Felt like it was like sinus symptoms---tried antihistamines Then he got achy, green drainage, headache, sore throat, ear pain Using allergy med  Some chest pain Occasional cough Some SOB---feels it up in his frontal area (like from the congestion)  No fever Still with the aches Using advil--but none today thus far  Did COVID test last night---positive  Current Outpatient Medications on File Prior to Visit  Medication Sig Dispense Refill   Insulin Pen Needle 32G X 4 MM MISC Use once daily with Saxenda injection (Patient not taking: Reported on 08/23/2021) 100 each 3   ketoconazole (NIZORAL) 2 % cream Apply 1 application topically daily. (Patient not taking: Reported on 08/23/2021) 45 g 0   Semaglutide-Weight Management 0.5 MG/0.5ML SOAJ Inject into the skin. (Patient not taking: Reported on 08/23/2021)     No current facility-administered medications on file prior to visit.    No Known Allergies  Past Medical History:  Diagnosis Date   Carpal tunnel syndrome    bileteral - "Minor"   Complication of anesthesia 1996   ran a fever after sinus surgery.  In [place].   Family history of adverse reaction to anesthesia    MOM-UNSURE WHAT HAPPENED WITH HIS MOM   Heart murmur    Hyperlipidemia    Mitral valve prolapse    Personal history of colonic polyps = adenoma 01/24/2012   5 mm adenoma removed 01/2012 - [facility]    Rectal bleeding    polyp removed 01/19/2012 pre canerous   Right bundle branch block    Sleep apnea    USES CPAP    Past Surgical History:  Procedure Laterality Date   ACHILLES TENDON SURGERY Right 06/12/2018   Procedure: RIGHT ACHILLES TENDON REPAIR AND  EXCISION OF HEEL SPUR;  Surgeon: Melrose Nakayama, MD;  Location: [facility];  Service: Orthopedics;  Laterality: Right;   CHONDROPLASTY  02/27/2012   Procedure: CHONDROPLASTY;  Surgeon: Alta Corning, MD;  Location: [facility];  Service: Orthopedics;  Laterality: Right;  patella-femoral joint   CIRCUMCISION N/A 07/10/2018   Procedure: CIRCUMCISION ADULT;  Surgeon: Abbie Sons, MD;  Location: [facility];  Service: Urology;  Laterality: N/A;  MAC with local   COLONOSCOPY     EYE SURGERY     HEEL SPUR SURGERY Bilateral 2016   KNEE ARTHROSCOPY Left 3/16   KNEE ARTHROSCOPY Left 03/10/2017   Procedure: ARTHROSCOPY KNEE;  Surgeon: Dorna Leitz, MD;  Location: [facility];  Service: Orthopedics;  Laterality: Left;   LASIK  2006   bilateral   NASAL TURBINATE REDUCTION  1996   VASECTOMY      Family History  Problem Relation Age of Onset   Multiple sclerosis Mother    Anesthesia problems Mother    Hypertension Mother    Colitis Father        questionable   Lung cancer Father    Pulmonary fibrosis Father        stage 4   Asthma Other        children; seasonal   Arthritis Son        juvinile rheumatoid   Lung cancer Paternal Grandfather    Lung cancer  Paternal Uncle    Colon cancer Neg Hx     Social History   Socioeconomic History   Marital status: Married    Spouse name: Not on file   Number of children: 8   Years of education: 12   Highest education level: Not on file  Occupational History   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   Social Determinants of Health   Financial Resource Strain: Not on file  Food Insecurity: Not on file  Transportation Needs: Not on file  Physical Activity: Not on file  Stress: Not on file  Social Connections: Not on file  Intimate Partner Violence: Not on file   Review of Systems No vomiting or diarrhea     Objective:   Physical Exam Constitutional:      Appearance: Normal appearance.  Pulmonary:     Effort: Pulmonary effort is normal. No respiratory distress.  Neurological:     Mental Status: He is alert.           Assessment & Plan:

## 2021-09-01 MED ORDER — MECLIZINE HCL 25 MG PO TABS: 25.0000 mg | ORAL_TABLET | Freq: Three times a day (TID) | ORAL | 1 refills | Status: DC | PRN

## 2021-10-04 ENCOUNTER — Encounter: Payer: Self-pay | Admitting: Internal Medicine

## 2021-10-04 ENCOUNTER — Ambulatory Visit (INDEPENDENT_AMBULATORY_CARE_PROVIDER_SITE_OTHER): Payer: BLUE CROSS/BLUE SHIELD | Admitting: Internal Medicine

## 2021-10-04 ENCOUNTER — Other Ambulatory Visit: Payer: Self-pay

## 2021-10-04 DIAGNOSIS — L309 Dermatitis, unspecified: Secondary | ICD-10-CM | POA: Diagnosis not present

## 2021-10-04 DIAGNOSIS — Z0000 Encounter for general adult medical examination without abnormal findings: Secondary | ICD-10-CM

## 2021-10-04 MED ORDER — TRIAMCINOLONE ACETONIDE 0.1 % EX CREA: 1.0000 "application " | TOPICAL_CREAM | Freq: Two times a day (BID) | CUTANEOUS | 1 refills | Status: AC | PRN

## 2021-10-04 NOTE — Progress Notes (Signed)
Subjective:    Patient ID: [name], male    DOB: [birth year], 50 y.o.   MRN: [id number]  HPI Here for follow up of obesity This visit occurred during the SARS-CoV-2 public health emergency.  Safety protocols were in place, including screening questions prior to the visit, additional usage of staff PPE, and extensive cleaning of exam room while observing appropriate contact time as indicated for disinfecting solutions.   Continues at Bariatric clinic at [facility] Had been on injections of semaglutide---insurance stopped paying though (so he stopped) Gained back 35# Started tirzepatide--with coupon --and ramping up dose Now is maintaining weight and hopefully will improve with dose increase Not having the same GI side effects thus far--but does note some constipation (opposite of the semaglutide)  Less joint pain with weight loss No regular meds now--rare advil (like for shoulder pain)  COVID infection was mild No problems with the paxlovid  Current Outpatient Medications on File Prior to Visit  Medication Sig Dispense Refill   meclizine (ANTIVERT) 25 MG tablet Take 1 tablet (25 mg total) by mouth 3 (three) times daily as needed for dizziness. 90 tablet 1   tirzepatide (MOUNJARO) 5 MG/0.5ML Pen Inject into the skin.     No current facility-administered medications on file prior to visit.    No Known Allergies  Past Medical History:  Diagnosis Date   Carpal tunnel syndrome    bileteral - "Minor"   Complication of anesthesia 1996   ran a fever after sinus surgery.  In [place].   Family history of adverse reaction to anesthesia    MOM-UNSURE WHAT HAPPENED WITH HIS MOM   Heart murmur    Hyperlipidemia    Mitral valve prolapse    Personal history of colonic polyps = adenoma 01/24/2012   5 mm adenoma removed 01/2012 - [facility]    Rectal bleeding    polyp removed 01/19/2012 pre canerous   Right bundle branch block    Sleep apnea    USES CPAP    Past Surgical History:   Procedure Laterality Date   ACHILLES TENDON SURGERY Right 06/12/2018   Procedure: RIGHT ACHILLES TENDON REPAIR AND EXCISION OF HEEL SPUR;  Surgeon: Melrose Nakayama, MD;  Location: [facility];  Service: Orthopedics;  Laterality: Right;   CHONDROPLASTY  02/27/2012   Procedure: CHONDROPLASTY;  Surgeon: Alta Corning, MD;  Location: [facility];  Service: Orthopedics;  Laterality: Right;  patella-femoral joint   CIRCUMCISION N/A 07/10/2018   Procedure: CIRCUMCISION ADULT;  Surgeon: Abbie Sons, MD;  Location: [facility];  Service: Urology;  Laterality: N/A;  MAC with local   COLONOSCOPY     EYE SURGERY     HEEL SPUR SURGERY Bilateral 2016   KNEE ARTHROSCOPY Left 3/16   KNEE ARTHROSCOPY Left 03/10/2017   Procedure: ARTHROSCOPY KNEE;  Surgeon: Dorna Leitz, MD;  Location: [facility];  Service: Orthopedics;  Laterality: Left;   LASIK  2006   bilateral   NASAL TURBINATE REDUCTION  1996   VASECTOMY      Family History  Problem Relation Age of Onset   Multiple sclerosis Mother    Anesthesia problems Mother    Hypertension Mother    Colitis Father        questionable   Lung cancer Father    Pulmonary fibrosis Father        stage 4   Asthma Other        children; seasonal   Arthritis Son        Ledell Noss  rheumatoid   Lung cancer Paternal Grandfather    Lung cancer Paternal Uncle    Colon cancer Neg Hx     Social History   Socioeconomic History   Marital status: Married    Spouse name: Not on file   Number of children: 8   Years of education: 12   Highest education level: Not on file  Occupational History   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  Cardiovascular:     Rate and Rhythm: Normal rate and regular rhythm.     Pulses: Normal pulses.     Heart sounds: No murmur heard.   No gallop.  Pulmonary:     Effort: Pulmonary effort is normal.     Breath sounds: Normal breath sounds. No wheezing or rales.  Abdominal:     Palpations: Abdomen is soft.     Tenderness: There is no abdominal tenderness.  Musculoskeletal:     Cervical back: Neck supple.     Right lower leg: No edema.     Left lower leg: No edema.  Lymphadenopathy:     Cervical: No cervical adenopathy.  Skin:    Comments: Many benign nevi--especially on back Thick, eczematous area on lateral right calf  Neurological:     General: No focal deficit present.     Mental Status: He is alert and oriented to person, place, and time.  Psychiatric:        Mood and Affect: Mood normal.        Behavior: Behavior normal.           Assessment & Plan:

## 2021-10-04 NOTE — Assessment & Plan Note (Signed)
Will get flu vaccine soon Bivalent COVID next month Colon due 2023 No PSA till 60

## 2021-10-04 NOTE — Assessment & Plan Note (Signed)
Had eucrisa in the past----doesn't seem to be covered by insurance Will try TAC---switch in ineffective

## 2021-10-04 NOTE — Assessment & Plan Note (Signed)
Now starting tirzepatide Less side effects thus far

## 2021-12-13 ENCOUNTER — Encounter: Payer: Self-pay | Admitting: Internal Medicine

## 2021-12-14 ENCOUNTER — Telehealth: Payer: Self-pay

## 2021-12-14 MED ORDER — SILVER SULFADIAZINE 1 % EX CREA: 1.0000 "application " | TOPICAL_CREAM | Freq: Every day | CUTANEOUS | 0 refills | Status: DC

## 2021-12-14 NOTE — Telephone Encounter (Signed)
LMTCB

## 2021-12-14 NOTE — Telephone Encounter (Signed)
[place] Night - Client Nonclinical Telephone Record  [facility] Client [facility] Night - Client Client Site [place] - Night Provider AA - PHYSICIAN, Verita Schneiders- MD Contact Type Call Who Is Calling Patient / Member / Family / Caregiver Caller Name Vanderbilt Ranieri Caller Phone Number [phone] Patient Name [name] Patient DOB 1971/02/01 Call Type Message Only Information Provided Reason for Call Request for General Office Information Initial Comment Caller states wants to make an appt. Additional Comment wants to verify office takes his insurance and can make an appt Disp. Time Disposition Final User 12/13/2021 3:09:29 PM General Information Provided Yes Virgilio Belling Call Closed By: Virgilio Belling Transaction Date/Time: 12/13/2021 3:05:17 PM (ET

## 2022-02-24 ENCOUNTER — Telehealth: Payer: Self-pay

## 2022-02-24 NOTE — Addendum Note (Signed)
Addended by: Viviana Simpler I on: 02/24/2022 04:48 PM ? ? Modules accepted: Orders ? ?

## 2022-02-24 NOTE — Telephone Encounter (Signed)
Patient called stating when he saw Dr Silvio Pate last he had insurance that was not in the network and he could not get his lab work done as part of his CPE due to coverage. He has [place] insurance right now that will expire at the end of March. He would like to go ahead and come in for labs to be done if possible, made an appointment for 03/04/22 for now. He also wanted to get help knowing if he can come here to get that done and if that would be in network. I added [employer] in his chart and it looks to be in network. I advised patient that we do take this insurance. Please let patient know if ok for labs and if we can verify the in network part for the patient. Thank you  ?

## 2022-02-28 NOTE — Telephone Encounter (Signed)
I am going to send to Amy to look in to his insurance more to make sure he is in network. He can be scheduled for a CPE after that. ?

## 2022-03-04 ENCOUNTER — Other Ambulatory Visit: Payer: Self-pay

## 2022-03-04 ENCOUNTER — Other Ambulatory Visit (INDEPENDENT_AMBULATORY_CARE_PROVIDER_SITE_OTHER): Payer: Managed Care, Other (non HMO)

## 2022-03-04 DIAGNOSIS — R7303 Prediabetes: Secondary | ICD-10-CM

## 2022-03-04 LAB — COMPREHENSIVE METABOLIC PANEL
ALT: 28 U/L (ref 0–53)
AST: 24 U/L (ref 0–37)
Albumin: 4.4 g/dL (ref 3.5–5.2)
Alkaline Phosphatase: 55 U/L (ref 39–117)
BUN: 20 mg/dL (ref 6–23)
CO2: 28 mEq/L (ref 19–32)
Calcium: 8.8 mg/dL (ref 8.4–10.5)
Chloride: 108 mEq/L (ref 96–112)
Creatinine, Ser: 0.95 mg/dL (ref 0.40–1.50)
GFR: 94.35 mL/min (ref >60.00)
Glucose, Bld: 87 mg/dL (ref 70–99)
Potassium: 3.7 mEq/L (ref 3.5–5.1)
Sodium: 141 mEq/L (ref 135–145)
Total Bilirubin: 0.6 mg/dL (ref 0.2–1.2)
Total Protein: 6.9 g/dL (ref 6.0–8.3)

## 2022-03-04 LAB — LIPID PANEL
Cholesterol: 154 mg/dL (ref 0–200)
HDL: 41.2 mg/dL (ref >39.00)
LDL Cholesterol: 97 mg/dL (ref 0–99)
NonHDL: 112.75
Total CHOL/HDL Ratio: 4
Triglycerides: 80 mg/dL (ref 0.0–149.0)
VLDL: 16 mg/dL (ref 0.0–40.0)

## 2022-03-04 LAB — CBC
HCT: 41.9 % (ref 39.0–52.0)
Hemoglobin: 14.4 g/dL (ref 13.0–17.0)
MCHC: 34.3 g/dL (ref 30.0–36.0)
MCV: 91.6 fl (ref 78.0–100.0)
Platelets: 228 10*3/uL (ref 150.0–400.0)
RBC: 4.58 Mil/uL (ref 4.22–5.81)
RDW: 12.7 % (ref 11.5–15.5)
WBC: 5.7 10*3/uL (ref 4.0–10.5)

## 2022-03-04 LAB — HEMOGLOBIN A1C: Hgb A1c MFr Bld: 5.3 % (ref 4.6–6.5)

## 2022-03-08 LAB — T4, FREE: Free T4: 0.9 ng/dL (ref 0.60–1.60)

## 2022-08-29 ENCOUNTER — Encounter: Payer: Self-pay | Admitting: Internal Medicine

## 2022-09-27 ENCOUNTER — Ambulatory Visit: Payer: Self-pay | Admitting: Family Medicine

## 2022-12-02 ENCOUNTER — Ambulatory Visit: Payer: Self-pay | Admitting: Nurse Practitioner

## 2022-12-15 ENCOUNTER — Encounter: Payer: Self-pay | Admitting: Internal Medicine

## 2022-12-19 ENCOUNTER — Encounter: Payer: Self-pay | Admitting: Internal Medicine

## 2022-12-19 DIAGNOSIS — H9193 Unspecified hearing loss, bilateral: Secondary | ICD-10-CM

## 2022-12-19 DIAGNOSIS — Z86010 Personal history of colonic polyps: Secondary | ICD-10-CM

## 2022-12-23 ENCOUNTER — Encounter: Payer: Self-pay | Admitting: *Deleted

## 2022-12-23 ENCOUNTER — Encounter: Payer: Self-pay | Admitting: Internal Medicine

## 2022-12-23 NOTE — Telephone Encounter (Signed)
Error

## 2022-12-23 NOTE — Telephone Encounter (Signed)
We are working on getting this faxed. The order was just placed 12/21/2022. Pt will be notified once the referral has been faxed.

## 2022-12-30 ENCOUNTER — Telehealth: Payer: Self-pay

## 2022-12-30 NOTE — Telephone Encounter (Signed)
Called and informed pt that a message was sent to his mychart about insurance coverage.

## 2023-06-12 ENCOUNTER — Encounter: Payer: Self-pay | Admitting: Internal Medicine

## 2023-07-03 LAB — HM COLONOSCOPY

## 2023-09-06 ENCOUNTER — Ambulatory Visit: Payer: BLUE CROSS/BLUE SHIELD | Admitting: Internal Medicine

## 2023-11-06 ENCOUNTER — Encounter: Payer: Self-pay | Admitting: Internal Medicine

## 2023-11-06 ENCOUNTER — Ambulatory Visit (INDEPENDENT_AMBULATORY_CARE_PROVIDER_SITE_OTHER): Payer: Managed Care, Other (non HMO) | Admitting: Internal Medicine

## 2023-11-06 ENCOUNTER — Ambulatory Visit (INDEPENDENT_AMBULATORY_CARE_PROVIDER_SITE_OTHER)
Admission: RE | Admit: 2023-11-06 | Discharge: 2023-11-06 | Disposition: A | Discharge status: Home / Self Care | Payer: BLUE CROSS/BLUE SHIELD | Source: Ambulatory Visit | Attending: Internal Medicine | Admitting: Internal Medicine

## 2023-11-06 VITALS — BP 110/76 | HR 80 | Temp 98.1°F | Ht 75.0 in | Wt 286.0 lb

## 2023-11-06 DIAGNOSIS — J189 Pneumonia, unspecified organism: Secondary | ICD-10-CM | POA: Diagnosis not present

## 2023-11-06 MED ORDER — DOXYCYCLINE HYCLATE 100 MG PO TABS: 100.0000 mg | ORAL_TABLET | Freq: Two times a day (BID) | ORAL | 0 refills | Status: AC

## 2023-11-06 MED ORDER — PREDNISONE 20 MG PO TABS: 40.0000 mg | ORAL_TABLET | Freq: Every day | ORAL | 0 refills | Status: AC

## 2023-11-06 NOTE — Assessment & Plan Note (Addendum)
Could be Mycoplasma infection but no CXR Didn't seem to respond to z-pak Still some wheezing  CXR shows possible slight lingular atelectasis. ??increased markings--but no lobar pneumonia  Will treat with prednisone 40 x 3, 20 x 3 Doxy 100 bid x 7 days Stop the augmentin Discussed the natural history of eventual resolution with or without Rx

## 2023-11-06 NOTE — Progress Notes (Signed)
Subjective:    Patient ID: [name], male    DOB: 07-02-1971, 52 y.o.   MRN: [id number]  HPI Here for follow up of urgent care visit from 11/18 Diagnosed with community acquired pneumonia  Noticed evening fever at the beginning of the month Persisted  and felt run down Then started with sweats and felt worse No cough or SOB Does feel some mucus in his chest Achy if he misses tylenol/advil  Then he went to urgent care Got duoneb in clinic No CXR--just heard wheezing and crackles on left  Given Rx for augmentin and azithromycin Still on the augmentin Has albuterol inhaler---using at night and occasionally during the day (does seem to help)  Doesn't feel much better Still has fever--if he doesn't take the analgesic Some SOB---with activity Tries to rest some while at work--but same hours  Current Outpatient Medications on File Prior to Visit  Medication Sig Dispense Refill   buPROPion (WELLBUTRIN XL) 150 MG 24 hr tablet Take 150 mg by mouth daily.     FLUoxetine (PROZAC) 20 MG capsule Take 60 mg by mouth daily.     triamcinolone cream (KENALOG) 0.1 % Apply 1 application topically 2 (two) times daily as needed. 45 g 1   No current facility-administered medications on file prior to visit.    No Known Allergies  Past Medical History:  Diagnosis Date   Carpal tunnel syndrome    bileteral - "Minor"   Complication of anesthesia 1996   ran a fever after sinus surgery.  In [place].   Family history of adverse reaction to anesthesia    MOM-UNSURE WHAT HAPPENED WITH HIS MOM   Heart murmur    Hyperlipidemia    Mitral valve prolapse    Personal history of colonic polyps = adenoma 01/24/2012   5 mm adenoma removed 01/2012 - [facility]    Rectal bleeding    polyp removed 01/19/2012 pre canerous   Right bundle branch block    Sleep apnea    USES CPAP    Past Surgical History:  Procedure Laterality Date   ACHILLES TENDON SURGERY Right 06/12/2018   Procedure: RIGHT ACHILLES  TENDON REPAIR AND EXCISION OF HEEL SPUR;  Surgeon: Marcene Corning, MD;  Location: [facility] OR;  Service: Orthopedics;  Laterality: Right;   CHONDROPLASTY  02/27/2012   Procedure: CHONDROPLASTY;  Surgeon: Harvie Junior, MD;  Location: [facility] OR;  Service: Orthopedics;  Laterality: Right;  patella-femoral joint   CIRCUMCISION N/A 07/10/2018   Procedure: CIRCUMCISION ADULT;  Surgeon: Riki Altes, MD;  Location: [facility] ORS;  Service: Urology;  Laterality: N/A;  MAC with local   COLONOSCOPY     EYE SURGERY     HEEL SPUR SURGERY Bilateral 2016   KNEE ARTHROSCOPY Left 3/16   KNEE ARTHROSCOPY Left 03/10/2017   Procedure: ARTHROSCOPY KNEE;  Surgeon: Jodi Geralds, MD;  Location: [facility] OR;  Service: Orthopedics;  Laterality: Left;   LASIK  2006   bilateral   NASAL TURBINATE REDUCTION  1996   VASECTOMY      Family History  Problem Relation Age of Onset   Multiple sclerosis Mother    Anesthesia problems Mother    Hypertension Mother    Colitis Father        questionable   Lung cancer Father    Pulmonary fibrosis Father        stage 4   Asthma Other        children; seasonal   Arthritis Son  juvinile rheumatoid   Lung cancer Paternal Grandfather    Lung cancer Paternal Uncle    Colon cancer Neg Hx     Social History   Socioeconomic History   Marital status: Married    Spouse name: Not on file   Number of children: 8   Years of education: 12   Highest education level: Not on file  Occupational History   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   Social Determinants of Health   Financial Resource Strain: Low Risk  (04/24/2023)   Received from [facility], [facility]   Overall Financial Resource Strain (CARDIA)    Difficulty of Paying Living Expenses: Not hard at all  Food Insecurity: No Food Insecurity (04/24/2023)   Received from [facility], [facility]   Hunger Vital Sign    Worried About Running Out of Food in the Last Year: Never true    Ran Out of Food in the Last Year: Never true  Transportation Needs: No Transportation Needs (04/24/2023)   Received from [facility], [facility]   PRAPARE - Transportation    Lack of Transportation (Medical): No    Lack of Transportation (Non-Medical): No  Physical Activity: Not on file  Stress: Not on file  Social Connections: Unknown (04/24/2022)   Received from [facility]   Social Network    Social Network: Not on file  Intimate Partner Violence: Unknown (03/16/2022)   Received from [facility]   HITS    Physically Hurt: Not on file    Insult or Talk Down To: Not on file    Threaten Physical Harm: Not on file    Scream or Curse: Not on file   Review of Systems No N/V Able to eat Off the bariatric meds     Objective:   Physical Exam Constitutional:      Appearance: Normal appearance.  Cardiovascular:     Rate and Rhythm: Normal rate and regular rhythm.     Heart sounds: No murmur heard.    No gallop.  Pulmonary:     Effort: Pulmonary effort is normal.     Breath sounds: No rales.     Comments: Slightly reduced breath sounds No crackles Mild exp wheezes and slightly prolonged exp phase Musculoskeletal:     Cervical back: Neck supple.  Lymphadenopathy:     Cervical: No cervical adenopathy.  Neurological:     Mental Status: He is alert.            Assessment & Plan:

## 2024-01-10 ENCOUNTER — Encounter: Payer: Self-pay | Admitting: Internal Medicine

## 2024-03-06 ENCOUNTER — Encounter: Payer: Self-pay | Admitting: Internal Medicine

## 2024-03-06 MED ORDER — AZITHROMYCIN 250 MG PO TABS: ORAL_TABLET | ORAL | 0 refills | Status: AC

## 2024-04-08 DIAGNOSIS — L309 Dermatitis, unspecified: Secondary | ICD-10-CM | POA: Diagnosis not present
